# Patient Record
Sex: Female | Born: 1950 | Race: White | Hispanic: No | Marital: Married | Smoking: Never smoker
Health system: Southern US, Community
[De-identification: ages and names within clinical notes are randomized; demographics above are authoritative.]

## PROBLEM LIST (undated history)

## (undated) DIAGNOSIS — F32A Depression, unspecified: Secondary | ICD-10-CM

## (undated) DIAGNOSIS — T8859XA Other complications of anesthesia, initial encounter: Secondary | ICD-10-CM

## (undated) DIAGNOSIS — M1990 Unspecified osteoarthritis, unspecified site: Secondary | ICD-10-CM

## (undated) DIAGNOSIS — K219 Gastro-esophageal reflux disease without esophagitis: Secondary | ICD-10-CM

## (undated) DIAGNOSIS — G9389 Other specified disorders of brain: Secondary | ICD-10-CM

## (undated) DIAGNOSIS — J189 Pneumonia, unspecified organism: Secondary | ICD-10-CM

## (undated) DIAGNOSIS — E785 Hyperlipidemia, unspecified: Secondary | ICD-10-CM

## (undated) DIAGNOSIS — R51 Headache: Secondary | ICD-10-CM

## (undated) DIAGNOSIS — M549 Dorsalgia, unspecified: Secondary | ICD-10-CM

## (undated) DIAGNOSIS — M797 Fibromyalgia: Secondary | ICD-10-CM

## (undated) DIAGNOSIS — R519 Headache, unspecified: Secondary | ICD-10-CM

## (undated) DIAGNOSIS — I10 Essential (primary) hypertension: Secondary | ICD-10-CM

## (undated) DIAGNOSIS — K5190 Ulcerative colitis, unspecified, without complications: Secondary | ICD-10-CM

## (undated) DIAGNOSIS — F329 Major depressive disorder, single episode, unspecified: Secondary | ICD-10-CM

## (undated) DIAGNOSIS — T4145XA Adverse effect of unspecified anesthetic, initial encounter: Secondary | ICD-10-CM

## (undated) DIAGNOSIS — G8929 Other chronic pain: Secondary | ICD-10-CM

## (undated) DIAGNOSIS — E890 Postprocedural hypothyroidism: Secondary | ICD-10-CM

## (undated) HISTORY — PX: WISDOM TOOTH EXTRACTION: SHX21

## (undated) HISTORY — PX: REDUCTION MAMMAPLASTY: SUR839

## (undated) HISTORY — DX: Essential (primary) hypertension: I10

## (undated) HISTORY — DX: Ulcerative colitis, unspecified, without complications: K51.90

## (undated) HISTORY — PX: UTERINE SUSPENSION: SUR1430

## (undated) HISTORY — DX: Fibromyalgia: M79.7

## (undated) HISTORY — PX: APPENDECTOMY: SHX54

## (undated) HISTORY — DX: Postprocedural hypothyroidism: E89.0

## (undated) HISTORY — DX: Unspecified osteoarthritis, unspecified site: M19.90

## (undated) HISTORY — DX: Hyperlipidemia, unspecified: E78.5

## (undated) HISTORY — PX: COLONOSCOPY W/ BIOPSIES AND POLYPECTOMY: SHX1376

## (undated) HISTORY — DX: Gastro-esophageal reflux disease without esophagitis: K21.9

## (undated) HISTORY — DX: Other specified disorders of brain: G93.89

## (undated) HISTORY — PX: TONSILLECTOMY: SUR1361

## (undated) HISTORY — PX: BREAST REDUCTION SURGERY: SHX8

## (undated) HISTORY — PX: LUMBAR DISC SURGERY: SHX700

---

## 1974-06-22 DIAGNOSIS — K5190 Ulcerative colitis, unspecified, without complications: Secondary | ICD-10-CM

## 1974-06-22 HISTORY — DX: Ulcerative colitis, unspecified, without complications: K51.90

## 2005-06-22 HISTORY — PX: BREAST REDUCTION SURGERY: SHX8

## 2013-07-19 ENCOUNTER — Ambulatory Visit (INDEPENDENT_AMBULATORY_CARE_PROVIDER_SITE_OTHER): Payer: 59 | Admitting: Family Medicine

## 2013-07-19 ENCOUNTER — Encounter: Payer: Self-pay | Admitting: Family Medicine

## 2013-07-19 VITALS — BP 160/85 | HR 78 | Temp 99.1°F | Ht 60.0 in | Wt 145.0 lb

## 2013-07-19 DIAGNOSIS — R5383 Other fatigue: Secondary | ICD-10-CM

## 2013-07-19 DIAGNOSIS — K5190 Ulcerative colitis, unspecified, without complications: Secondary | ICD-10-CM

## 2013-07-19 DIAGNOSIS — F430 Acute stress reaction: Secondary | ICD-10-CM

## 2013-07-19 DIAGNOSIS — B070 Plantar wart: Secondary | ICD-10-CM

## 2013-07-19 DIAGNOSIS — E039 Hypothyroidism, unspecified: Secondary | ICD-10-CM

## 2013-07-19 DIAGNOSIS — R5381 Other malaise: Secondary | ICD-10-CM

## 2013-07-19 DIAGNOSIS — I10 Essential (primary) hypertension: Secondary | ICD-10-CM | POA: Insufficient documentation

## 2013-07-19 DIAGNOSIS — M797 Fibromyalgia: Secondary | ICD-10-CM

## 2013-07-19 DIAGNOSIS — IMO0001 Reserved for inherently not codable concepts without codable children: Secondary | ICD-10-CM

## 2013-07-19 DIAGNOSIS — E785 Hyperlipidemia, unspecified: Secondary | ICD-10-CM | POA: Insufficient documentation

## 2013-07-19 DIAGNOSIS — E890 Postprocedural hypothyroidism: Secondary | ICD-10-CM

## 2013-07-19 LAB — BASIC METABOLIC PANEL
BUN: 12 mg/dL (ref 6–23)
CO2: 33 mEq/L — ABNORMAL HIGH (ref 19–32)
Calcium: 9.7 mg/dL (ref 8.4–10.5)
Chloride: 99 mEq/L (ref 96–112)
Creat: 0.6 mg/dL (ref 0.50–1.10)
Glucose, Bld: 79 mg/dL (ref 70–99)
Potassium: 3.8 mEq/L (ref 3.5–5.3)
SODIUM: 138 meq/L (ref 135–145)

## 2013-07-19 NOTE — Assessment & Plan Note (Signed)
Obtaining TSH today.

## 2013-07-19 NOTE — Patient Instructions (Signed)
It was nice to see you today.  Please schedule an appt. Regarding your warts.  Please be sure to take Calcium (1200 mg) and Vitamin D (800 IU) daily.

## 2013-07-19 NOTE — Assessment & Plan Note (Signed)
PHQ-9 = 8. Recent stressor with child being ill and recent relocation. Not impacting function/relationships. Will continue to monitor closely.

## 2013-07-19 NOTE — Assessment & Plan Note (Signed)
No treatment currently. Obtaining fasting lipid panel.

## 2013-07-19 NOTE — Assessment & Plan Note (Signed)
Patient reports long history of UC. Now symptom free for approximately 1 year. Patient to see GI later this month.

## 2013-07-19 NOTE — Progress Notes (Signed)
   Subjective:    Patient ID: Erika Osborn, female    DOB: [birth year], 63 y.o.   MRN: [id number]  HPI Erika Osborn is a 63 year old female who presents to establish care.  Current complaints: Depression/feeling down, Plantar warts.  1) Feeling Down, ? Depression - Reports feeling "stressed" and down since relocating to the area.  - She reports fatigue/feeling tired, and difficulty sleeping.   - She reports that he daughter has also recently been sick, and this has been troubling her. - No interference with daily activities/relationships. - She states "I'm dealing with it"  2) Plantar warts - Patient reports she has 3-4 plantar warts which are now bothering her quite a bit. - She reports that she's had this in the past before; previously treated with "burning" and laser treatment. - She reports that they're now bothersome particularly with ambulation.  PMH, Surgical Hx, Social Hx, Family Hx, Meds/Allergies reviewed (see below). Past Medical History  Diagnosis Date  . Arthritis   . GERD (gastroesophageal reflux disease)   . HTN (hypertension)   . HLD (hyperlipidemia)   . Fibromyalgia   . Hypothyroidism following radioiodine therapy   . Ulcerative colitis 1976   History   Social History  . Marital Status: Married    Spouse Name: N/A    Number of Children: N/A  . Years of Education: N/A   Occupational History  . Unemployed    Social History Main Topics  . Smoking status: Never Smoker   . Smokeless tobacco: Not on file  . Alcohol Use: No  . Drug Use: No  . Sexual Activity: Yes    Partners: Male     Comment: Married.   Other Topics Concern  . Not on file   Social History Narrative  . No narrative on file   Family History  Problem Relation Age of Onset  . Cancer Father   . Heart failure Mother   . Hyperlipidemia Mother   . Hyperlipidemia Brother   . Hyperlipidemia Sister   . Osteoporosis Mother    Current outpatient prescriptions:cetirizine (ZYRTEC) 10 MG  tablet, Take 10 mg by mouth daily., Disp: , Rfl: ;  levothyroxine (SYNTHROID, LEVOTHROID) 100 MCG tablet, Take 100 mcg by mouth daily., Disp: , Rfl: ;  quinapril-hydrochlorothiazide (ACCURETIC) 20-12.5 MG per tablet, Take 1 tablet by mouth daily., Disp: , Rfl: ;  ranitidine (ZANTAC) 150 MG capsule, Take 150 mg by mouth daily., Disp: , Rfl:  traMADol (ULTRAM) 50 MG tablet, Take 50-100 mg by mouth every 6 (six) hours as needed., Disp: , Rfl:   Allergies  Allergen Reactions  . Penicillins   . Remicade [Infliximab]    Review of Systems Reports stress, muscle cramps/aches, joint pain, nausea    Objective:   Physical Exam Exam: General: well appearing female in NAD.  Cardiovascular: RRR. No murmurs, rubs, or gallops. Respiratory: CTAB. No rales, rhonchi, or wheeze. Abdomen: soft, nontender, nondistended. Extremities: No LE edema. Crepitus with knee extension. Skin: 3 plantar warts noted (2 on Left, 1 on right foot)    Assessment & Plan:  See Problem List

## 2013-07-19 NOTE — Assessment & Plan Note (Signed)
Patient to return for cryo treatment.

## 2013-07-19 NOTE — Assessment & Plan Note (Signed)
BP elevated today @ 160/85. Refilled home medication - Quinapril-HCTZ May need to increase or add on therapy if pressure remains elevated.

## 2013-07-20 ENCOUNTER — Encounter: Payer: Self-pay | Admitting: Family Medicine

## 2013-07-20 LAB — CBC
HCT: 38.2 % (ref 36.0–46.0)
Hemoglobin: 12.9 g/dL (ref 12.0–15.0)
MCH: 31.5 pg (ref 26.0–34.0)
MCHC: 33.8 g/dL (ref 30.0–36.0)
MCV: 93.2 fL (ref 78.0–100.0)
Platelets: 289 10*3/uL (ref 150–400)
RBC: 4.1 MIL/uL (ref 3.87–5.11)
RDW: 13.6 % (ref 11.5–15.5)
WBC: 5.1 10*3/uL (ref 4.0–10.5)

## 2013-07-20 LAB — TSH: TSH: 2.208 u[IU]/mL (ref 0.350–4.500)

## 2013-07-24 ENCOUNTER — Other Ambulatory Visit: Payer: 59

## 2013-07-24 DIAGNOSIS — E785 Hyperlipidemia, unspecified: Secondary | ICD-10-CM

## 2013-07-24 LAB — LIPID PANEL
Cholesterol: 284 mg/dL — ABNORMAL HIGH (ref 0–200)
HDL: 53 mg/dL (ref >39)
LDL CALC: 198 mg/dL — AB (ref 0–99)
Total CHOL/HDL Ratio: 5.4 Ratio
Triglycerides: 164 mg/dL — ABNORMAL HIGH (ref <150)
VLDL: 33 mg/dL (ref 0–40)

## 2013-07-24 NOTE — Progress Notes (Signed)
[place]

## 2013-07-27 ENCOUNTER — Encounter: Payer: Self-pay | Admitting: Family Medicine

## 2013-08-01 ENCOUNTER — Encounter: Payer: Self-pay | Admitting: Family Medicine

## 2013-08-01 ENCOUNTER — Ambulatory Visit (INDEPENDENT_AMBULATORY_CARE_PROVIDER_SITE_OTHER): Payer: 59 | Admitting: Family Medicine

## 2013-08-01 VITALS — BP 154/70 | HR 75 | Temp 98.7°F | Wt 146.0 lb

## 2013-08-01 DIAGNOSIS — B070 Plantar wart: Secondary | ICD-10-CM

## 2013-08-01 NOTE — Progress Notes (Addendum)
   Subjective:    Patient ID: Cloa Bushong, female    DOB: 02/16/1951, 63 y.o.   MRN: [id number]  HPI Mrs. Standlee presents today for plantar wart removal. - Warts have been present for months and are causing pain with ambulation. - She desires removal today.  Review of Systems Per HPI    Objective:   Physical Exam Filed Vitals:   08/01/13 1329  BP: 154/70  Pulse: 75  Temp: 98.7 F (37.1 C)   Skin/Extremities: 2 plantar warts with overlying callus noted on the left foot (ball of the foot).  1 plantar wart with overlying callus noted.    Assessment & Plan:  Patient counseled on the risks and benefits of the procedure. Consent formed signed. Area prepped with alcohol. Scalpel (#11 & #15) as well as a small curette was used to remove overlying callus and get down to underlying normal tissue.  This was done for all 3 plantar warts (2 on left foot and 1 on the right). Cryogun was then used to freeze the affected areas (done for 3 secs x 2 for each affected area). Patient tolerated the procedure well with minimal pain.

## 2013-08-01 NOTE — Addendum Note (Signed)
Addended by: Coral Spikes on: 08/01/2013 02:25 PM   Modules accepted: Level of Service

## 2013-09-10 ENCOUNTER — Other Ambulatory Visit: Payer: Self-pay | Admitting: Family Medicine

## 2013-10-13 ENCOUNTER — Telehealth: Payer: Self-pay | Admitting: Family Medicine

## 2013-10-13 MED ORDER — TRAMADOL HCL 50 MG PO TABS: 50.0000 mg | ORAL_TABLET | Freq: Four times a day (QID) | ORAL | Status: DC | PRN

## 2013-10-13 NOTE — Telephone Encounter (Signed)
Needs refill on tramadol phamary- [facility] [address]

## 2013-10-13 NOTE — Telephone Encounter (Signed)
Rx is up front ready for pick up.

## 2013-10-16 ENCOUNTER — Other Ambulatory Visit: Payer: Self-pay | Admitting: Family Medicine

## 2013-10-16 NOTE — Telephone Encounter (Signed)
Pt called and needs a refill on her Tramadol left up front for pickup. jw

## 2013-10-17 ENCOUNTER — Other Ambulatory Visit: Payer: Self-pay | Admitting: Family Medicine

## 2013-10-17 MED ORDER — TRAMADOL HCL 50 MG PO TABS: 50.0000 mg | ORAL_TABLET | Freq: Four times a day (QID) | ORAL | Status: DC | PRN

## 2013-10-18 ENCOUNTER — Other Ambulatory Visit: Payer: Self-pay | Admitting: Family Medicine

## 2013-10-18 MED ORDER — TRAMADOL HCL 50 MG PO TABS: 100.0000 mg | ORAL_TABLET | Freq: Four times a day (QID) | ORAL | Status: DC | PRN

## 2013-10-19 ENCOUNTER — Telehealth: Payer: Self-pay | Admitting: Family Medicine

## 2013-10-19 NOTE — Telephone Encounter (Signed)
Pt called because the pharmacy did not fill the prescription correctly. They said it is 1 tablet every 6 hours as needed and they would only fill it at 100 qty. The prescription should be 2 tablets every 6 hours or 4 times a day. Can we call and fix this or does she have to pick up a new prescription. jw

## 2013-10-22 NOTE — Telephone Encounter (Signed)
A recent Rx for the correct amount was placed up front. She should not need a new prescription because I provided a correct one.

## 2013-10-23 NOTE — Telephone Encounter (Signed)
Relayed message,patient states she already picked it up.[place]

## 2014-01-05 ENCOUNTER — Other Ambulatory Visit: Payer: Self-pay | Admitting: Family Medicine

## 2014-01-17 ENCOUNTER — Ambulatory Visit (INDEPENDENT_AMBULATORY_CARE_PROVIDER_SITE_OTHER): Payer: 59 | Admitting: Family Medicine

## 2014-01-17 VITALS — BP 143/74 | HR 83 | Temp 98.8°F | Ht 60.0 in | Wt 145.0 lb

## 2014-01-17 DIAGNOSIS — J309 Allergic rhinitis, unspecified: Secondary | ICD-10-CM

## 2014-01-17 DIAGNOSIS — E890 Postprocedural hypothyroidism: Secondary | ICD-10-CM

## 2014-01-17 DIAGNOSIS — M25512 Pain in left shoulder: Secondary | ICD-10-CM

## 2014-01-17 DIAGNOSIS — H1013 Acute atopic conjunctivitis, bilateral: Secondary | ICD-10-CM

## 2014-01-17 DIAGNOSIS — H1010 Acute atopic conjunctivitis, unspecified eye: Secondary | ICD-10-CM | POA: Insufficient documentation

## 2014-01-17 DIAGNOSIS — M25519 Pain in unspecified shoulder: Secondary | ICD-10-CM

## 2014-01-17 MED ORDER — FLUTICASONE PROPIONATE 50 MCG/ACT NA SUSP: 2.0000 | Freq: Every day | NASAL | Status: DC

## 2014-01-17 MED ORDER — METHYLPREDNISOLONE ACETATE 40 MG/ML IJ SUSP
40.0000 mg | Freq: Once | INTRAMUSCULAR | Status: AC
  Administered 2014-01-17: 40 mg via INTRA_ARTICULAR

## 2014-01-17 MED ORDER — OLOPATADINE HCL 0.2 % OP SOLN: 1.0000 [drp] | Freq: Every day | OPHTHALMIC | Status: DC

## 2014-01-17 NOTE — Assessment & Plan Note (Signed)
Symptoms and physical exam consistent with rotator cuff tendinopathy. Injection performed today. Patient to follow up closely w/ me.  If fails to have significant improvement will likely need MSK US/PT and potentially MRI.

## 2014-01-17 NOTE — Assessment & Plan Note (Signed)
Rechecking TSH today.

## 2014-01-17 NOTE — Progress Notes (Signed)
   Subjective:    Patient ID: Erika Osborn, female    DOB: 1950/07/01, 63 y.o.   MRN: [id number]  HPI 63 year old female with HTN, HLD, & Hypothyroidism post iodine ablation presents for follow up.  Concerns today:  1) Allergies - Patient reports that she has been bothered by allergies since Friday (~5 days). - Symptoms: itchy watery eyes, runny nose. - She takes daily Zyrtec but this has not resolved her symptoms. She has also used Benadryl with a mild improvement. - No recent fever, chills, cough, SOB.  2) Hypothyroidism - Patient noted increasing fatigue and trouble sleeping. - She states that she "doesn't feel quite right" - Desires TSH today.  Other symptoms: - Weight changes: patient reports weight gain (no real change in documented clinic weights). - Skin Changes: None  - Palpitations: None  - Heat/Cold intolerance: + Cold intoleranc  Medication - Currently on Synthroid 100 mcg Compliance:  Yes  Last TSH:   Lab Results  Component Value Date   TSH 2.208 07/19/2013   3) Shoulder Pain - Left - Patient reports a prior hx of shoulder pain. Underwent PT last time this occurred. - She states she has had pain for quite some time (weeks to months) - Pain is moderate to severe and located diffusely around the shoulder.  Worse at night and with overhead activity. - No recent fall/trauma/injury. - No relieving factors.  - No interventions tried.  Social Hx: History   Social History  . Marital Status: Married    Spouse Name: N/A    Number of Children: N/A  . Years of Education: N/A   Occupational History  . Unemployed    Social History Main Topics  . Smoking status: Never Smoker   . Smokeless tobacco: Not on file  . Alcohol Use: No  . Drug Use: No  . Sexual Activity: Yes    Partners: Male     Comment: Married.   Other Topics Concern  . Not on file   Social History Narrative  . No narrative on file   Review of Systems Per HPI    Objective:   Physical  Exam Filed Vitals:   01/17/14 1438  BP: 143/74  Pulse: 83  Temp: 98.8 F (37.1 C)   Exam: General: well appearing, NAD. Neck: No palpable thyromegaly. Cardiovascular: RRR. No murmurs, rubs, or gallops. Respiratory: CTAB. No rales, rhonchi, or wheeze. Extremities: No LE edema. Skin: Warm, dry, intact. No rash.  MSK Shoulder: Left Inspection reveals no abnormalities, atrophy or asymmetry. Palpation is normal with no tenderness over AC joint or bicipital groove. ROM - significant decreased in flexion (lacking ~ 20 degrees). Rotator cuff strength - 4/5 Supraspinatus. Rest = normal. Negative Empty can. + Hawkins. + Painful arc.  Consent obtained and verified. Time-out conducted. Noted no overlying erythema, induration, or other signs of local infection. Skin prepped in a sterile fashion. Topical analgesic spray: Ethyl chloride. Joint: L shoulder Needle: 21 g; 1 1/2 inch Completed without difficulty. Meds: 1 cc Depo medrol, 4 cc Lidocaine 1%      Assessment & Plan:  See Problem List

## 2014-01-17 NOTE — Patient Instructions (Signed)
Nice to see you today.  Below is what we discussed today.  1) Allergies - I have prescribed a eye drop as well as a nasal steroid for your symptoms.  2) Thyroid - TSH today.  3) Shoulder pain - Injection today. - Frequent stretching. - Follow up if worsens or does not improve.  Follow up in ~ 3-6 months or sooner if needed.

## 2014-01-17 NOTE — Assessment & Plan Note (Signed)
Will treat with Pataday and Flonase. Continue Zyrtec.

## 2014-01-18 ENCOUNTER — Encounter: Payer: Self-pay | Admitting: Family Medicine

## 2014-01-18 LAB — TSH: TSH: 1.048 u[IU]/mL (ref 0.350–4.500)

## 2014-03-17 ENCOUNTER — Other Ambulatory Visit: Payer: Self-pay | Admitting: Family Medicine

## 2014-03-21 NOTE — Telephone Encounter (Signed)
Pt called and needs her refill on Tramadol faxed over to her pharmacy today before 8 pm. She is going out of town and really needs this. jw

## 2014-04-03 ENCOUNTER — Ambulatory Visit (INDEPENDENT_AMBULATORY_CARE_PROVIDER_SITE_OTHER): Payer: 59 | Admitting: *Deleted

## 2014-04-03 ENCOUNTER — Other Ambulatory Visit: Payer: Self-pay | Admitting: Family Medicine

## 2014-04-03 DIAGNOSIS — Z23 Encounter for immunization: Secondary | ICD-10-CM

## 2014-04-03 MED ORDER — ZOSTER VACCINE LIVE 19400 UNT/0.65ML ~~LOC~~ SOLR: 0.6500 mL | Freq: Once | SUBCUTANEOUS | Status: DC

## 2014-04-25 ENCOUNTER — Other Ambulatory Visit: Payer: Self-pay | Admitting: Family Medicine

## 2014-04-25 NOTE — Telephone Encounter (Signed)
Need refill on tramadol med.  [facility]

## 2014-04-26 MED ORDER — TRAMADOL HCL 50 MG PO TABS: ORAL_TABLET | ORAL | Status: DC

## 2014-04-26 NOTE — Telephone Encounter (Signed)
Informed that rx is up front for pick up

## 2014-04-26 NOTE — Telephone Encounter (Signed)
Rx ready to be picked up.

## 2014-05-06 ENCOUNTER — Other Ambulatory Visit: Payer: Self-pay | Admitting: Family Medicine

## 2014-05-09 ENCOUNTER — Other Ambulatory Visit: Payer: Self-pay | Admitting: Family Medicine

## 2014-05-31 ENCOUNTER — Other Ambulatory Visit: Payer: Self-pay | Admitting: Family Medicine

## 2014-06-04 ENCOUNTER — Other Ambulatory Visit: Payer: Self-pay | Admitting: Family Medicine

## 2014-06-05 ENCOUNTER — Other Ambulatory Visit: Payer: Self-pay | Admitting: Family Medicine

## 2014-07-11 ENCOUNTER — Other Ambulatory Visit: Payer: Self-pay | Admitting: Family Medicine

## 2014-09-06 ENCOUNTER — Other Ambulatory Visit: Payer: Self-pay | Admitting: Family Medicine

## 2014-09-06 MED ORDER — LEVOTHYROXINE SODIUM 100 MCG PO TABS: 100.0000 ug | ORAL_TABLET | Freq: Every day | ORAL | Status: DC

## 2014-09-06 NOTE — Telephone Encounter (Signed)
Patient [facility]  Patient only have 2 tabs left.  Need to have refilled by tomorrow if possible.

## 2015-01-08 ENCOUNTER — Ambulatory Visit (INDEPENDENT_AMBULATORY_CARE_PROVIDER_SITE_OTHER): Payer: 59 | Admitting: Family Medicine

## 2015-01-08 VITALS — BP 132/80 | HR 80 | Temp 98.1°F | Ht 60.0 in | Wt 145.1 lb

## 2015-01-08 DIAGNOSIS — H6120 Impacted cerumen, unspecified ear: Secondary | ICD-10-CM | POA: Insufficient documentation

## 2015-01-08 DIAGNOSIS — R0981 Nasal congestion: Secondary | ICD-10-CM | POA: Diagnosis not present

## 2015-01-08 DIAGNOSIS — H6123 Impacted cerumen, bilateral: Secondary | ICD-10-CM

## 2015-01-08 MED ORDER — FLUTICASONE PROPIONATE 50 MCG/ACT NA SUSP: 2.0000 | Freq: Every day | NASAL | Status: DC

## 2015-01-08 MED ORDER — CARBAMIDE PEROXIDE 6.5 % OT SOLN: 5.0000 [drp] | Freq: Two times a day (BID) | OTIC | Status: DC

## 2015-01-08 NOTE — Assessment & Plan Note (Signed)
Unable to visualize TM b/l even after washing out. Advised against Q tips. She places ear plugs in her ears at night since her husband snores.  - written for debrox  - f/u if hearing worsens.

## 2015-01-08 NOTE — Progress Notes (Signed)
   Subjective:    Patient ID: Erika Osborn, female    DOB: April 20, 1951, 64 y.o.   MRN: [id number]  Seen for Same day visit for   CC: sinus congestion   She is having gradual hearing loss on right, headaches, congestion and rhinorrhea.  Has had symptom for 3 weeks. . Seasonal symptoms: yes  Nasal discharge: no Medications tried: zyrtec   Symptoms Sneezing: some  Scratchy throat: no Fever: no Headache or face pain: yes  Tooth pain: yes  Sore throat or swollen glands: some  Severe fatigue: mild fatigue  Stiff neck: no Shortness of breath: no Rash: no  Smoking Status noted  Review of Systems   See HPI for ROS. Objective:  BP 132/80 mmHg  Pulse 80  Temp(Src) 98.1 F (36.7 C) (Oral)  Ht 5' (1.524 m)  Wt 145 lb 1.6 oz (65.817 kg)  BMI 28.34 kg/m2  General: NAD HEENT: no tonsillar exudates, clear conjunctiva, TM unable to be visualized even after flushing, no LAD, uvula midline, Cardiac: RRR, normal heart sounds, no murmurs.  Respiratory: CTAB, normal effort     Assessment & Plan:  See Problem List Documentation

## 2015-01-08 NOTE — Patient Instructions (Signed)
Thank you for coming in,   Try the debrox for both ears. Don't use a Q-tip.   Continue to use flonase and zyrtec. If you symptoms don't resolve in two weeks then please return.   Capsaicin cream can be used over your joints. This can be bought at the pharmacy.   Please bring all of your medications with you to each visit.    Please feel free to call with any questions or concerns at any time, at [phone]. --Dr. Raeford Razor

## 2015-01-08 NOTE — Assessment & Plan Note (Signed)
Possible related to allergic rhinitis. Intermittent use of zyrtec and flonase.  - recommended daily flonase and zyrtec  - if develop fevers or worsens then f/u and provider if ABX are needed.

## 2015-01-11 ENCOUNTER — Other Ambulatory Visit: Payer: Self-pay | Admitting: Family Medicine

## 2015-01-16 ENCOUNTER — Other Ambulatory Visit: Payer: Self-pay | Admitting: Family Medicine

## 2015-01-17 NOTE — Telephone Encounter (Signed)
Patient needs to be seen for follow-up of pain issue for tramadol refill.  She has not been seen (except for unrelated same day appt) in 1 year.  Please call and schedule patient for an appt with me or another provider.  Virginia Crews, MD, MPH PGY-2,  [facility] 01/17/2015 9:38 AM

## 2015-01-18 ENCOUNTER — Encounter: Payer: Self-pay | Admitting: Family Medicine

## 2015-01-18 ENCOUNTER — Ambulatory Visit (INDEPENDENT_AMBULATORY_CARE_PROVIDER_SITE_OTHER): Payer: 59 | Admitting: Family Medicine

## 2015-01-18 VITALS — BP 145/77 | HR 89 | Temp 98.6°F | Wt 145.0 lb

## 2015-01-18 DIAGNOSIS — M797 Fibromyalgia: Secondary | ICD-10-CM

## 2015-01-18 MED ORDER — TRAMADOL HCL 50 MG PO TABS: 100.0000 mg | ORAL_TABLET | Freq: Four times a day (QID) | ORAL | Status: DC | PRN

## 2015-01-18 NOTE — Assessment & Plan Note (Signed)
Stable Refilled tramadol 50 mg #240 with 5 refills Follow-up in 6 months

## 2015-01-18 NOTE — Patient Instructions (Signed)
Nice to meet you today.  See you soon for Hypothyroid and cholesterol follow-up.  Take care, Dr. Jacinto Reap

## 2015-01-18 NOTE — Progress Notes (Signed)
   Subjective:   Erika Osborn is a 64 y.o. female with a history of fibromyalgia here for f/u and medication refill  Fibromyalgia, OA: - taking Tramadol 117m q6h prn - taking ~TID - legs hurt badly and doesn't function well without Tramadol - has been taking for ~9 yrs - usually gets quantity of 240 pills per month - reports pain is well controlled with medication  Review of Systems:  Per HPI. All other systems reviewed and are negative.   PMH, PSH, Medications, Allergies, and FmHx reviewed and updated in EMR.  Social History: non smoker  Objective:  BP 145/77 mmHg  Pulse 89  Temp(Src) 98.6 F (37 C) (Oral)  Wt 145 lb (65.772 kg)  Gen:  64y.o. female in NAD HEENT: NCAT, MMM, EOMI CV: RRR, no MRG Resp: Non-labored, CTAB, no wheezes noted Ext: WWP, no edema MSK: Limited extension of b/l knees, muscular tenderness diffusely Neuro: Alert and oriented, speech normal    Assessment:     Erika Feeleyis a 64y.o. female here for fibromyalgia.    Plan:     See problem list for problem-specific plans.   Erika Crews MD PGY-2,  [facility] 01/18/2015  1:36 PM

## 2015-01-30 ENCOUNTER — Ambulatory Visit: Payer: 59 | Admitting: Family Medicine

## 2015-02-13 ENCOUNTER — Ambulatory Visit (INDEPENDENT_AMBULATORY_CARE_PROVIDER_SITE_OTHER): Payer: 59 | Admitting: Family Medicine

## 2015-02-13 ENCOUNTER — Encounter: Payer: Self-pay | Admitting: Family Medicine

## 2015-02-13 VITALS — BP 141/81 | HR 86 | Temp 98.0°F | Ht 60.0 in | Wt 145.2 lb

## 2015-02-13 DIAGNOSIS — M7502 Adhesive capsulitis of left shoulder: Secondary | ICD-10-CM | POA: Diagnosis not present

## 2015-02-13 DIAGNOSIS — M7500 Adhesive capsulitis of unspecified shoulder: Secondary | ICD-10-CM | POA: Insufficient documentation

## 2015-02-13 DIAGNOSIS — J309 Allergic rhinitis, unspecified: Secondary | ICD-10-CM

## 2015-02-13 DIAGNOSIS — E890 Postprocedural hypothyroidism: Secondary | ICD-10-CM

## 2015-02-13 DIAGNOSIS — H6123 Impacted cerumen, bilateral: Secondary | ICD-10-CM

## 2015-02-13 DIAGNOSIS — M797 Fibromyalgia: Secondary | ICD-10-CM | POA: Diagnosis not present

## 2015-02-13 DIAGNOSIS — H1013 Acute atopic conjunctivitis, bilateral: Secondary | ICD-10-CM

## 2015-02-13 LAB — TSH: TSH: 0.702 u[IU]/mL (ref 0.350–4.500)

## 2015-02-13 MED ORDER — GABAPENTIN 100 MG PO CAPS: 100.0000 mg | ORAL_CAPSULE | Freq: Three times a day (TID) | ORAL | Status: DC

## 2015-02-13 MED ORDER — METHYLPREDNISOLONE ACETATE 40 MG/ML IJ SUSP
40.0000 mg | Freq: Once | INTRAMUSCULAR | Status: AC
  Administered 2015-02-13: 40 mg via INTRA_ARTICULAR

## 2015-02-13 NOTE — Progress Notes (Signed)
   Subjective:   Erika Osborn is a 64 y.o. female with a history of HTN, ulcerative colitis, hypothyroidism, fibromyalgia, allergic rhinitis here for hypothyroidism, cold like symptoms, pain all over.  Hypothyroidism s/p radiation therapy for hyperparathyroidism - reports compliance with Synthroid 165mg daily - Feeling "run down" and tired - Weight changes: patient reports weight gain (no real change in documented clinic weights). - Skin Changes: notices more bumps that come and go - Palpitations: None  - Heat/Cold intolerance: + Cold intolerance - would like to see a dietician to help with weight gain  Allergies vs cold like symptoms - started 2-3 days ago - itchy throat, R ear pain, stuffy nose - Takes Zyrtec daily - daughter and husband had similar symptoms  Pain - has fibromyalgia and OA - taking Tramadol and tylenol arthritis for pain control - not really helping - reports pain from top of head to tip of toes - constant ache - would like referral to back to Rheumatology possibly - reports that she will not take gabapentin or Lyrica because she has seen them on TV - but thinks she may try if it is not like Humira or other biologics  Review of Systems:  Per HPI. All other systems reviewed and are negative.   PMH, PSH, Medications, Allergies, and FmHx reviewed and updated in EMR.  Social History: non smoker  Objective:  BP 141/81 mmHg  Pulse 86  Temp(Src) 98 F (36.7 C) (Oral)  Ht 5' (1.524 m)  Wt 145 lb 3.2 oz (65.862 kg)  BMI 28.36 kg/m2  Gen:  64y.o. female in NAD HEENT: NCAT, MMM, EOMI, PERRL, anicteric sclerae, OP clear, TMs unable to be visualized secondary to cerumen impaction CV: RRR, no MRG, intact distal pulses Resp: Non-labored, CTAB, no wheezes noted Abd: Soft, NTND, BS present, no guarding or organomegaly Ext: WWP, no edema MSK: Full ROM, strength intact, crepitus noted in bilateral knees, TTP over large muscle groups and diffusely over joints.  Left shoulder with limited abduction and flexion and pain with external rotation Neuro: Alert and oriented, speech normal     Assessment:     Erika Osborn a 64y.o. female here for hypothyroidism, cold-like symptoms, pain.    Plan:     See problem list for problem-specific plans.   [name] MD PGY-2,  [facility] 02/13/2015  2:21 PM

## 2015-02-13 NOTE — Patient Instructions (Signed)
Nice to see you again today.  I will refer you to physical therapy and the dietitian. Someone will call you about these appointments when they're available. We'll check her TSH today and someone will call you or send you a letter with this result on this available. These are taking gabapentin 100 mg 3 times a day for fibromyalgia. I would like to see you back in about a month and we can titrate this dose up as tolerated.  Take care, Dr. Jacinto Reap

## 2015-02-14 ENCOUNTER — Encounter: Payer: Self-pay | Admitting: Family Medicine

## 2015-02-14 NOTE — Assessment & Plan Note (Signed)
Pain consistent with fibromyalgia as it is diffuse and not localized to any one area Start gabapentin 100 mg 3 times a day Follow-up in one month with plan to titrate up on gabapentin Consider rheumatology referral if not improving on gabapentin

## 2015-02-14 NOTE — Assessment & Plan Note (Signed)
Subacromial steroidal injection performed in left shoulder in clinic today Referral to physical therapy

## 2015-02-14 NOTE — Assessment & Plan Note (Signed)
Symptoms currently consistent with viral URI or allergic rhinitis Continue Flonase and Zyrtec Return precautions given

## 2015-02-14 NOTE — Assessment & Plan Note (Signed)
Recheck TSH today Continue Synthroid 100 mcg daily pending TSH Follow-up in 3 months

## 2015-02-14 NOTE — Assessment & Plan Note (Signed)
Patient does not want ears clean on clinic Advised her to try OTC drops

## 2015-03-08 ENCOUNTER — Encounter: Payer: Self-pay | Admitting: Family Medicine

## 2015-03-08 ENCOUNTER — Ambulatory Visit (INDEPENDENT_AMBULATORY_CARE_PROVIDER_SITE_OTHER): Payer: 59 | Admitting: Family Medicine

## 2015-03-08 VITALS — BP 148/89 | HR 87 | Temp 98.1°F | Ht 60.0 in | Wt 147.4 lb

## 2015-03-08 DIAGNOSIS — Z0000 Encounter for general adult medical examination without abnormal findings: Secondary | ICD-10-CM | POA: Diagnosis not present

## 2015-03-08 DIAGNOSIS — M7502 Adhesive capsulitis of left shoulder: Secondary | ICD-10-CM | POA: Diagnosis not present

## 2015-03-08 DIAGNOSIS — M797 Fibromyalgia: Secondary | ICD-10-CM

## 2015-03-08 NOTE — Assessment & Plan Note (Signed)
Pain improved greatly after starting gabapentin Continue gabapentin 100 mg 3 times a day Follow-up as needed Consider increasing bedtime dose of gabapentin to 300 mg if needed

## 2015-03-08 NOTE — Assessment & Plan Note (Signed)
Patient planning to get flu shot after vacation in 2 weeks We'll request records from previous PCP in [place] Follow-up in about 2 months for CPE

## 2015-03-08 NOTE — Progress Notes (Signed)
   Subjective:   Chiniqua Kilcrease is a 64 y.o. female with a history of HTN, hypothyroidism, fibromyalgia here for fibromyalgia and frozen shoulder f/u.  Fibromyalgia - started gabapentin 170m TID at last visit 02/13/15 - patient reports she is doing a lot better - aching is much less severe than before - reports sleepiness occasionally but usually doesn't affect function  L frozen Shoulder - great improvement day after steroid injection at last visit - a little sore with movement, but very much improved - thinks ROM improved - still hurts somewhat to sleep on L side - never called about PT  Review of Systems:  Per HPI. All other systems reviewed and are negative.   PMH, PSH, Medications, Allergies, and FmHx reviewed and updated in EMR.  Social History: non smoker  Objective:  BP 148/89 mmHg  Pulse 87  Temp(Src) 98.1 F (36.7 C) (Oral)  Ht 5' (1.524 m)  Wt 147 lb 6.4 oz (66.86 kg)  BMI 28.79 kg/m2  Gen:  64y.o. female in NAD HEENT: NCAT, MMM CV: RRR, no MRG Resp: Non-labored, CTAB, no wheezes noted Ext: WWP, no edema MSK: L shoulder decreased passive and active ROM of abduction and flexion to 130 degrees, no TTP Neuro: Alert and oriented, speech normal    Assessment & Plan:     JDorissa Stinnetteis a 64y.o. female here for fibromyalgia and frozen shoulder  Frozen shoulder Pain improved with steroid-dependent injection at last visit Range of motion somewhat improved We'll follow up on physical therapy referral  Fibromyalgia Pain improved greatly after starting gabapentin Continue gabapentin 100 mg 3 times a day Follow-up as needed Consider increasing bedtime dose of gabapentin to 300 mg if needed  Healthcare maintenance Patient planning to get flu shot after vacation in 2 weeks [place]       [name] MD MPH PGY-2,  [facility] 03/08/2015  9:31 AM

## 2015-03-08 NOTE — Assessment & Plan Note (Signed)
Pain improved with steroid-dependent injection at last visit Range of motion somewhat improved We'll follow up on physical therapy referral

## 2015-03-08 NOTE — Patient Instructions (Signed)
Nice to see you again today. Continue take your gabapentin for fibromyalgia. I will check on your physical therapy referral and make sure someone calls you early next week about it. Please call Dr. Jenne Campus the nutritionist to make an appointment. We will request records and you will get a mammogram and then I'll see you back for a physical before the end of the year.  Take care, Dr. Jacinto Reap

## 2015-04-18 ENCOUNTER — Ambulatory Visit (INDEPENDENT_AMBULATORY_CARE_PROVIDER_SITE_OTHER): Payer: 59 | Admitting: *Deleted

## 2015-04-18 DIAGNOSIS — Z23 Encounter for immunization: Secondary | ICD-10-CM | POA: Diagnosis not present

## 2015-05-24 ENCOUNTER — Other Ambulatory Visit: Payer: Self-pay

## 2015-05-24 DIAGNOSIS — Z1231 Encounter for screening mammogram for malignant neoplasm of breast: Secondary | ICD-10-CM

## 2015-05-30 ENCOUNTER — Other Ambulatory Visit: Payer: Self-pay | Admitting: *Deleted

## 2015-05-30 MED ORDER — QUINAPRIL-HYDROCHLOROTHIAZIDE 20-12.5 MG PO TABS: 1.0000 | ORAL_TABLET | Freq: Every day | ORAL | Status: DC

## 2015-06-10 ENCOUNTER — Other Ambulatory Visit: Payer: Self-pay | Admitting: Family Medicine

## 2015-06-10 ENCOUNTER — Ambulatory Visit: Payer: 59 | Admitting: Family Medicine

## 2015-06-14 ENCOUNTER — Ambulatory Visit
Admission: RE | Admit: 2015-06-14 | Discharge: 2015-06-14 | Disposition: A | Discharge status: Home / Self Care | Payer: 59 | Source: Ambulatory Visit

## 2015-06-14 DIAGNOSIS — Z1231 Encounter for screening mammogram for malignant neoplasm of breast: Secondary | ICD-10-CM

## 2015-06-25 ENCOUNTER — Other Ambulatory Visit (HOSPITAL_COMMUNITY)
Admission: RE | Admit: 2015-06-25 | Discharge: 2015-06-25 | Disposition: A | Discharge status: Home / Self Care | Payer: 59 | Source: Ambulatory Visit | Attending: Family Medicine | Admitting: Family Medicine

## 2015-06-25 ENCOUNTER — Ambulatory Visit (INDEPENDENT_AMBULATORY_CARE_PROVIDER_SITE_OTHER): Payer: 59 | Admitting: Family Medicine

## 2015-06-25 ENCOUNTER — Encounter: Payer: Self-pay | Admitting: Family Medicine

## 2015-06-25 VITALS — BP 140/66 | HR 78 | Temp 98.3°F | Wt 147.0 lb

## 2015-06-25 DIAGNOSIS — Z1151 Encounter for screening for human papillomavirus (HPV): Secondary | ICD-10-CM | POA: Insufficient documentation

## 2015-06-25 DIAGNOSIS — Z0000 Encounter for general adult medical examination without abnormal findings: Secondary | ICD-10-CM | POA: Diagnosis not present

## 2015-06-25 DIAGNOSIS — Z20828 Contact with and (suspected) exposure to other viral communicable diseases: Secondary | ICD-10-CM

## 2015-06-25 DIAGNOSIS — Z202 Contact with and (suspected) exposure to infections with a predominantly sexual mode of transmission: Secondary | ICD-10-CM | POA: Diagnosis not present

## 2015-06-25 DIAGNOSIS — H1013 Acute atopic conjunctivitis, bilateral: Secondary | ICD-10-CM

## 2015-06-25 DIAGNOSIS — Z124 Encounter for screening for malignant neoplasm of cervix: Secondary | ICD-10-CM

## 2015-06-25 DIAGNOSIS — M7502 Adhesive capsulitis of left shoulder: Secondary | ICD-10-CM | POA: Diagnosis not present

## 2015-06-25 DIAGNOSIS — Z113 Encounter for screening for infections with a predominantly sexual mode of transmission: Secondary | ICD-10-CM | POA: Diagnosis present

## 2015-06-25 DIAGNOSIS — J309 Allergic rhinitis, unspecified: Secondary | ICD-10-CM

## 2015-06-25 DIAGNOSIS — R0981 Nasal congestion: Secondary | ICD-10-CM

## 2015-06-25 DIAGNOSIS — Z01419 Encounter for gynecological examination (general) (routine) without abnormal findings: Secondary | ICD-10-CM | POA: Insufficient documentation

## 2015-06-25 DIAGNOSIS — E663 Overweight: Secondary | ICD-10-CM | POA: Insufficient documentation

## 2015-06-25 LAB — LIPID PANEL
Cholesterol: 260 mg/dL — ABNORMAL HIGH (ref 125–200)
HDL: 42 mg/dL — AB (ref >=46)
LDL Cholesterol: 160 mg/dL — ABNORMAL HIGH (ref <130)
Total CHOL/HDL Ratio: 6.2 Ratio — ABNORMAL HIGH (ref <=5.0)
Triglycerides: 288 mg/dL — ABNORMAL HIGH (ref <150)
VLDL: 58 mg/dL — ABNORMAL HIGH (ref <30)

## 2015-06-25 LAB — POCT GLYCOSYLATED HEMOGLOBIN (HGB A1C): HEMOGLOBIN A1C: 5.5

## 2015-06-25 LAB — POCT WET PREP (WET MOUNT): CLUE CELLS WET PREP WHIFF POC: NEGATIVE

## 2015-06-25 MED ORDER — FLUTICASONE PROPIONATE 50 MCG/ACT NA SUSP: 2.0000 | Freq: Every day | NASAL | Status: DC

## 2015-06-25 NOTE — Progress Notes (Signed)
65 y.o. year old female presents for well woman/preventative visit and annual GYN examination.  Acute Concerns:  L shoulder pain - continues to have pain - now R shoulder hurting - makes it difficult to sleep - never got a call about physical therapy  Diet: avoids red meat, corn, greens due to UC, no fried or spicy foods, no nuts  Exercise: none  Sexual/Birth History: G2P1102, sexually active with husband  Birth Control: none  POA/Living Will: yes, husband  Social:  Social History   Social History  . Marital Status: Married    Spouse Name: N/A  . Number of Children: N/A  . Years of Education: N/A   Occupational History  . Unemployed    Social History Main Topics  . Smoking status: Never Smoker   . Smokeless tobacco: None  . Alcohol Use: No  . Drug Use: No  . Sexual Activity:    Partners: Male     Comment: Married.   Other Topics Concern  . None   Social History Narrative    Immunization: Immunization History  Administered Date(s) Administered  . Influenza,inj,Quad PF,36+ Mos 04/03/2014, 04/18/2015  . Zoster 04/13/2014   Unsure of pneumovax and TDAP - will await records  Cancer Screening:  Pap Smear: been at least 3 years, no known history of abnormal pap smear  Mammogram: 06/14/2015 - normal  Colonoscopy: 05/2015 - reportedly no polyps and mild active colitis  Dexa: Reportedly had DEXA about 8 years ago - will get records    Physical Exam: VITALS:  Filed Vitals:   06/25/15 0935  BP: 140/66  Pulse: 78  Temp: 98.3 F (36.8 C)    GEN: Pleasant female, NAD HEENT: Normocephalic, PERRL, EOMI, no scleral icterus, bilateral TM pearly grey, nasal septum midline, MMM, uvula midline, no anterior or posterior lymphadenopathy, no thyromegaly CARDIAC:RRR, S1 and S2 present, no murmur, no heaves/thrills RESP: CTAB, normal effort ABD: soft, no tenderness, normal bowel sounds GU/GYN:Exam performed in the presence of a chaperone. Normal external genitalia.  Cervix unremarkable. Bimanual exam identified no masses MSK: L and R shoulder with limited abduction and forward flexion EXT: No edema, 2+ radial and DP pulses SKIN: no rash  ASSESSMENT & PLAN: 65 y.o. female presents for annual well woman/preventative exam and GYN exam. Please see problem specific assessment and plan.   Healthcare maintenance Pap collected today Also screening for STDs with HIV, RPR, wet prep, GC chlamydia Screening A1c, lipid panel, CMP collected today Next wellness visit in one year  Overweight (BMI 25.0-29.9) Encourage diet and exercise Screening A1c, lipid panel, CMP today  Allergic conjunctivitis and rhinitis Advised resuming Flonase and Zyrtec  Frozen shoulder New referral placed for physical therapy    Virginia Crews, MD, MPH PGY-2,  [facility] 06/25/2015 3:34 PM

## 2015-06-25 NOTE — Assessment & Plan Note (Signed)
New referral placed for physical therapy

## 2015-06-25 NOTE — Assessment & Plan Note (Signed)
Advised resuming Flonase and Zyrtec

## 2015-06-25 NOTE — Assessment & Plan Note (Signed)
Pap collected today Also screening for STDs with HIV, RPR, wet prep, GC chlamydia Screening A1c, lipid panel, CMP collected today Next wellness visit in one year

## 2015-06-25 NOTE — Assessment & Plan Note (Signed)
Encourage diet and exercise Screening A1c, lipid panel, CMP today

## 2015-06-25 NOTE — Patient Instructions (Signed)
Things to do to keep yourself healthy  - Exercise at least 30-45 minutes a day, 3-4 days a week.  - Eat a low-fat diet with lots of fruits and vegetables, up to 7-9 servings per day.  - Seatbelts can save your life. Wear them always.  - Smoke detectors on every level of your home, check batteries every year.  - Eye Doctor - have an eye exam every 1-2 years  - Safe sex - if you may be exposed to STDs, use a condom.  - Alcohol -  If you drink, do it moderately, less than 2 drinks per day.  - [place]. Choose someone to speak for you if you are not able.  - Depression is common in our stressful world.If you're feeling down or losing interest in things you normally enjoy, please come in for a visit.  - Violence - If anyone is threatening or hurting you, please call immediately.    Take care, Dr. Jacinto Reap

## 2015-06-26 LAB — HIV ANTIBODY (ROUTINE TESTING W REFLEX): HIV 1&2 Ab, 4th Generation: NONREACTIVE

## 2015-06-27 ENCOUNTER — Telehealth: Payer: Self-pay | Admitting: Family Medicine

## 2015-06-27 LAB — RPR

## 2015-06-27 LAB — CYTOLOGY - PAP

## 2015-06-27 NOTE — Telephone Encounter (Signed)
Called patient to discuss laboratory results.  No answer and no voicemail.  Would like to discuss possibility of starting a statin for high cholesteroil.  ASCVD 10 yr risk of 10.1%, so patient would benefit from high intensity statin.  Other lab results pending.  Will await other results and then attempt to call again or send letter.  Virginia Crews, MD, MPH PGY-2,  [facility] 06/27/2015 2:12 PM

## 2015-07-01 ENCOUNTER — Ambulatory Visit: Payer: 59 | Admitting: Family Medicine

## 2015-07-03 ENCOUNTER — Telehealth: Payer: Self-pay | Admitting: Family Medicine

## 2015-07-03 ENCOUNTER — Encounter: Payer: Self-pay | Admitting: Family Medicine

## 2015-07-03 ENCOUNTER — Ambulatory Visit (INDEPENDENT_AMBULATORY_CARE_PROVIDER_SITE_OTHER): Payer: 59 | Admitting: Family Medicine

## 2015-07-03 VITALS — Ht 60.0 in | Wt 147.1 lb

## 2015-07-03 DIAGNOSIS — E663 Overweight: Secondary | ICD-10-CM | POA: Diagnosis not present

## 2015-07-03 DIAGNOSIS — K5180 Other ulcerative colitis without complications: Secondary | ICD-10-CM

## 2015-07-03 NOTE — Patient Instructions (Signed)
Foods you have been avoiding b/c of UC: Popcorn, peels on many fruits, nuts, seeds, corn, broccoli, red meat, spicy foods, fried foods.  - Make a list of foods you would be interested in preparing; foods you like and can eat.  Email your list to [email or web address] well before your appt.   (If you don't hear back from me within a day, call!) - What helps in appetite control:  - Eating with regularity is crucial.   - Meals are more satisfying when they contain at least 3 separate components (vs. one-dish meals).    - Protein helps stabilize blood sugars, and control appetite.    - We need to choose foods that give [place] pleasure; however, we also need to recognize that TASTE PREFERENCES ARE LEARNED.  This means that it will get easier to choose foods you know are good for you if you are exposed to them enough.    Goals: 1. Eat at least REAL 3 meals and 1-2 snacks per day at least 5 days a week.  Aim for no more than 5 hours between eating.  Eat breakfast within one hour of getting up.   - A REAL meal includes a protein source, starch, veg's and/or fruit.    - OR:  Would you serve this to a guest in your home, and call it a meal.  (YOU DESERVE NO LESS!)  - Breakfast protein options: Eggs, yogurt, milk, choc milk (consider choc pro powder in milk), smoothie, peanut butter or cheese toast. 2. Incorporate one veg and one fruit or two veg servings on at least 5 days a week.  (Back off if not tolerated well.)   3. Physical activity:  Walk at least 20 min X 3 wk.   4. Set aside some planning time for meals once a week (for shopping as well as food prep).    Bring your goals sheet to follow-up.

## 2015-07-03 NOTE — Telephone Encounter (Signed)
HIV, RPR, GC/CT, and pap smear all negative.  Please attempt to call patient and inform of these labs and other note above.  Was unable to reach patient myself.  Virginia Crews, MD, MPH PGY-2,  [facility] 07/03/2015 9:40 AM

## 2015-07-03 NOTE — Progress Notes (Signed)
Medical Nutrition Therapy:  Appt start time: 3220 end time:  1630. Gastroenterologist: Dr. [facility]  Assessment:  Primary concerns today: diet for ulcerative colitis.   Erika Osborn has had ulcerative colitis since age 65, which has been well controlled for the past couple of months.  She sometimes feels her hunger is excessive ("nothing fills me up.")  Erika Osborn estimates she gets veg's about 5 X wk.  She is wary of branching out with her diet, since she does not want to prompt a flare of her UC.  She knows her current food choices do not support good weight management, however, and she feels part of her depression is related to her weight.  Erika Osborn does not especially like to cook, partly b/c she spent a lot of yrs cooking separate meals for different family member needs.  She lives with her husband and 71-YO daughter who has cerebral palsy, and is wheelchair-bound (and a younger daughter, who will be moving out this spring when she marries).    Learning Readiness:  Ready  Usual eating pattern includes 2-3 meals and 4-6 snacks per day. Frequent foods and beverages include blk coffee, herb tea with 1 t sugar, water, hot choc; [employer] and/or Cheerios for bkfst; fruit; Cheerios or tomato or potato soup with oyster crkrs for lunch; meat, starch, veg's for dinner; baked potatoes with butter and/or sour cream; usual snacks include Grk yog w/ fruit, crackers, cheese, sweets, bread, Nutella or peanut butter.  Avoided foods include salty foods (b/c of BP); foods she avoids b/c of colitis: popcorn, peels on many fruits, corn, broccoli, red meat, spicy foods, fried foods.  Salads are tolerated if small quantity ~once a week.    Usual physical activity includes none currently.  She plans to start exercising, as recommended by Dr. Brita Romp, which she knows will be helpful to her fibromyalgia.  UC has prevented exercise at times when it has been flaring.    24-hr recall: (Up at 8:30 AM) B (9 AM)-    2 c blk coffee, 1 almond biscuit Snk ( AM)-   --- L (11:30 PM)-  3 biscotti, 1 c herb tea, 1 t sugar Snk (2:30)-  1 1/2 c Cheerios, 1/2 c 2% milk D (6:30 PM)-  1 1/2 c Cheerios, 1/2 c 2% milk, water Snk ( PM)-  1/2 c vanilla Grk yog Typical day? Yes.    Progress Towards Goal(s):  In progress.   Nutritional Diagnosis:  Hand-2.1 Inpaired nutrition utilization As related to ulcerative colitis.  As evidenced by long-standing history of UC. [place]-3.3 Overweight/obesity As related to energy imbalance.  As evidenced by BMI >25.    Intervention:  Nutrition education  Handouts given during visit include:   AVS  Goals Sheet  Demonstrated degree of understanding via:  Teach Back  Barriers to learning/adherence to lifestyle change: Feeling depressed, partly b/c of her weight, partly not having things to do that are satisfying.    Monitoring/Evaluation:  Dietary intake, exercise, and body weight in 4 week(s).

## 2015-07-03 NOTE — Telephone Encounter (Signed)
Patient asks PCP for therapy referral (phone number was recently changed). Please, follow up with Patient.

## 2015-07-04 NOTE — Telephone Encounter (Signed)
Spoke with patient, gave her the number to contact PT at [phone]

## 2015-07-04 NOTE — Telephone Encounter (Signed)
Patient informed, expressed understanding. 

## 2015-07-09 ENCOUNTER — Ambulatory Visit: Payer: 59 | Attending: Family Medicine | Admitting: Physical Therapy

## 2015-07-09 DIAGNOSIS — M25612 Stiffness of left shoulder, not elsewhere classified: Secondary | ICD-10-CM

## 2015-07-09 DIAGNOSIS — R6889 Other general symptoms and signs: Secondary | ICD-10-CM | POA: Diagnosis present

## 2015-07-09 DIAGNOSIS — M25512 Pain in left shoulder: Secondary | ICD-10-CM | POA: Insufficient documentation

## 2015-07-09 DIAGNOSIS — R293 Abnormal posture: Secondary | ICD-10-CM | POA: Insufficient documentation

## 2015-07-09 DIAGNOSIS — M25611 Stiffness of right shoulder, not elsewhere classified: Secondary | ICD-10-CM

## 2015-07-09 DIAGNOSIS — M25511 Pain in right shoulder: Secondary | ICD-10-CM

## 2015-07-09 DIAGNOSIS — M7582 Other shoulder lesions, left shoulder: Secondary | ICD-10-CM | POA: Diagnosis present

## 2015-07-09 NOTE — Patient Instructions (Signed)
5 x a day  Hands together thumbs crossed isometric with arm flex 10 x  As shown in clinic  SHOULDER: External Rotation - Supine (Cane)   Hold cane with both hands. Rotate arm away from body. Keep elbow on floor and next to body. _10__ reps per set, _1-2__ sets per day, 7___ days per week Add towel to keep elbow at side.  Copyright  VHI. All rights reserved.  Cane Horizontal - Supine   With straight arms holding cane above shoulders, bring cane out to right, center, out to left, and back to above head. Repeat _10__ times. Do _2__ times per day.  Copyright  VHI. All rights reserved.  Cane Exercise: Flexion   Lie on back, holding cane above chest. Keeping arms as straight as possible, lower cane toward floor beyond head. Hold __3-5__ seconds. Repeat _10___ times. Do _1-2___ sessions per day.  http://[email or web address].exer.us/91   Copyright  VHI. All rights reserved.  Voncille Lo, PT 07/09/2015 10:00 AM Phone: [phone] Fax: [phone]

## 2015-07-09 NOTE — Therapy (Signed)
[place], [place], [address] Phone: [phone]   Fax:  [phone]  Physical Therapy Evaluation  Patient Details  Name: Erika Osborn MRN: [id number] Date of Birth: November 28, 1950 Referring Provider: Virginia Crews  MD  Encounter Date: 07/09/2015      PT End of Session - 07/09/15 0936    Visit Number 1   Number of Visits 16   Date for PT Re-Evaluation 09/03/15   Authorization Type UHC   PT Start Time 0933   Activity Tolerance Patient tolerated treatment well   Behavior During Therapy [facility] for tasks assessed/performed      Past Medical History  Diagnosis Date  . Arthritis   . GERD (gastroesophageal reflux disease)   . HTN (hypertension)   . HLD (hyperlipidemia)   . Fibromyalgia   . Hypothyroidism following radioiodine therapy   . Ulcerative colitis ([place]) 1976    Past Surgical History  Procedure Laterality Date  . Lumbar disc surgery    . Breast reduction surgery      There were no vitals filed for this visit.  Visit Diagnosis:  Abnormal posture  Decreased ROM of left shoulder  Decreased ROM of right shoulder  Pain in right shoulder  Pain in left shoulder  Activity intolerance      Subjective Assessment - 07/09/15 0941    Subjective Left shoulder began with pain 9 years ago.  Recently getting worse  and recently get cortisone in left since October 2016,  Right also hurts and began about 6 months and I can no longer sleep on any side   Pertinent History fibromyalgia, ulcerative colitis. back surgery 15 years ago  fusion? lived in [place]   Limitations Lifting;House hold activities   How long can you sit comfortably? unlimited   How long can you stand comfortably? unlimited   How long can you walk comfortably? unlimited   Diagnostic tests none   Patient Stated Goals be relievd of pain. care for my daughter without pain,  dress without pain and lift above my head and get  things out of cabinet. and showering back   Currently in Pain? Yes   Pain Score 8    Pain Location Shoulder   Pain Orientation Left   Pain Descriptors / Indicators Aching   Pain Type Chronic pain   Pain Onset More than a month ago   Pain Frequency Constant   Aggravating Factors  sleeping , reaching for objects above head  , has 66 yo handicapped daughter and needs to be able to roll her, husband transfers to chair   Pain Relieving Factors medication   Multiple Pain Sites Yes   Pain Score 5   Pain Location Shoulder   Pain Orientation Right   Pain Descriptors / Indicators Aching   Pain Type Chronic pain   Pain Onset More than a month ago   Pain Frequency Constant            [facility] PT Assessment - 07/09/15 0947    Assessment   Medical Diagnosis bilateral shoulder    Referring Provider Virginia Crews  MD   Onset Date/Surgical Date 04/08/15  9 years for left shoulder, 6 months for right   Hand Dominance Right   Prior Therapy 9 years ago   Precautions   Precautions None   Restrictions   Weight Bearing Restrictions No   Balance Screen   Has the patient fallen in the past 6 months No   Has  the patient had a decrease in activity level because of a fear of falling?  No   Is the patient reluctant to leave their home because of a fear of falling?  No   Home Ecologist residence   Home Access Stairs to enter   Entrance Stairs-Number of Steps 1   [place] One level   Cognition   Overall Cognitive Status Within Functional Limits for tasks assessed   Observation/Other Assessments   Focus on Therapeutic Outcomes (FOTO)  intake 41%, limitation 59%, predicted 40%   AROM   Right Shoulder Extension 40 Degrees   Right Shoulder Flexion 126 Degrees   Right Shoulder ABduction 124 Degrees   Right Shoulder Internal Rotation 60 Degrees   Right Shoulder External Rotation 49 Degrees   Left Shoulder Extension 35 Degrees   Left Shoulder Flexion 95  Degrees   Left Shoulder ABduction 86 Degrees   Left Shoulder Internal Rotation 20 Degrees   Left Shoulder External Rotation 10 Degrees   Strength   Overall Strength Deficits   Right Shoulder Flexion 3-/5  limited by pain   Right Shoulder ABduction 3-/5   Left Shoulder Flexion 3-/5   Left Shoulder ABduction 3-/5   Palpation   Palpation comment Pt with tenderness over bilaterall infraspinatus, supraspinatues teres minor ( left marked pain and marked pain over subscapualris bilaterally  .          Therapeutic Exercise    Supine cane exericises flex, abd and ER x 10 with VC TC  With hands together thumb crosssed isometric for deltoed engagement to be done 5 x a day 10 x                  PT Education - 07/09/15 1015    Education provided Yes   Education Details Pt given education on posture/sitting and standing and supine cane exericises   Person(s) Educated Patient   Methods Explanation;Demonstration;Tactile cues;Verbal cues;Handout   Comprehension Verbalized understanding;Returned demonstration          PT Short Term Goals - 07/09/15 1015    PT SHORT TERM GOAL #1   Title "Independent with initial HEP 08-09-15   Time 4   Period Weeks   Status New   PT SHORT TERM GOAL #2   Title "Report pain decrease from   8 /10 to   4 /10.   Time 4   PT SHORT TERM GOAL #3   Title "Demonstrate understanding of proper sitting posture, body mechanics, work ergonomics, and be more conscious of position and posture throughout the day.    Time 4   Period Weeks   Status New           PT Long Term Goals - 07/09/15 1446    PT LONG TERM GOAL #1   Title "Demonstrate and verbalize techniques to reduce the risk of re-injury including: lifting, posture, body mechanics.    Time 8   Period Weeks   Status New   PT LONG TERM GOAL #2   Title "Pt will be independent with advanced HEP   Time 8   Period Weeks   Status New   PT LONG TERM GOAL #3   Title "Pain will decrease to 1/10  with all functional activities   Time 8   Period Weeks   Status New   PT LONG TERM GOAL #4   Title " bilateral  shoulder AROM scaption will improve to 0-160 degrees for improved overhead reaching.  Time 8   Period Weeks   Status New   PT LONG TERM GOAL #5   Title  bilateral shoulder IR and ER will return to [facility] to return to pain-free ADLs such as dressing and grooming   Time 8   Period Weeks   Status New   Additional Long Term Goals   Additional Long Term Goals Yes   PT LONG TERM GOAL #6   Title "FOTO will improve from  59%  to   40% limtation  indicating improved functional mobility    Time 8   Period Weeks   Status New               Plan - 07/09/15 1014    Clinical Impression Statement 65 yo with fibromyalgia and ulcerative colitis presents with bil  shoulder pain with Left greater than right with hx of shoulder problems for past 9 years but recently exacerbating in pain to 8/10 or higher in last 6 months.  Pt has recently recieved a cortisone injection in left shoudler with minimal improvement,  Pt with decreased AROM in lef tthan right although both are limted as per assessment. 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 Pt presents with impairments including pain, limited ROM, weakness, abnormal posture  and as a result, pt is limited with ADLs, lifting activities and functional activities. Pt would benefit from skilled outpatient PT services for 2 times a week for 8 weeks to progress toward reduced pain with funtional activieites   Pt will benefit from skilled therapeutic intervention in order to improve on the following deficits Pain;Postural dysfunction;Improper body mechanics;Impaired UE functional use;Increased fascial restricitons;Increased muscle spasms;Decreased strength;Decreased activity tolerance   PT Frequency 2x / week   PT Duration 8 weeks   PT Treatment/Interventions ADLs/Self Care Home  Management;Cryotherapy;Electrical Stimulation;Iontophoresis 62m/ml Dexamethasone;Moist Heat;Therapeutic exercise;Functional mobility training;Ultrasound;Patient/family education;Taping;Dry needling;Passive range of motion;Manual techniques   PT Next Visit Plan modalites for pain, mobs for shoulders and progress exericses as able   PT Home Exercise Plan supine cane exericses   Consulted and Agree with Plan of Care Patient         Problem List Patient Active Problem List   Diagnosis Date Noted  . Overweight (BMI 25.0-29.9) 06/25/2015  . Healthcare maintenance 03/08/2015  . 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 Allergic conjunctivitis and rhinitis 01/17/2014  . 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 Fibromyalgia 07/19/2013  . HTN (hypertension) 07/19/2013  . HLD (hyperlipidemia) 07/19/2013  . Ulcerative colitis ([place] 07/19/2013   LVoncille Lo PT 07/09/2015 3:06 PM Phone: [phone]: 3[facility] [address] [address]Phone: [phone]  Fax:  [phone] Name: Erika HeindlMRN: [id number] of Birth: [birth year]

## 2015-07-16 ENCOUNTER — Ambulatory Visit: Payer: 59 | Admitting: Physical Therapy

## 2015-07-16 DIAGNOSIS — R293 Abnormal posture: Secondary | ICD-10-CM

## 2015-07-16 DIAGNOSIS — M25512 Pain in left shoulder: Secondary | ICD-10-CM

## 2015-07-16 DIAGNOSIS — M25611 Stiffness of right shoulder, not elsewhere classified: Secondary | ICD-10-CM

## 2015-07-16 DIAGNOSIS — M25511 Pain in right shoulder: Secondary | ICD-10-CM

## 2015-07-16 DIAGNOSIS — R6889 Other general symptoms and signs: Secondary | ICD-10-CM

## 2015-07-16 DIAGNOSIS — M25612 Stiffness of left shoulder, not elsewhere classified: Secondary | ICD-10-CM

## 2015-07-16 NOTE — Patient Instructions (Signed)
Flexion (Eccentric) - Active-Assist (Cane)   Use unaffected arm to push affected arm forward. Avoid hiking shoulder. Keep palm relaxed. Slowly lower affected arm for 3-5 seconds, increasing use of affected arm. _10__ reps per set, _2-3__ sets per day, _7__ days per week.   Copyright  VHI. All rights reserved  Cane Exercise: Abduction   Hold cane with right hand over end, palm-up, with other hand palm-down. Move arm out from side and up by pushing with other arm. Hold ___5_ seconds. Repeat ___10_ times. Do ___2-3_ sessions per day.  http://[email or web address].exer.us/81   Copyright  VHI. All rights reserved.      Cane Exercise: Extension / Internal Rotation   Stand holding cane behind back with both hands palm-up. Slide cane up spine toward head. Hold _5___ seconds. Repeat __10__ times. Do __2-3__ sessions per day.  http://[email or web address].exer.[email or web address]   Copyright  VHI. All rights reserved.  Kasandra Knudsen Exercise: Extension   Stand holding cane behind back with both hands palm-up. Lift the cane away from body. Hold __5__ seconds. Repeat __10__ times. Do _2-3___ sessions per day.  http://[email or web address].exer.us/83   Copyright  VHI. All rights reserved.

## 2015-07-16 NOTE — Therapy (Signed)
[place], [place], [address] Phone: [phone]   Fax:  [phone]  Physical Therapy Treatment  Patient Details  Name: Erika Osborn MRN: [id number] Date of Birth: Aug 22, 1950 Referring Provider: Virginia Crews  MD  Encounter Date: 07/16/2015      PT End of Session - 07/16/15 1023    Visit Number 2   Number of Visits 16   Date for PT Re-Evaluation 09/03/15   Authorization Type UHC   PT Start Time 0930   PT Stop Time 1015   PT Time Calculation (min) 45 min      Past Medical History  Diagnosis Date  . Arthritis   . GERD (gastroesophageal reflux disease)   . HTN (hypertension)   . HLD (hyperlipidemia)   . Fibromyalgia   . Hypothyroidism following radioiodine therapy   . Ulcerative colitis ([place]) 1976    Past Surgical History  Procedure Laterality Date  . Lumbar disc surgery    . Breast reduction surgery      There were no vitals filed for this visit.  Visit Diagnosis:  Abnormal posture  Decreased ROM of left shoulder  Decreased ROM of right shoulder  Pain in right shoulder  Pain in left shoulder  Activity intolerance      Subjective Assessment - 07/16/15 0935    Subjective Both shoulders are hurting today.    Currently in Pain? Yes   Pain Score 6    Pain Location Shoulder   Pain Orientation Right;Left   Pain Descriptors / Indicators Aching;Sharp   Aggravating Factors  sleeping, reaching    Pain Relieving Factors medication            [facility] PT Assessment - 07/16/15 0001    AROM   Right Shoulder Flexion 135 Degrees   Right Shoulder ABduction 130 Degrees   Left Shoulder Flexion 120 Degrees   Left Shoulder ABduction 95 Degrees   Left Shoulder Internal Rotation --  reach t 12   Left Shoulder External Rotation --  reach t 2                      [facility] Adult PT Treatment/Exercise - 07/16/15 0001    Shoulder Exercises: Supine   Other Supine Exercises  Supine cane exercises 10x 2 each -cues required to complete correctly.   Shoulder Exercises: ROM/Strengthening   Other ROM/Strengthening Exercises Standing cane exercises x 10 each left only today for flexion and abduction due to time- issued for HEP   Manual Therapy   Manual Therapy Joint mobilization;Passive ROM   Joint Mobilization grade 2,3 A/P mobs GH joints bilateral as well as inferior glides followed by PROM Flex, abdct, IR /ER with contract relax for ER on left- monitoring pain throughout                PT Education - 07/16/15 1014    Education provided Yes   Education Details standing cane   Person(s) Educated Patient   Methods Explanation;Handout   Comprehension Verbalized understanding          PT Short Term Goals - 07/16/15 1039    PT SHORT TERM GOAL #1   Title "Independent with initial HEP 08-09-15   Time 4   Period Weeks   Status On-going   PT SHORT TERM GOAL #2   Title "Report pain decrease from   8 /10 to   4 /10.   Time 4   Period Weeks  Status On-going   PT SHORT TERM GOAL #3   Title "Demonstrate understanding of proper sitting posture, body mechanics, work ergonomics, and be more conscious of position and posture throughout the day.    Time 4   Period Weeks   Status On-going           PT Long Term Goals - 07/09/15 1446    PT LONG TERM GOAL #1   Title "Demonstrate and verbalize techniques to reduce the risk of re-injury including: lifting, posture, body mechanics.    Time 8   Period Weeks   Status New   PT LONG TERM GOAL #2   Title "Pt will be independent with advanced HEP   Time 8   Period Weeks   Status New   PT LONG TERM GOAL #3   Title "Pain will decrease to 1/10 with all functional activities   Time 8   Period Weeks   Status New   PT LONG TERM GOAL #4   Title " bilateral  shoulder AROM scaption will improve to 0-160 degrees for improved overhead reaching.    Time 8   Period Weeks   Status New   PT LONG TERM GOAL #5    Title  bilateral shoulder IR and ER will return to [facility] to return to pain-free ADLs such as dressing and grooming   Time 8   Period Weeks   Status New   Additional Long Term Goals   Additional Long Term Goals Yes   PT LONG TERM GOAL #6   Title "FOTO will improve from  59%  to   40% limtation  indicating improved functional mobility    Time 8   Period Weeks   Status New               Plan - 07/16/15 1014    Clinical Impression Statement mobs and PROM bilateral shoulders. Review of current HEP. Instructed pt in standing cane for HEP progression. AROM improved since eval- see objective measures. Pt reports less pain after treatment.    PT Next Visit Plan modalities, mobs, review staning cane, progress to [place] for right shoulder and isometrics or [place] for left        Problem List Patient Active Problem List   Diagnosis Date Noted  . Overweight (BMI 25.0-29.9) 06/25/2015  . Healthcare maintenance 03/08/2015  . 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 Allergic conjunctivitis and rhinitis 01/17/2014  . 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 Fibromyalgia 07/19/2013  . HTN (hypertension) 07/19/2013  . HLD (hyperlipidemia) 07/19/2013  . Ulcerative colitis ([place]) 07/19/2013    Erika Osborn, PTA 07/16/2015, 10:50 AM  [place], [place], [address] Phone: [phone]   Fax:  [phone]  Name: Erika Osborn MRN: [id number] Date of Birth: 01/05/1951

## 2015-07-18 ENCOUNTER — Ambulatory Visit: Payer: 59 | Admitting: Physical Therapy

## 2015-07-18 DIAGNOSIS — R293 Abnormal posture: Secondary | ICD-10-CM | POA: Diagnosis not present

## 2015-07-18 DIAGNOSIS — M25612 Stiffness of left shoulder, not elsewhere classified: Secondary | ICD-10-CM

## 2015-07-18 DIAGNOSIS — M25511 Pain in right shoulder: Secondary | ICD-10-CM

## 2015-07-18 DIAGNOSIS — R6889 Other general symptoms and signs: Secondary | ICD-10-CM

## 2015-07-18 DIAGNOSIS — M25611 Stiffness of right shoulder, not elsewhere classified: Secondary | ICD-10-CM

## 2015-07-18 DIAGNOSIS — M25512 Pain in left shoulder: Secondary | ICD-10-CM

## 2015-07-18 NOTE — Patient Instructions (Signed)
EXTENSION: Standing - Resistance Band: Stable (Active)   Stand, right arm at side. Against yellow resistance band, draw arm backward, as far as possible, keeping elbow straight. Complete __3_ sets of _10__ repetitions. Perform _1-2_ sessions per day. Use yellow theraband  Copyright  VHI. All rights reserved.  Row: Mid-Range - Standing   Copyright  VHI. All rights reserved.  Resistive Band Rowing   With resistive band anchored in door, grasp both ends. Keeping elbows bent, pull back, squeezing shoulder blades together. Hold _3-5___ seconds. Repeat _10 x 3___ times. Do __1-2__ sessions per day.  http://[email or web address].exer.us/97    Please do the External rotation with yellow theraband as shown in clinic  Take yellow theraband and place on hand with thumbs up. Bring elbows to side and remain there during exericise.  Bring arms out to 45 degrees as shown in clinic.  Do 3 x 10 with yellow theraband.  Copyright  VHI. All rights reserved.  Voncille Lo, PT 07/18/2015 9:47 AM Phone: [phone] Fax: [phone]

## 2015-07-18 NOTE — Therapy (Signed)
[place], [place], [address] Phone: [phone]   Fax:  [phone]  Physical Therapy Treatment  Patient Details  Name: Erika Osborn MRN: [id number] Date of Birth: 1950/10/22 Referring Provider: Virginia Crews  MD  Encounter Date: 07/18/2015      PT End of Session - 07/18/15 0952    Visit Number 3   Number of Visits 16   Date for PT Re-Evaluation 09/03/15   Authorization Type UHC   PT Start Time 0930   PT Stop Time 1015   PT Time Calculation (min) 45 min   Activity Tolerance Patient tolerated treatment well   Behavior During Therapy [facility] for tasks assessed/performed      Past Medical History  Diagnosis Date  . Arthritis   . GERD (gastroesophageal reflux disease)   . HTN (hypertension)   . HLD (hyperlipidemia)   . Fibromyalgia   . Hypothyroidism following radioiodine therapy   . Ulcerative colitis ([place]) 1976    Past Surgical History  Procedure Laterality Date  . Lumbar disc surgery    . Breast reduction surgery      There were no vitals filed for this visit.  Visit Diagnosis:  Abnormal posture  Decreased ROM of left shoulder  Decreased ROM of right shoulder  Pain in right shoulder  Pain in left shoulder  Activity intolerance      Subjective Assessment - 07/18/15 0932    Subjective both shoulders hurt, Left 4/10, Right 2/10   Pertinent History fibromyalgia, ulcerative colitis. back surgery 15 years ago  fusion? lived in [employer]   Limitations Lifting;House hold activities   Patient Stated Goals be relievd of pain. care for my daughter without pain,  dress without pain and lift above my head and get things out of cabinet. and showering back   Currently in Pain? Yes   Pain Score 2    Pain Location Shoulder   Pain Orientation Left   Pain Descriptors / Indicators Aching;Sharp;Dull  left dull   Pain Type Chronic pain   Pain Onset More than a month ago   Pain Frequency  Constant   Pain Score 4   Pain Location Shoulder   Pain Orientation Right   Pain Descriptors / Indicators Aching   Pain Type Chronic pain   Pain Onset More than a month ago   Pain Frequency Constant            [facility] PT Assessment - 07/18/15 1003    AROM   Right Shoulder Flexion 143 Degrees  before mobs   Right Shoulder ABduction 134 Degrees  before mobs   Left Shoulder Flexion 132 Degrees  after mobs   Left Shoulder ABduction 102 Degrees  after mobs   Left Shoulder Internal Rotation --  reach t 12   Left Shoulder External Rotation --  reach t 4                     [facility] Adult PT Treatment/Exercise - 07/18/15 0942    Shoulder Exercises: Standing   External Rotation Strengthening;Both;10 reps  x2 VC for proper execution   Theraband Level (Shoulder External Rotation) Level 1 (Yellow)   Extension Strengthening;Both;10 reps  x 2   Theraband Level (Shoulder Extension) Level 1 (Yellow)   Row Strengthening;Theraband;10 reps  x2   Theraband Level (Shoulder Row) Level 1 (Yellow)   Shoulder Exercises: ROM/Strengthening   Other ROM/Strengthening Exercises Standing cane exercises x 10 each abd, flex and ER  with cane.    Manual Therapy   Manual Therapy Joint mobilization;Passive ROM   Joint Mobilization grade 2,3 A/P mobs GH joints bilateral as well as inferior glides , also with abd 45 and distraction IR/ER grade 2/3.                 PT Education - 07/18/15 0950    Education provided Yes   Education Details Reviewed HEP for standing cane and added bilateral rows, extension and ER with yellow t band   Person(s) Educated Patient   Methods Explanation;Demonstration   Comprehension Verbalized understanding;Returned demonstration          PT Short Term Goals - 07/18/15 1015    PT SHORT TERM GOAL #1   Title "Independent with initial HEP 08-09-15   Time 4   Period Weeks   Status On-going   PT SHORT TERM GOAL #2   Title "Report pain decrease from   8  /10 to   4 /10.   Baseline Pt pain in right 2/10 and left 4/10 today   Time 4   Period Weeks   Status Achieved   PT SHORT TERM GOAL #3   Title "Demonstrate understanding of proper sitting posture, body mechanics, work ergonomics, and be more conscious of position and posture throughout the day.    Time 4   Period Weeks   Status On-going           PT Long Term Goals - 07/09/15 1446    PT LONG TERM GOAL #1   Title "Demonstrate and verbalize techniques to reduce the risk of re-injury including: lifting, posture, body mechanics.    Time 8   Period Weeks   Status New   PT LONG TERM GOAL #2   Title "Pt will be independent with advanced HEP   Time 8   Period Weeks   Status New   PT LONG TERM GOAL #3   Title "Pain will decrease to 1/10 with all functional activities   Time 8   Period Weeks   Status New   PT LONG TERM GOAL #4   Title " bilateral  shoulder AROM scaption will improve to 0-160 degrees for improved overhead reaching.    Time 8   Period Weeks   Status New   PT LONG TERM GOAL #5   Title  bilateral shoulder IR and ER will return to [facility] to return to pain-free ADLs such as dressing and grooming   Time 8   Period Weeks   Status New   Additional Long Term Goals   Additional Long Term Goals Yes   PT LONG TERM GOAL #6   Title "FOTO will improve from  59%  to   40% limtation  indicating improved functional mobility    Time 8   Period Weeks   Status New               Plan - 07/18/15 0953    Clinical Impression Statement Pt returns to [place] with 2/10 pain in Right shoulder and left is 4/10,  pt is trying to walk and tried to walk for 10 minutes.   Pt with increased AROM of bil shoulders( see assessment)  and making progress toward goals. Pt achieved STG # 2 for pain goal today.   PT Next Visit Plan modalities, mobs, review bil ext/rows and er with yellow t band  progress to [place] for right shoulder and isometrics or [place] for left as needed, soft tissue on  infraspinatus, teres minor and sub scapularis   PT Home Exercise Plan bil scapular strength.   Consulted and Agree with Plan of Care Patient        Problem List Patient Active Problem List   Diagnosis Date Noted  . Overweight (BMI 25.0-29.9) 06/25/2015  . Healthcare maintenance 03/08/2015  . 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 Allergic conjunctivitis and rhinitis 01/17/2014  . 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 Fibromyalgia 07/19/2013  . HTN (hypertension) 07/19/2013  . HLD (hyperlipidemia) 07/19/2013  . Ulcerative colitis ([place]) 07/19/2013   Voncille Lo, PT 07/18/2015 2:39 PM Phone: [phone] Fax: [place] [facility] [address] [place], [place], [address] Phone: [phone]   Fax:  [phone]  Name: Erika Osborn MRN: [id number] Date of Birth: 1950-10-30

## 2015-07-23 ENCOUNTER — Ambulatory Visit: Payer: 59 | Admitting: Physical Therapy

## 2015-07-23 DIAGNOSIS — M25511 Pain in right shoulder: Secondary | ICD-10-CM

## 2015-07-23 DIAGNOSIS — M25611 Stiffness of right shoulder, not elsewhere classified: Secondary | ICD-10-CM

## 2015-07-23 DIAGNOSIS — M25612 Stiffness of left shoulder, not elsewhere classified: Secondary | ICD-10-CM

## 2015-07-23 DIAGNOSIS — R293 Abnormal posture: Secondary | ICD-10-CM | POA: Diagnosis not present

## 2015-07-23 DIAGNOSIS — R6889 Other general symptoms and signs: Secondary | ICD-10-CM

## 2015-07-23 DIAGNOSIS — M25512 Pain in left shoulder: Secondary | ICD-10-CM

## 2015-07-23 NOTE — Therapy (Signed)
[place], [place], [address] Phone: [phone]   Fax:  [phone]  Physical Therapy Treatment  Patient Details  Name: Erika Osborn MRN: [id number] Date of Birth: August 08, 1950 Referring Provider: Virginia Crews  MD  Encounter Date: 07/23/2015      PT End of Session - 07/23/15 1240    Visit Number 4   Number of Visits 16   Date for PT Re-Evaluation 09/03/15   Authorization Type UHC   PT Start Time 0930   PT Stop Time 1013   PT Time Calculation (min) 43 min   Activity Tolerance Patient tolerated treatment well   Behavior During Therapy [facility] for tasks assessed/performed      Past Medical History  Diagnosis Date  . Arthritis   . GERD (gastroesophageal reflux disease)   . HTN (hypertension)   . HLD (hyperlipidemia)   . Fibromyalgia   . Hypothyroidism following radioiodine therapy   . Ulcerative colitis ([place]) 1976    Past Surgical History  Procedure Laterality Date  . Lumbar disc surgery    . Breast reduction surgery      There were no vitals filed for this visit.  Visit Diagnosis:  Abnormal posture  Decreased ROM of left shoulder  Decreased ROM of right shoulder  Pain in right shoulder  Pain in left shoulder  Activity intolerance      Subjective Assessment - 07/23/15 0933    Subjective I was not able to do exericises and I left my new exercisis here last time.  Today I have a headache   Pertinent History fibromyalgia, ulcerative colitis. back surgery 15 years ago  fusion? lived in [place]   Limitations Lifting;House hold activities   Patient Stated Goals be relievd of pain. care for my daughter without pain,  dress without pain and lift above my head and get things out of cabinet. and showering back   Currently in Pain? Yes   Pain Score 5    Pain Location Shoulder   Pain Orientation Left   Pain Descriptors / Indicators Aching;Shooting;Sharp   Pain Type Chronic pain   Pain Onset More than a month ago   Pain Frequency Constant   Aggravating Factors  sleeping, reaching   Pain Score 2   Pain Location Shoulder   Pain Orientation Right   Pain Descriptors / Indicators Aching   Pain Type Chronic pain   Pain Onset More than a month ago   Pain Frequency Constant            [facility] PT Assessment - 07/23/15 1014    AROM   Right Shoulder Flexion 149 Degrees  after  mobs and adhesion "pop"   Right Shoulder ABduction 138 Degrees   Left Shoulder Flexion 138 Degrees  sitting and then supine  146   Left Shoulder ABduction 118 Degrees  sitting                     [facility] Adult PT Treatment/Exercise - 07/23/15 0940    Shoulder Exercises: Sidelying   External Rotation Left;10 reps;Weights  x 2   External Rotation Weight (lbs) 2 lb    Shoulder Exercises: Standing   External Rotation Strengthening;Both;10 reps  x2 VC for proper execution   Theraband Level (Shoulder External Rotation) Level 1 (Yellow)   Extension Strengthening;Both;10 reps  x 2   Theraband Level (Shoulder Extension) Level 1 (Yellow)   Row Strengthening;Theraband;10 reps  x2   Theraband Level (Shoulder Row)  Level 1 (Yellow)   Other Standing Exercises review standing cane exercise flex, abd and ER bil  10 each   Shoulder Exercises: ROM/Strengthening   Other ROM/Strengthening Exercises --   Manual Therapy   Manual Therapy Joint mobilization;Passive ROM   Joint Mobilization grade 2,3 A/P mobs GH joints bilateral as well as inferior glides , also with abd 45 and distraction IR/ER grade 2/3.    Soft tissue mobilization teres minor, infraspinatus and supraspnatus.and lats bil with IASTYM tool in sidelying    Scapular Mobilization All planes bilaerally wiht adhesion " pop" and increased ROM   Passive ROM Myofascial stretch of above muscles with PT PROM and holding onto specific muscles for added stretch.                  PT Education - 07/23/15 1239    Education provided  Yes   Education Details Reviewed HEP for standing cane and reviewed added bil rows, ext and ER with yellow T band.  Pt needs added time to complete    Person(s) Educated Patient   Methods Explanation;Demonstration;Handout  reprinted handout   Comprehension Verbalized understanding;Returned demonstration          PT Short Term Goals - 07/18/15 1015    PT SHORT TERM GOAL #1   Title "Independent with initial HEP 08-09-15   Time 4   Period Weeks   Status On-going   PT SHORT TERM GOAL #2   Title "Report pain decrease from   8 /10 to   4 /10.   Baseline Pt pain in right 2/10 and left 4/10 today   Time 4   Period Weeks   Status Achieved   PT SHORT TERM GOAL #3   Title "Demonstrate understanding of proper sitting posture, body mechanics, work ergonomics, and be more conscious of position and posture throughout the day.    Time 4   Period Weeks   Status On-going           PT Long Term Goals - 07/09/15 1446    PT LONG TERM GOAL #1   Title "Demonstrate and verbalize techniques to reduce the risk of re-injury including: lifting, posture, body mechanics.    Time 8   Period Weeks   Status New   PT LONG TERM GOAL #2   Title "Pt will be independent with advanced HEP   Time 8   Period Weeks   Status New   PT LONG TERM GOAL #3   Title "Pain will decrease to 1/10 with all functional activities   Time 8   Period Weeks   Status New   PT LONG TERM GOAL #4   Title " bilateral  shoulder AROM scaption will improve to 0-160 degrees for improved overhead reaching.    Time 8   Period Weeks   Status New   PT LONG TERM GOAL #5   Title  bilateral shoulder IR and ER will return to [facility] to return to pain-free ADLs such as dressing and grooming   Time 8   Period Weeks   Status New   Additional Long Term Goals   Additional Long Term Goals Yes   PT LONG TERM GOAL #6   Title "FOTO will improve from  59%  to   40% limtation  indicating improved functional mobility    Time 8   Period Weeks    Status New               Plan - 07/23/15 1241  Clinical Impression Statement Pt returns to clinic with right shoulder 5/10 and left 2/10 .  Pt admits not being able to be consistent with exercise. Pt did recieve review of exericise and manual mobs and soft tiissue with I ASTYM tool. Pt had adhesion " pop on  LEft arm and had increased AROM.  See assessment..  Pt is making slow but steady gains in ROM and will need to increase strenghtening.. continue HEP    Pt will benefit from skilled therapeutic intervention in order to improve on the following deficits Pain;Postural dysfunction;Improper body mechanics;Impaired UE functional use;Increased fascial restricitons;Increased muscle spasms;Decreased strength;Decreased activity tolerance   Rehab Potential Good   PT Duration 8 weeks   PT Treatment/Interventions ADLs/Self Care Home Management;Cryotherapy;Electrical Stimulation;Iontophoresis 58m/ml Dexamethasone;Moist Heat;Therapeutic exercise;Functional mobility training;Ultrasound;Patient/family education;Taping;Dry needling;Passive range of motion;Manual techniques   PT Next Visit Plan  progress to [place] for right shoulder and isometrics or [place] for left as needed, soft tissue on infraspinatus, teres minor and sub scapularis, mobs to bil shoulder  wall clock   PT Home Exercise Plan bil scapular strength.   Consulted and Agree with Plan of Care Patient        Problem List Patient Active Problem List   Diagnosis Date Noted  . Overweight (BMI 25.0-29.9) 06/25/2015  . Healthcare maintenance 03/08/2015  . 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 Allergic conjunctivitis and rhinitis 01/17/2014  . 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 Fibromyalgia 07/19/2013  . HTN (hypertension) 07/19/2013  . HLD (hyperlipidemia) 07/19/2013  . [place]cerative colitis ([place] 07/19/2013    LVoncille Lo PT 07/23/2015 12:45 PM Phone: [phone]: [facility] [address]Phone: [phone]  Fax:  [phone] Name: JKeyauna GraefeMRN: [id number] of Birth: 627-May-1952

## 2015-07-25 ENCOUNTER — Encounter: Payer: 59 | Admitting: Physical Therapy

## 2015-07-29 ENCOUNTER — Ambulatory Visit: Payer: 59 | Attending: Family Medicine | Admitting: Physical Therapy

## 2015-07-29 DIAGNOSIS — R293 Abnormal posture: Secondary | ICD-10-CM | POA: Insufficient documentation

## 2015-07-29 DIAGNOSIS — R6889 Other general symptoms and signs: Secondary | ICD-10-CM | POA: Insufficient documentation

## 2015-07-29 DIAGNOSIS — M7582 Other shoulder lesions, left shoulder: Secondary | ICD-10-CM | POA: Insufficient documentation

## 2015-07-29 DIAGNOSIS — M25512 Pain in left shoulder: Secondary | ICD-10-CM | POA: Insufficient documentation

## 2015-07-29 DIAGNOSIS — M25511 Pain in right shoulder: Secondary | ICD-10-CM | POA: Insufficient documentation

## 2015-07-30 ENCOUNTER — Ambulatory Visit: Payer: 59 | Admitting: Family Medicine

## 2015-08-01 ENCOUNTER — Ambulatory Visit: Payer: 59 | Admitting: Physical Therapy

## 2015-08-01 DIAGNOSIS — R293 Abnormal posture: Secondary | ICD-10-CM | POA: Diagnosis not present

## 2015-08-01 DIAGNOSIS — R6889 Other general symptoms and signs: Secondary | ICD-10-CM | POA: Diagnosis present

## 2015-08-01 DIAGNOSIS — M25612 Stiffness of left shoulder, not elsewhere classified: Secondary | ICD-10-CM

## 2015-08-01 DIAGNOSIS — M7582 Other shoulder lesions, left shoulder: Secondary | ICD-10-CM | POA: Diagnosis present

## 2015-08-01 DIAGNOSIS — M25611 Stiffness of right shoulder, not elsewhere classified: Secondary | ICD-10-CM

## 2015-08-01 DIAGNOSIS — M25511 Pain in right shoulder: Secondary | ICD-10-CM

## 2015-08-01 DIAGNOSIS — M25512 Pain in left shoulder: Secondary | ICD-10-CM | POA: Diagnosis present

## 2015-08-01 NOTE — Patient Instructions (Addendum)
Trigger Point Dry Needling  . What is Trigger Point Dry Needling (DN)? o DN is a physical therapy technique used to treat muscle pain and dysfunction. Specifically, DN helps deactivate muscle trigger points (muscle knots).  o A thin filiform needle is used to penetrate the skin and stimulate the underlying trigger point. The goal is for a local twitch response (LTR) to occur and for the trigger point to relax. No medication of any kind is injected during the procedure.   . What Does Trigger Point Dry Needling Feel Like?  o The procedure feels different for each individual patient. Some patients report that they do not actually feel the needle enter the skin and overall the process is not painful. Very mild bleeding may occur. However, many patients feel a deep cramping in the muscle in which the needle was inserted. This is the local twitch response.   Marland Kitchen How Will I feel after the treatment? o Soreness is normal, and the onset of soreness may not occur for a few hours. Typically this soreness does not last longer than two days.  o Bruising is uncommon, however; ice can be used to decrease any possible bruising.  o In rare cases feeling tired or nauseous after the treatment is normal. In addition, your symptoms may get worse before they get better, this period will typically not last longer than 24 hours.   . What Can I do After My Treatment? o Increase your hydration by drinking more water for the next 24 hours. o You may place ice or heat on the areas treated that have become sore, however, do not use heat on inflamed or bruised areas. Heat often brings more relief post needling. o You can continue your regular activities, but vigorous activity is not recommended initially after the treatment for 24 hours. o DN is best combined with other physical therapy such as strengthening, stretching, and other therapies.  Voncille Lo, PT 08/01/2015 10:12 AM Phone: [phone] Fax: [phone]

## 2015-08-01 NOTE — Therapy (Signed)
Erika Osborn, [place], [address] Phone: [phone]   Fax:  [phone]  Physical Therapy Treatment  Patient Details  Name: Erika Osborn MRN: [id number] Date of Birth: [birth year] Referring Provider: Virginia Crews  MD  Encounter Date: 08/01/2015      PT End of Session - 08/01/15 1240    Visit Number 5   Number of Visits 16   Date for PT Re-Evaluation 09/03/15   Authorization Type U[employer]   PT Start Time 0932   PT Stop Time 1030   PT Time Calculation (min) 58 min   Activity Tolerance Patient limited by pain   Behavior During Therapy [facility] for tasks assessed/performed      Past Medical History  Diagnosis Date  . Arthritis   . GERD (gastroesophageal reflux disease)   . HTN (hypertension)   . HLD (hyperlipidemia)   . Fibromyalgia   . Hypothyroidism following radioiodine therapy   . Ulcerative colitis ([place]) 1976    Past Surgical History  Procedure Laterality Date  . Lumbar disc surgery    . Breast reduction surgery      There were no vitals filed for this visit.  Visit Diagnosis:  Abnormal posture  Decreased ROM of left shoulder  Decreased ROM of right shoulder  Pain in right shoulder  Pain in left shoulder  Activity intolerance      Subjective Assessment - 08/01/15 0937    Subjective Last night I was on the floor doing my exericises on the floor with the cane and I had shooting pain in my left arm and I couldnt move it for a bit   Pertinent History fibromyalgia, ulcerative colitis. back surgery 15 years ago  fusion? lived in [place]   Limitations Lifting;House hold activities   How long can you sit comfortably? unlimited   How long can you stand comfortably? unlimited   How long can you walk comfortably? unlimited   Diagnostic tests none   Patient Stated Goals be relievd of pain. care for my daughter without pain,  dress without pain and lift above my head and get things out of  cabinet. and showering back   Currently in Pain? Yes   Pain Score 7    Pain Location Shoulder   Pain Orientation Left   Pain Descriptors / Indicators Aching   Pain Type Chronic pain   Pain Onset More than a month ago   Pain Frequency Constant   Aggravating Factors  sleeping , reaching.    Pain Score 0   Pain Location Shoulder   Pain Orientation Right   Pain Descriptors / Indicators --            [facility] PT Assessment - 08/01/15 0940    AROM   Right Shoulder Flexion 150 Degrees   Right Shoulder ABduction 140 Degrees   Left Shoulder Flexion 128 Degrees  post RX sitting  PROM 142   Left Shoulder ABduction 110 Degrees  post RX  121   Strength   Right Shoulder Flexion 4-/5   Right Shoulder ABduction 4-/5   Left Shoulder Flexion 3-/5   Left Shoulder ABduction 3-/5   Palpation   Palpation comment Pt with tenderness over bilaterall infraspinatus, supraspinatues teres minor ( left marked pain and marked pain over subscapualris bilaterally  .                      [facility] Adult PT Treatment/Exercise - 08/01/15 1010  Manual Therapy   Manual Therapy Joint mobilization;Passive ROM;Soft tissue mobilization;Myofascial release   Joint Mobilization grade 2,3 A/P mobs GH joints bilateral as well as inferior glides , also with abd 45 and distraction IR/ER grade 2/3.    Soft tissue mobilization teres minor, infraspinatus and supraspnatus.subscapularis and lats bil with IASTYM tool in sidelying    Passive ROM Myofascial stretch of above muscles with PT PROM and holding onto specific muscles for added stretch.            Trigger Point Dry Needling - 08/01/15 2831    Consent Given? Yes   Education Handout Provided Yes   Muscles Treated Upper Body Infraspinatus;Supraspinatus;Subscapularis;Levator scapulae;Upper trapezius  Teres minor muscle  TDN only on left   Upper Trapezius Response Twitch reponse elicited;Palpable increased muscle length   Levator Scapulae Response Twitch  response elicited;Palpable increased muscle length   Supraspinatus Response Twitch response elicited;Palpable increased muscle length   Infraspinatus Response Twitch response elicited;Palpable increased muscle length   Subscapularis Response Twitch response elicited;Palpable increased muscle length              PT Education - 08/01/15 1012    Education provided Yes   Education Details Trigger Point dry needling precautian and aftercare  instructed to do supine cane and theraband to protect joing   Person(s) Educated Patient   Methods Explanation   Comprehension Verbalized understanding          PT Short Term Goals - 08/01/15 1304    PT SHORT TERM GOAL #1   Title "Independent with initial HEP 08-09-15   Time 4   Period Weeks   Status Achieved   PT SHORT TERM GOAL #2   Title "Report pain decrease from   8 /10 to   4 /10.   Baseline increased today 7/10 in left shoulder 0/10 in right   Time 4   Period Weeks   Status Partially Met   PT SHORT TERM GOAL #3   Title "Demonstrate understanding of proper sitting posture, body mechanics, work ergonomics, and be more conscious of position and posture throughout the day.    Time 4   Period Weeks   Status Achieved           PT Long Term Goals - 08/01/15 1305    PT LONG TERM GOAL #1   Title "Demonstrate and verbalize techniques to reduce the risk of re-injury including: lifting, posture, body mechanics.    Time 8   Period Weeks   Status On-going   PT LONG TERM GOAL #2   Title "Pt will be independent with advanced HEP   Time 8   Period Weeks   Status On-going   PT LONG TERM GOAL #3   Title "Pain will decrease to 1/10 with all functional activities   Time 8   Period Weeks   Status On-going   PT LONG TERM GOAL #4   Title " bilateral  shoulder AROM scaption will improve to 0-160 degrees for improved overhead reaching.    Time 8   Period Weeks   Status On-going   PT LONG TERM GOAL #5   Title  bilateral shoulder IR and  ER will return to [facility] to return to pain-free ADLs such as dressing and grooming   Time 8   Period Weeks   Status On-going   PT LONG TERM GOAL #6   Title "FOTO will improve from  59%  to   40% limtation  indicating improved functional mobility  Time 8   Period Weeks   Status On-going               Plan - 08/01/15 1243    Clinical Impression Statement Pt worked with standing bil rows and extension but Ms. Hitz experienced increased pain last night and decreased ROM in clinic today.  Pt was instructed to return to supine ROM and strengtheing to mainitain good alignment.  Pt  had reduced AROM initially than last visit.  Pt with marked tenderness over left subscapularis, infraspinatus and supraspinatus and deltoid.  Pt consented to trigger point dry needling  for trigger point pain periscapular muscles.  Pt had immediate release of intense pain and increased AROM but not to level of last treatment.  will keep assessing next visit and work toward HEP that pt will be able to do without pain levle.  left AROM flex 128 AAROM lef 142,  left shld abd 110, AAROM 121  Will continue  all STG met or partially met   Pt will benefit from skilled therapeutic intervention in order to improve on the following deficits Pain;Postural dysfunction;Improper body mechanics;Impaired UE functional use;Increased fascial restricitons;Increased muscle spasms;Decreased strength;Decreased activity tolerance   Rehab Potential Good   PT Frequency 2x / week   PT Duration 8 weeks   PT Treatment/Interventions ADLs/Self Care Home Management;Cryotherapy;Electrical Stimulation;Iontophoresis 34m/ml Dexamethasone;Moist Heat;Therapeutic exercise;Functional mobility training;Ultrasound;Patient/family education;Taping;Dry needling;Passive range of motion;Manual techniques   PT Next Visit Plan  progress to [place] for right shoulder and isometrics or [place] for left as needed, soft tissue on infraspinatus, teres minor and sub  scapularis,  Assess TDN of left shoulder   PT Home Exercise Plan bil scapular strength as able , returning to supine cane and theraband until pain 4/10 or less   Consulted and Agree with Plan of Care Patient        Problem List Patient Active Problem List   Diagnosis Date Noted  . Overweight (BMI 25.0-29.9) 06/25/2015  . Healthcare maintenance 03/08/2015  . 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 Allergic conjunctivitis and rhinitis 01/17/2014  . 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 Fibromyalgia 07/19/2013  . HTN (hypertension) 07/19/2013  . HLD (hyperlipidemia) 07/19/2013  . Ulcerative colitis ([place] 07/19/2013    LVoncille Lo PT 08/01/2015 1:36 PM Phone: [phone]: 3[facility] [place] [address]Phone: [phone]  Fax:  [phone] Name: JMeria CrillyMRN: [id number] of Birth: 6May 10, 1952

## 2015-08-06 ENCOUNTER — Ambulatory Visit: Payer: 59 | Admitting: Physical Therapy

## 2015-08-06 DIAGNOSIS — R293 Abnormal posture: Secondary | ICD-10-CM

## 2015-08-06 DIAGNOSIS — R6889 Other general symptoms and signs: Secondary | ICD-10-CM

## 2015-08-06 DIAGNOSIS — M25511 Pain in right shoulder: Secondary | ICD-10-CM

## 2015-08-06 DIAGNOSIS — M25512 Pain in left shoulder: Secondary | ICD-10-CM

## 2015-08-06 DIAGNOSIS — M25612 Stiffness of left shoulder, not elsewhere classified: Secondary | ICD-10-CM

## 2015-08-06 DIAGNOSIS — M25611 Stiffness of right shoulder, not elsewhere classified: Secondary | ICD-10-CM

## 2015-08-06 NOTE — Therapy (Signed)
[place], [place], [address] Phone: [phone]   Fax:  [phone]  Physical Therapy Treatment  Patient Details  Name: Erika Osborn MRN: [id number] Date of Birth: [birth year] Referring Provider: Virginia Crews  MD  Encounter Date: 08/06/2015      PT End of Session - 08/06/15 0927    Visit Number 6   Number of Visits 16   Date for PT Re-Evaluation 09/03/15   Authorization Type UHC   PT Start Time 0930   PT Stop Time 1049   PT Time Calculation (min) 79 min   Activity Tolerance Patient tolerated treatment well   Behavior During Therapy [facility] for tasks assessed/performed      Past Medical History  Diagnosis Date  . Arthritis   . GERD (gastroesophageal reflux disease)   . HTN (hypertension)   . HLD (hyperlipidemia)   . Fibromyalgia   . Hypothyroidism following radioiodine therapy   . Ulcerative colitis ([place]) 1976    Past Surgical History  Procedure Laterality Date  . Lumbar disc surgery    . Breast reduction surgery      There were no vitals filed for this visit.  Visit Diagnosis:  Abnormal posture  Decreased ROM of left shoulder  Decreased ROM of right shoulder  Pain in right shoulder  Pain in left shoulder  Activity intolerance      Subjective Assessment - 08/06/15 0927    Subjective i cant believe how well I can reach up with my left arm today.     Pertinent History fibromyalgia, ulcerative colitis. back surgery 15 years ago  fusion? lived in [place]   Patient Stated Goals be relievd of pain. care for my daughter without pain,  dress without pain and lift above my head and get things out of cabinet. and showering back   Currently in Pain? Yes   Pain Score 2    Pain Location Shoulder   Pain Orientation Left   Pain Descriptors / Indicators Aching   Pain Type Chronic pain   Pain Onset More than a month ago   Pain Frequency Intermittent   Aggravating Factors  sleeping    Pain Score 3   Pain Location Shoulder   Pain Orientation Right   Pain Descriptors / Indicators Aching   Pain Type Chronic pain   Pain Onset More than a month ago   Pain Frequency Intermittent   Aggravating Factors  sleeping            [facility] PT Assessment - 08/06/15 0938    AROM   Right Shoulder Flexion 154 Degrees   Right Shoulder ABduction 146 Degrees   Left Shoulder Flexion 146 Degrees   Left Shoulder ABduction 112 Degrees  joint stiffness.                     [facility] Adult PT Treatment/Exercise - 08/06/15 0944    Posture/Postural Control   Posture/Postural Control Postural limitations   Postural Limitations Rounded Shoulders;Forward head   Posture Comments Pt was able to verbalize 3 strategies for posture   Self-Care   Self-Care Posture   Posture Pt able to verbalize 3 posture strategies   Shoulder Exercises: Sidelying   External Rotation Strengthening;Left   External Rotation Weight (lbs) 2   Other Sidelying Exercises Sleeper stretch on left sidelying for 3 minutes with VC and TC   Shoulder Exercises: Standing   External Rotation Both;10 reps;Theraband  x3   Theraband Level (Shoulder  External Rotation) Level 2 (Red)   Internal Rotation Right;Left;10 reps;Theraband  x2   Theraband Level (Shoulder Internal Rotation) Level 2 (Red)   Extension Both;10 reps;Theraband  x2   Theraband Level (Shoulder Extension) Level 2 (Red)   Row 10 reps;Both;Theraband   Theraband Level (Shoulder Row) Level 2 (Red)   Shoulder Exercises: Stretch   Corner Stretch 3 reps;30 seconds   Corner Stretch Limitations Doorway pectoral stetch bilaterally   Modalities   Modalities Moist Heat;Iontophoresis   Moist Heat Therapy   Number Minutes Moist Heat 15 Minutes   Moist Heat Location Cervical;Shoulder  bil   Iontophoresis   Type of Iontophoresis Dexamethasone   Location left anterior shoulder   Dose 1cc   Time patch to be removed  after 4-6 hours   Manual Therapy    Manual Therapy Joint mobilization;Passive ROM;Soft tissue mobilization;Myofascial release   Joint Mobilization grade 2,3 A/P mobs GH joints bilateral as well as inferior glides , also with abd 45 and distraction IR/ER grade 2/3.   left abduction with limitation due to pain   Soft tissue mobilization teres minor, infraspinatus and supraspnatus.subscapularis and lats bil with IASTYM tool in sidelying  left pectoral and left ant/middle deltoid    Scapular Mobilization All planes of motion bilaterally pt in prone.          Trigger Point Dry Needling - 08/06/15 1004    Consent Given? Yes   Education Handout Provided No  previously given   Muscles Treated Upper Body Upper trapezius;Rhomboids;Pectoralis major  deltoids  and pec mx L  , all others bil TDN for all muscles   Upper Trapezius Response Twitch reponse elicited;Palpable increased muscle length   Pectoralis Major Response Twitch response elicited;Palpable increased muscle length  left only   Rhomboids Response Twitch response elicited  left only   Supraspinatus Response Twitch response elicited;Palpable increased muscle length   Infraspinatus Response Palpable increased muscle length   Subscapularis Response Twitch response elicited;Palpable increased muscle length              PT Education - 08/06/15 1004    Education provided Yes   Education Details revised HEP with red t band and ionto instructions, posture education   Person(s) Educated Patient   Methods Explanation;Demonstration;Handout;Tactile cues;Verbal cues   Comprehension Verbalized understanding;Returned demonstration          PT Short Term Goals - 08/06/15 0933    PT SHORT TERM GOAL #1   Title "Independent with initial HEP 08-09-15   Time 4   Period Weeks   Status Achieved   PT SHORT TERM GOAL #2   Title "Report pain decrease from   8 /10 to   4 /10.   Baseline Right shoulder 3/10 and left 2/10    Time 4   Period Weeks   Status Achieved   PT SHORT  TERM GOAL #3   Title "Demonstrate understanding of proper sitting posture, body mechanics, work ergonomics, and be more conscious of position and posture throughout the day.    Time 4   Period Weeks   Status Achieved           PT Long Term Goals - 08/06/15 0934    PT LONG TERM GOAL #1   Title "Demonstrate and verbalize techniques to reduce the risk of re-injury including: lifting, posture, body mechanics.    Time 8   Period Weeks   Status Achieved   PT LONG TERM GOAL #2   Title "Pt will be independent  with advanced HEP   Time 8   Period Weeks   Status On-going   PT LONG TERM GOAL #3   Title "Pain will decrease to 1/10 with all functional activities   Time 8   Period Weeks   Status On-going   PT LONG TERM GOAL #4   Title " bilateral  shoulder AROM scaption will improve to 0-160 degrees for improved overhead reaching.    Baseline Right should flex 154, abd 146,  left shoulder flex 146, and abd 112   Time 8   Period Weeks   Status On-going   PT LONG TERM GOAL #5   Title  bilateral shoulder IR and ER will return to [facility] to return to pain-free ADLs such as dressing and grooming   Baseline better but stilll painful   Time 8   Period Weeks   PT LONG TERM GOAL #6   Title "FOTO will improve from  59%  to   40% limtation  indicating improved functional mobility    Time 8   Period Weeks   Status On-going               Plan - 08/06/15 1042    Clinical Impression Statement Pt enters clinic with much improved left shoulder flexion and "high fives" PT upon entry.  Pt recieved benefti from TDN but is now left with anterior left and right shoulder pain. and decreased pain in teres minor and posterior mx of shoudler  AROM shoudler flex Right 154 left 146, shoulder abd Right 146 and left 112.  Pt  is make progress after sporadic progress limited by her fibromyalgia pain.   Will contiue to strengthen and gain rangel functionally in shoulder.  all STG achieved and  and LTG #1  achieved.   Pt will benefit from skilled therapeutic intervention in order to improve on the following deficits Pain;Postural dysfunction;Improper body mechanics;Impaired UE functional use;Increased fascial restricitons;Increased muscle spasms;Decreased strength;Decreased activity tolerance   Rehab Potential Good   PT Frequency 2x / week   PT Duration 8 weeks   PT Treatment/Interventions ADLs/Self Care Home Management;Cryotherapy;Electrical Stimulation;Iontophoresis 33m/ml Dexamethasone;Moist Heat;Therapeutic exercise;Functional mobility training;Ultrasound;Patient/family education;Taping;Dry needling;Passive range of motion;Manual techniques   PT Next Visit Plan Progress closed chain shoulder exericises and strengthe. Assess ionto and TDN   PT Home Exercise Plan Contineu with standing scapular stabillizers with red t band.   Consulted and Agree with Plan of Care Patient        Problem List Patient Active Problem List   Diagnosis Date Noted  . Overweight (BMI 25.0-29.9) 06/25/2015  . Healthcare maintenance 03/08/2015  . 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 Allergic conjunctivitis and rhinitis 01/17/2014  . 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 Fibromyalgia 07/19/2013  . HTN (hypertension) 07/19/2013  . HLD (hyperlipidemia) 07/19/2013  . [place]cerative [place] ([place] 07/19/2013   LVoncille Lo PT 08/06/2015 10:55 AM Phone: [phone]: 3[facility] [address]Phone: [phone]  Fax:  [phone] Name: Erika DifattaMRN: [id number] of Birth: 6Jul 05, 1952

## 2015-08-06 NOTE — Patient Instructions (Addendum)
Sleeper stretch  Lie on left side and bring left shoulder close to 80 degrees.  Place Right hand on left wrist and bring down into internal rotation as shown in clinic.  Hold for 3 minutes and use a moist heat pack for comfort.  Pectoral Stretch    With arms behind doorjamb, gently lean forward. Stretch is felt across chest. Hold _30___ seconds. Repeat __2-3__ times. Do __2__ sessions per day.  http://gt2.exer.us/33   Copyright  VHI. All rights reserved.   IONTOPHORESIS PATIENT PRECAUTIONS & CONTRAINDICATIONS:  . Redness under one or both electrodes can occur.  This characterized by a uniform redness that usually disappears within 12 hours of treatment. . Small pinhead size blisters may result in response to the drug.  Contact your physician if the problem persists more than 24 hours. . On rare occasions, iontophoresis therapy can result in temporary skin reactions such as rash, inflammation, irritation or burns.  The skin reactions may be the result of individual sensitivity to the ionic solution used, the condition of the skin at the start of treatment, reaction to the materials in the electrodes, allergies or sensitivity to dexamethasone, or a poor connection between the patch and your skin.  Discontinue using iontophoresis if you have any of these reactions and report to your therapist. . Remove the Patch or electrodes if you have any undue sensation of pain or burning during the treatment and report discomfort to your therapist. . Tell your Therapist if you have had known adverse reactions to the application of electrical current. . If using the Patch, the LED light will turn off when treatment is complete and the patch can be removed.  Approximate treatment time is 1-3 hours.  Remove the patch when light goes off or after 6 hours. . The Patch can be worn during normal activity, however excessive motion where the electrodes have been placed can cause poor contact between the skin and  the electrode or uneven electrical current resulting in greater risk of skin irritation. Marland Kitchen Keep out of the reach of children.   . DO NOT use if you have a cardiac pacemaker or any other electrically sensitive implanted device. . DO NOT use if you have a known sensitivity to dexamethasone. . DO NOT use during Magnetic Resonance Imaging (MRI). . DO NOT use over broken or compromised skin (e.g. sunburn, cuts, or acne) due to the increased risk of skin reaction. . DO NOT SHAVE over the area to be treated:  To establish good contact between the Patch and the skin, excessive hair may be clipped. . DO NOT place the Patch or electrodes on or over your eyes, directly over your heart, or brain. . DO NOT reuse the Patch or electrodes as this may cause burns to occur.  Do stretch as shown in clinic. Voncille Lo, PT 08/06/2015 10:02 AM Phone: [phone] Fax: [phone]

## 2015-08-08 ENCOUNTER — Ambulatory Visit: Payer: 59 | Admitting: Physical Therapy

## 2015-08-08 DIAGNOSIS — M25511 Pain in right shoulder: Secondary | ICD-10-CM

## 2015-08-08 DIAGNOSIS — M25611 Stiffness of right shoulder, not elsewhere classified: Secondary | ICD-10-CM

## 2015-08-08 DIAGNOSIS — R293 Abnormal posture: Secondary | ICD-10-CM | POA: Diagnosis not present

## 2015-08-08 DIAGNOSIS — R6889 Other general symptoms and signs: Secondary | ICD-10-CM

## 2015-08-08 DIAGNOSIS — M25512 Pain in left shoulder: Secondary | ICD-10-CM

## 2015-08-08 DIAGNOSIS — M25612 Stiffness of left shoulder, not elsewhere classified: Secondary | ICD-10-CM

## 2015-08-08 NOTE — Therapy (Signed)
[place], [place], [address] Phone: [phone]   Fax:  [phone]  Physical Therapy Treatment  Patient Details  Name: Erika Osborn MRN: [id number] Date of Birth: [birth year] Referring Provider: Virginia Crews  MD  Encounter Date: 08/08/2015      PT End of Session - 08/08/15 0932    Visit Number 7   Number of Visits 16   Date for PT Re-Evaluation 09/03/15   Authorization Type UHC   PT Start Time 0932   PT Stop Time 1015   PT Time Calculation (min) 43 min      Past Medical History  Diagnosis Date  . Arthritis   . GERD (gastroesophageal reflux disease)   . HTN (hypertension)   . HLD (hyperlipidemia)   . Fibromyalgia   . Hypothyroidism following radioiodine therapy   . Ulcerative colitis ([place]) 1976    Past Surgical History  Procedure Laterality Date  . Lumbar disc surgery    . Breast reduction surgery      There were no vitals filed for this visit.  Visit Diagnosis:  Abnormal posture  Decreased ROM of left shoulder  Decreased ROM of right shoulder  Pain in right shoulder  Pain in left shoulder  Activity intolerance      Subjective Assessment - 08/08/15 0932    Subjective I still have problems with sleeping, "pain city"   Currently in Pain? Yes   Pain Score 1    Pain Location Shoulder   Pain Orientation Right;Left                         [facility] Adult PT Treatment/Exercise - 08/08/15 0001    Shoulder Exercises: Supine   Other Supine Exercises supine scap stab series including horizontal abduction, pullovers, ER, diagonals x 10 each-fatigue on left, mild increase in pain  cues to control motion    Shoulder Exercises: Standing   External Rotation Right;Left;20 reps;Theraband   Theraband Level (Shoulder External Rotation) Level 2 (Red)   Internal Rotation Right;Left;Theraband;20 reps   Theraband Level (Shoulder Internal Rotation) Level 2 (Red)   Internal  Rotation Limitations cues to control motion   Flexion Right;Left;10 reps;Theraband   Theraband Level (Shoulder Flexion) Level 2 (Red)   Extension 20 reps;Theraband   Theraband Level (Shoulder Extension) Level 2 (Red)   Row 20 reps   Theraband Level (Shoulder Row) Level 2 (Red)   Other Standing Exercises standing counter push ups x 10 no increased pain   Shoulder Exercises: ROM/Strengthening   UBE (Upper Arm Bike) Level 1 2 minutes forward, trial of backward caused increased left shoulder pain and pt c/o hand pain due to arthritis.    Modalities   Modalities Ultrasound   Ultrasound   Ultrasound Location Left anteriolateral shoulder   Ultrasound Parameters 20% .8 w/cm2 1 mhz x 25mn   Ultrasound Goals Pain   Iontophoresis   Type of Iontophoresis Dexamethasone   Location left anterior shoulder   Dose 1cc   Time patch to be removed  after 4-6 hours                  PT Short Term Goals - 08/06/15 0933    PT SHORT TERM GOAL #1   Title "Independent with initial HEP 08-09-15   Time 4   Period Weeks   Status Achieved   PT SHORT TERM GOAL #2   Title "Report pain decrease from   8 /10  to   4 /10.   Baseline Right shoulder 3/10 and left 2/10    Time 4   Period Weeks   Status Achieved   PT SHORT TERM GOAL #3   Title "Demonstrate understanding of proper sitting posture, body mechanics, work ergonomics, and be more conscious of position and posture throughout the day.    Time 4   Period Weeks   Status Achieved           PT Long Term Goals - 08/06/15 0934    PT LONG TERM GOAL #1   Title "Demonstrate and verbalize techniques to reduce the risk of re-injury including: lifting, posture, body mechanics.    Time 8   Period Weeks   Status Achieved   PT LONG TERM GOAL #2   Title "Pt will be independent with advanced HEP   Time 8   Period Weeks   Status On-going   PT LONG TERM GOAL #3   Title "Pain will decrease to 1/10 with all functional activities   Time 8   Period  Weeks   Status On-going   PT LONG TERM GOAL #4   Title " bilateral  shoulder AROM scaption will improve to 0-160 degrees for improved overhead reaching.    Baseline Right should flex 154, abd 146,  left shoulder flex 146, and abd 112   Time 8   Period Weeks   Status On-going   PT LONG TERM GOAL #5   Title  bilateral shoulder IR and ER will return to [facility] to return to pain-free ADLs such as dressing and grooming   Baseline better but stilll painful   Time 8   Period Weeks   PT LONG TERM GOAL #6   Title "FOTO will improve from  59%  to   40% limtation  indicating improved functional mobility    Time 8   Period Weeks   Status On-going               Plan - 08/08/15 1020    Clinical Impression Statement Trial of UBE with good tolerance to forward and increased left shoulder pain/hand pain with reverse. Countinued standing bilateral shoulder strengthening with red band and pt requiring cues throughpout for correct technique and for eccentric control. Trial of standing counter push ups with no increased pain. Supine yellow band strengthening with mild increased pain with over head motions on left shoulder. Trial of ultrasound to decrease pain and inflammation left shoulder as well as iontophoresis patch to left shoulder. Also advised pt to try ice before sleep and upon waking with bilateral shoulder pain.    PT Next Visit Plan Progress closed chain shoulder exericises and strength- update HEP . Assess ionto and ultrasound. Did she try ice at bedtime?        Problem List Patient Active Problem List   Diagnosis Date Noted  . Overweight (BMI 25.0-29.9) 06/25/2015  . Healthcare maintenance 03/08/2015  . 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 Allergic conjunctivitis and rhinitis 01/17/2014  . 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 Fibromyalgia 07/19/2013  . HTN (hypertension) 07/19/2013  . HLD (hyperlipidemia) 07/19/2013  . Ulcerative colitis ([place]) 07/19/2013    Dorene Ar, PTA 08/08/2015, 10:25 AM  [facility] [address] [place], [place], [address] Phone: [phone]   Fax:  [phone]  Name: Erika Osborn MRN: [id number] Date of Birth: 1950-09-02

## 2015-08-12 ENCOUNTER — Ambulatory Visit: Payer: 59 | Admitting: Physical Therapy

## 2015-08-12 DIAGNOSIS — M25512 Pain in left shoulder: Secondary | ICD-10-CM

## 2015-08-12 DIAGNOSIS — R293 Abnormal posture: Secondary | ICD-10-CM

## 2015-08-12 DIAGNOSIS — M25611 Stiffness of right shoulder, not elsewhere classified: Secondary | ICD-10-CM

## 2015-08-12 DIAGNOSIS — M25511 Pain in right shoulder: Secondary | ICD-10-CM

## 2015-08-12 DIAGNOSIS — R6889 Other general symptoms and signs: Secondary | ICD-10-CM

## 2015-08-12 DIAGNOSIS — M25612 Stiffness of left shoulder, not elsewhere classified: Secondary | ICD-10-CM

## 2015-08-12 NOTE — Therapy (Signed)
[place], [place], [address] Phone: [phone]   Fax:  [phone]  Physical Therapy Treatment  Patient Details  Name: Erika Osborn MRN: [id number] Date of Birth: 08-06-1950 Referring Provider: Virginia Crews  MD  Encounter Date: 08/12/2015      PT End of Session - 08/12/15 1332    Visit Number 8   Number of Visits 16   Date for PT Re-Evaluation 09/03/15   Authorization Type UHC   PT Start Time 0130   PT Stop Time 0215   PT Time Calculation (min) 45 min      Past Medical History  Diagnosis Date  . Arthritis   . GERD (gastroesophageal reflux disease)   . HTN (hypertension)   . HLD (hyperlipidemia)   . Fibromyalgia   . Hypothyroidism following radioiodine therapy   . Ulcerative colitis ([place]) 1976    Past Surgical History  Procedure Laterality Date  . Lumbar disc surgery    . Breast reduction surgery      There were no vitals filed for this visit.  Visit Diagnosis:  Abnormal posture  Decreased ROM of left shoulder  Decreased ROM of right shoulder  Pain in right shoulder  Pain in left shoulder  Activity intolerance      Subjective Assessment - 08/12/15 1425    Subjective Pain only with reaching up.             [facility] PT Assessment - 08/12/15 0001    AROM   Left Shoulder Flexion 132 Degrees   Left Shoulder ABduction 134 Degrees                     [facility] Adult PT Treatment/Exercise - 08/12/15 0001    Shoulder Exercises: Standing   External Rotation Right;Left;20 reps;Theraband   Theraband Level (Shoulder External Rotation) Level 2 (Red)   Internal Rotation Right;Left;Theraband;20 reps   Theraband Level (Shoulder Internal Rotation) Level 2 (Red)   Flexion Right;Left;10 reps;Theraband   Theraband Level (Shoulder Flexion) Level 2 (Red)   Extension 20 reps;Theraband   Theraband Level (Shoulder Extension) Level 2 (Red)   Row 20 reps   Theraband Level  (Shoulder Row) Level 2 (Red)   Other Standing Exercises standing red band scaption facing and sliding on wall, 5 reps x 2 each, then back against wall horizontal abduction and diagonals with yellow band x 10 each, no increased pain   Other Standing Exercises standing counter push ups x 10 no increased pain, then forearms on wall  protraction/retraction with max cues to keep cervical neutral   Shoulder Exercises: ROM/Strengthening   UBE (Upper Arm Bike) Level 1 4 minutes forward,  2 minutes backwwards with no pain increase today   Other ROM/Strengthening Exercises wall flexion stretch 10 sec x3 left   Ultrasound   Ultrasound Parameters 20% 1 mhz .8w.cm2   Ultrasound Goals Pain   Iontophoresis   Type of Iontophoresis Dexamethasone   Location left anterior shoulder   Dose 1cc   Time patch to be removed  after 4-6 hours                PT Education - 08/12/15 1428    Education provided Yes   Person(s) Educated Patient   Methods Explanation   Comprehension Verbalized understanding;Returned demonstration          PT Short Term Goals - 08/06/15 0933    PT SHORT TERM GOAL #1   Title "Independent with initial  HEP 08-09-15   Time 4   Period Weeks   Status Achieved   PT SHORT TERM GOAL #2   Title "Report pain decrease from   8 /10 to   4 /10.   Baseline Right shoulder 3/10 and left 2/10    Time 4   Period Weeks   Status Achieved   PT SHORT TERM GOAL #3   Title "Demonstrate understanding of proper sitting posture, body mechanics, work ergonomics, and be more conscious of position and posture throughout the day.    Time 4   Period Weeks   Status Achieved           PT Long Term Goals - 08/06/15 0934    PT LONG TERM GOAL #1   Title "Demonstrate and verbalize techniques to reduce the risk of re-injury including: lifting, posture, body mechanics.    Time 8   Period Weeks   Status Achieved   PT LONG TERM GOAL #2   Title "Pt will be independent with advanced HEP   Time 8    Period Weeks   Status On-going   PT LONG TERM GOAL #3   Title "Pain will decrease to 1/10 with all functional activities   Time 8   Period Weeks   Status On-going   PT LONG TERM GOAL #4   Title " bilateral  shoulder AROM scaption will improve to 0-160 degrees for improved overhead reaching.    Baseline Right should flex 154, abd 146,  left shoulder flex 146, and abd 112   Time 8   Period Weeks   Status On-going   PT LONG TERM GOAL #5   Title  bilateral shoulder IR and ER will return to [facility] to return to pain-free ADLs such as dressing and grooming   Baseline better but stilll painful   Time 8   Period Weeks   PT LONG TERM GOAL #6   Title "FOTO will improve from  59%  to   40% limtation  indicating improved functional mobility    Time 8   Period Weeks   Status On-going               Plan - 08/12/15 1332    Clinical Impression Statement 90% improvement per patient. Up to 3-4/10 pain with functional activities like reaching in cabinets and microwave. She tried ice one time. Repports pain at night not as bad last night.  Abduction AROM improved. No increased pain with UBE today. Pt feels ultrasound and patch were helpful last visit. Repeated this visit. Instructed pt in standing scap stab exercises with no increased shoulder pain. Added to HEP.    PT Next Visit Plan Check shoulder strength, check IR/ER ROM, add closed chain to HEP, FOTO        Problem List Patient Active Problem List   Diagnosis Date Noted  . Overweight (BMI 25.0-29.9) 06/25/2015  . Healthcare maintenance 03/08/2015  . 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 Allergic conjunctivitis and rhinitis 01/17/2014  . 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 Fibromyalgia 07/19/2013  . HTN (hypertension) 07/19/2013  . HLD (hyperlipidemia) 07/19/2013  . Ulcerative colitis ([place]) 07/19/2013    Dorene Ar, PTA 08/12/2015, 2:30 PM  [facility] [address] [place], [place], [address] Phone: [phone]   Fax:  [phone]  Name: Kesley Mullens MRN: [id number] Date of Birth: May 19, 1951

## 2015-08-12 NOTE — Patient Instructions (Signed)
Perform standing horizontal abduction per handout with yellow band 2 x per day, 10 reps, 1 set Perform standing diagonal/flexion per handout with yellow band 2 x per day, 10 reps, 1 set  Perform wall slide and hold stretch for flexion 30 seconds x 3 on left 2 x per day.

## 2015-08-13 ENCOUNTER — Ambulatory Visit: Payer: 59 | Admitting: Physical Therapy

## 2015-08-13 DIAGNOSIS — R6889 Other general symptoms and signs: Secondary | ICD-10-CM

## 2015-08-13 DIAGNOSIS — M25512 Pain in left shoulder: Secondary | ICD-10-CM

## 2015-08-13 DIAGNOSIS — M25511 Pain in right shoulder: Secondary | ICD-10-CM

## 2015-08-13 DIAGNOSIS — M25611 Stiffness of right shoulder, not elsewhere classified: Secondary | ICD-10-CM

## 2015-08-13 DIAGNOSIS — M25612 Stiffness of left shoulder, not elsewhere classified: Secondary | ICD-10-CM

## 2015-08-13 DIAGNOSIS — R293 Abnormal posture: Secondary | ICD-10-CM

## 2015-08-14 NOTE — Therapy (Signed)
[place], [place], [address] Phone: [phone]   Fax:  [phone]  Physical Therapy Treatment  Patient Details  Name: Erika Osborn MRN: [id number] Date of Birth: 22-Jan-1951 Referring Provider: Virginia Crews  MD  Encounter Date: 08/13/2015      PT End of Session - 08/13/15 1023    Visit Number 9   Number of Visits 16   Date for PT Re-Evaluation 09/03/15   PT Start Time 3474   PT Stop Time 1100   PT Time Calculation (min) 45 min      Past Medical History  Diagnosis Date  . Arthritis   . GERD (gastroesophageal reflux disease)   . HTN (hypertension)   . HLD (hyperlipidemia)   . Fibromyalgia   . Hypothyroidism following radioiodine therapy   . Ulcerative colitis ([place]) 1976    Past Surgical History  Procedure Laterality Date  . Lumbar disc surgery    . Breast reduction surgery      There were no vitals filed for this visit.  Visit Diagnosis:  Abnormal posture  Decreased ROM of left shoulder  Decreased ROM of right shoulder  Pain in right shoulder  Pain in left shoulder  Activity intolerance      Subjective Assessment - 08/13/15 1021    Subjective I did fine with the exercise yesterday. A little pain on the left shoulder. My right shoulder is fine. I woke up on my left side and it was a 2/10.   Currently in Pain? Yes   Pain Score 1    Pain Location Shoulder   Pain Orientation Left   Aggravating Factors  sleepin gon left side   Pain Relieving Factors not sleeping on side                         [facility] Adult PT Treatment/Exercise - 08/13/15 1045    Shoulder Exercises: Standing   External Rotation Right;Left;20 reps;Theraband   Theraband Level (Shoulder External Rotation) Level 2 (Red)   Internal Rotation Right;Left;Theraband;20 reps   Theraband Level (Shoulder Internal Rotation) Level 2 (Red)   Flexion Right;Left;10 reps;Theraband   Theraband Level  (Shoulder Flexion) Level 2 (Red)   Extension 20 reps;Theraband   Theraband Level (Shoulder Extension) Level 2 (Red)   Row 20 reps   Theraband Level (Shoulder Row) Level 2 (Red)   Other Standing Exercises standing yellow band scaption facing and sliding on wall, 5 reps x 2 each, then back against wall horizontal abduction and diagonals with yellow band x 10 each, no increased pain   Other Standing Exercises standing counter push ups x 10 no increased pain, then forearms on wall  protraction/retraction with max cues to keep cervical neutral   Shoulder Exercises: ROM/Strengthening   UBE (Upper Arm Bike) Level 1.5 4 min forward, 3 min backward   Other ROM/Strengthening Exercises wall flexion stretch 10 sec x3 left   Ultrasound   Ultrasound Location --   Ultrasound Parameters --   Ultrasound Goals --   Iontophoresis   Type of Iontophoresis Dexamethasone   Location left anterior shoulder   Dose 1cc   Time patch to be removed  after 4-6 hours                  PT Short Term Goals - 08/06/15 0933    PT SHORT TERM GOAL #1   Title "Independent with initial HEP 08-09-15   Time 4  Period Weeks   Status Achieved   PT SHORT TERM GOAL #2   Title "Report pain decrease from   8 /10 to   4 /10.   Baseline Right shoulder 3/10 and left 2/10    Time 4   Period Weeks   Status Achieved   PT SHORT TERM GOAL #3   Title "Demonstrate understanding of proper sitting posture, body mechanics, work ergonomics, and be more conscious of position and posture throughout the day.    Time 4   Period Weeks   Status Achieved           PT Long Term Goals - 08/06/15 0934    PT LONG TERM GOAL #1   Title "Demonstrate and verbalize techniques to reduce the risk of re-injury including: lifting, posture, body mechanics.    Time 8   Period Weeks   Status Achieved   PT LONG TERM GOAL #2   Title "Pt will be independent with advanced HEP   Time 8   Period Weeks   Status On-going   PT LONG TERM GOAL #3    Title "Pain will decrease to 1/10 with all functional activities   Time 8   Period Weeks   Status On-going   PT LONG TERM GOAL #4   Title " bilateral  shoulder AROM scaption will improve to 0-160 degrees for improved overhead reaching.    Baseline Right should flex 154, abd 146,  left shoulder flex 146, and abd 112   Time 8   Period Weeks   Status On-going   PT LONG TERM GOAL #5   Title  bilateral shoulder IR and ER will return to [facility] to return to pain-free ADLs such as dressing and grooming   Baseline better but stilll painful   Time 8   Period Weeks   PT LONG TERM GOAL #6   Title "FOTO will improve from  59%  to   40% limtation  indicating improved functional mobility    Time 8   Period Weeks   Status On-going               Plan - 08/13/15 0934    Clinical Impression Statement Review of HEP to date. Added closed chain without increased pain. Pt requires cues to remain neutral with closed chain exercises.    PT Next Visit Plan FOTO, review closed chain        Problem List Patient Active Problem List   Diagnosis Date Noted  . Overweight (BMI 25.0-29.9) 06/25/2015  . Healthcare maintenance 03/08/2015  . 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 Allergic conjunctivitis and rhinitis 01/17/2014  . 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 Fibromyalgia 07/19/2013  . HTN (hypertension) 07/19/2013  . HLD (hyperlipidemia) 07/19/2013  . Ulcerative colitis ([place]) 07/19/2013    Dorene Ar, PTA 08/14/2015, 9:37 AM  [place], [place], [address] Phone: [phone]   Fax:  [phone]  Name: Gurtha Picker MRN: [id number] Date of Birth: 07-24-1950

## 2015-08-20 ENCOUNTER — Ambulatory Visit: Payer: 59 | Admitting: Physical Therapy

## 2015-08-20 DIAGNOSIS — M25612 Stiffness of left shoulder, not elsewhere classified: Secondary | ICD-10-CM

## 2015-08-20 DIAGNOSIS — R6889 Other general symptoms and signs: Secondary | ICD-10-CM

## 2015-08-20 DIAGNOSIS — R293 Abnormal posture: Secondary | ICD-10-CM

## 2015-08-20 DIAGNOSIS — M25512 Pain in left shoulder: Secondary | ICD-10-CM

## 2015-08-20 DIAGNOSIS — M25611 Stiffness of right shoulder, not elsewhere classified: Secondary | ICD-10-CM

## 2015-08-20 DIAGNOSIS — M25511 Pain in right shoulder: Secondary | ICD-10-CM

## 2015-08-20 NOTE — Therapy (Signed)
[place], [place], [address] Phone: [phone]   Fax:  [phone]  Physical Therapy Treatment/Discharge Note  Patient Details  Name: Erika Osborn MRN: [id number] Date of Birth: [birth year] Referring Provider: Virginia Crews  MD  Encounter Date: 08/20/2015      PT End of Session - 08/20/15 1027    Visit Number 10   Number of Visits 16   Date for PT Re-Evaluation 09/03/15   Authorization Type [employer]   PT Start Time 2947   PT Stop Time 1059   PT Time Calculation (min) 44 min   Activity Tolerance Patient tolerated treatment well   Behavior During Therapy [facility] for tasks assessed/performed      Past Medical History  Diagnosis Date  . Arthritis   . GERD (gastroesophageal reflux disease)   . HTN (hypertension)   . HLD (hyperlipidemia)   . Fibromyalgia   . Hypothyroidism following radioiodine therapy   . Ulcerative colitis ([place]) 1976    Past Surgical History  Procedure Laterality Date  . Lumbar disc surgery    . Breast reduction surgery      There were no vitals filed for this visit.  Visit Diagnosis:  Abnormal posture  Decreased ROM of left shoulder  Decreased ROM of right shoulder  Pain in right shoulder  Pain in left shoulder  Activity intolerance      Subjective Assessment - 08/20/15 1018    Subjective The most my pain is now is a 1/10.  I am only having trouble sleeping on my left arms, sometimes my right   Pertinent History fibromyalgia, ulcerative colitis. back surgery 15 years ago  fusion? lived in [place]   How long can you sit comfortably? unlimited   How long can you stand comfortably? unlimited   How long can you walk comfortably? unlimited   Diagnostic tests none   Patient Stated Goals be relievd of pain. care for my daughter without pain,  dress without pain and lift above my head and get things out of cabinet. and showering back   Currently in Pain? Yes   Pain  Score 1    Pain Location Shoulder   Pain Orientation Left   Pain Descriptors / Indicators Aching   Pain Type Chronic pain   Pain Onset More than a month ago   Pain Frequency Intermittent   Pain Score 1   Pain Location Shoulder   Pain Orientation Right   Pain Descriptors / Indicators Aching   Pain Type Chronic pain   Pain Onset More than a month ago   Pain Frequency Intermittent            [facility] PT Assessment - 08/20/15 1038    Observation/Other Assessments   Focus on Therapeutic Outcomes (FOTO)  intake 67% limtation 33% predicted 40%   AROM   Right Shoulder Flexion 157 Degrees   Right Shoulder ABduction 150 Degrees   Right Shoulder Internal Rotation 65 Degrees   Right Shoulder External Rotation 78 Degrees   Left Shoulder Flexion 142 Degrees   Left Shoulder ABduction 136 Degrees   Left Shoulder Internal Rotation 70 Degrees   Left Shoulder External Rotation 50 Degrees   Strength   Right Shoulder Flexion 4+/5   Right Shoulder ABduction 4+/5   Right Shoulder Internal Rotation 4+/5   Right Shoulder External Rotation 4+/5   Left Shoulder Flexion 4/5   Left Shoulder ABduction 4/5   Left Shoulder Internal Rotation 4/5   Left Shoulder  External Rotation 4-/5   Palpation   Palpation comment minimal tendernsss over bil subscapularis                     [facility] Adult PT Treatment/Exercise - 08/20/15 1209    Self-Care   Self-Care Other Self-Care Comments   Other Self-Care Comments  use of tennis ball for self myofascial release of sub scapularis lateral with use of arm flexion as well as washcloths under arms for sleep and night   Shoulder Exercises: Standing   External Rotation Both;Theraband   Theraband Level (Shoulder External Rotation) Level 2 (Red)   Extension 20 reps;Theraband   Theraband Level (Shoulder Extension) Level 2 (Red)   Row 20 reps   Theraband Level (Shoulder Row) Level 2 (Red)   Other Standing Exercises standing yellow band scaption facing and  sliding on wall, 5 reps x 2 each, then back against wall horizontal abduction and diagonals with yellow band x 10 each, no increased pain   Shoulder Exercises: ROM/Strengthening   Other ROM/Strengthening Exercises wall flexion stretch 10 sec x3 left                PT Education - 08/20/15 1212    Education provided Yes   Education Details Pt reviewed and streamlined HEP for home use ot 5-6 exericises only and given written handout detailing last HEP given, use of tennis ball for self myofascilal release , community wellness with aquatics for fibromyalgia   Person(s) Educated Patient   Methods Explanation;Demonstration;Verbal cues;Handout  written directions detailing former exericises   Comprehension Verbalized understanding;Returned demonstration          PT Short Term Goals - 08/06/15 0933    PT SHORT TERM GOAL #1   Title "Independent with initial HEP 08-09-15   Time 4   Period Weeks   Status Achieved   PT SHORT TERM GOAL #2   Title "Report pain decrease from   8 /10 to   4 /10.   Baseline Right shoulder 3/10 and left 2/10    Time 4   Period Weeks   Status Achieved   PT SHORT TERM GOAL #3   Title "Demonstrate understanding of proper sitting posture, body mechanics, work ergonomics, and be more conscious of position and posture throughout the day.    Time 4   Period Weeks   Status Achieved           PT Long Term Goals - 08/20/15 1036    PT LONG TERM GOAL #1   Title "Demonstrate and verbalize techniques to reduce the risk of re-injury including: lifting, posture, body mechanics.    Time 8   Period Weeks   Status Achieved   PT LONG TERM GOAL #2   Title "Pt will be independent with advanced HEP   Time 8   Period Weeks   Status On-going   PT LONG TERM GOAL #3   Title "Pain will decrease to 1/10 with all functional activities   Time 8   Period Weeks   Status On-going   PT LONG TERM GOAL #4   Title " bilateral  shoulder AROM scaption will improve to 0-160  degrees for improved overhead reaching.    Baseline Right shoudler flex 157 , abd 150, left shoulder flex 142, and abd  136   Status Partially Met   PT LONG TERM GOAL #5   Title  bilateral shoulder IR and ER will return to [facility] to return to pain-free ADLs such as  dressing and grooming   Baseline able to attach bra and perform ADL's with greater ease   Time 8   Period Weeks   Status Achieved   PT LONG TERM GOAL #6   Title "FOTO will improve from  59%  to   40% limtation  indicating improved functional mobility    Baseline FOTO 08-20-15  33% limitation   Time 8   Period Weeks   Status Achieved               Plan - 08/20/15 1051    Clinical Impression Statement Pt is pleased with current level of progress and has achieved all goals in PT.  Pt is independent with HEP and is able to raise bil arms overhead with ease.  Still has 1/10 pain when lying on shoulders but is going to use pain management strategy given today.  Pt with increased AROM in bil arms. all UE at least 4 to 4+/5 except for left External rotation 4-/5  See Assessment.  All Goals achieved and pt is ready for DC Pt  was given information for aquatic program for fibromyalgia and was encourgaged to join to continue exeericise for over all health.      PT Next Visit Plan DC   PT Home Exercise Plan continue HEP        Problem List Patient Active Problem List   Diagnosis Date Noted  . Overweight (BMI 25.0-29.9) 06/25/2015  . Healthcare maintenance 03/08/2015  . 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 Allergic conjunctivitis and rhinitis 01/17/2014  . 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 Fibromyalgia 07/19/2013  . HTN (hypertension) 07/19/2013  . HLD (hyperlipidemia) 07/19/2013  . Ulcerative colitis ([place]) 07/19/2013   Voncille Lo, PT 08/20/2015 12:23 PM Phone: [phone] Fax: [facility] [place], [place],  [address] Phone: [phone]   Fax:  [phone]  Name: Erika Osborn MRN: [id number] Date of Birth: [birth year]   PHYSICAL THERAPY DISCHARGE SUMMARY  Visits from Start of Care: 10  Current functional level related to goals / functional outcomes:  See above , 1/10 pain in bil shoulders   Remaining deficits: Pain with end range flexionion but improved. Pt able to raise arms overhead with ease.  33% limitation but goal was 40 % on FOTO  Left Exteranal rotation 4-/5  All else is 4 to 4+/5   Education / Equipment: HEP, community wellness information for [employer] fibromyalgia program Plan: Patient agrees to discharge.  Patient goals were met. Patient is being discharged due to meeting the stated rehab goals.  ????? and being pleased with current functional progress        Voncille Lo, PT 08/20/2015 12:23 PM Phone: [phone] Fax: [phone]

## 2015-08-22 ENCOUNTER — Encounter: Payer: 59 | Admitting: Physical Therapy

## 2015-08-26 ENCOUNTER — Other Ambulatory Visit: Payer: Self-pay | Admitting: Family Medicine

## 2015-08-27 ENCOUNTER — Encounter: Payer: 59 | Admitting: Physical Therapy

## 2015-08-28 MED ORDER — LEVOTHYROXINE SODIUM 100 MCG PO TABS: 100.0000 ug | ORAL_TABLET | Freq: Every day | ORAL | Status: DC

## 2015-08-28 NOTE — Telephone Encounter (Signed)
Please let patient know that tramadol Rx available to pick up at front desk. Thanks!  Virginia Crews, MD, MPH PGY-2,  [facility] 08/28/2015 9:06 AM

## 2015-08-28 NOTE — Telephone Encounter (Signed)
Patient informed, will be by tomorrow to pick up rx.

## 2015-08-29 ENCOUNTER — Encounter: Payer: 59 | Admitting: Physical Therapy

## 2015-11-22 ENCOUNTER — Ambulatory Visit (INDEPENDENT_AMBULATORY_CARE_PROVIDER_SITE_OTHER): Payer: 59 | Admitting: Family Medicine

## 2015-11-22 ENCOUNTER — Encounter: Payer: Self-pay | Admitting: Family Medicine

## 2015-11-22 VITALS — BP 148/82 | HR 77 | Temp 98.1°F | Ht 60.0 in | Wt 148.0 lb

## 2015-11-22 DIAGNOSIS — M797 Fibromyalgia: Secondary | ICD-10-CM | POA: Diagnosis not present

## 2015-11-22 DIAGNOSIS — B351 Tinea unguium: Secondary | ICD-10-CM

## 2015-11-22 DIAGNOSIS — E890 Postprocedural hypothyroidism: Secondary | ICD-10-CM

## 2015-11-22 DIAGNOSIS — B070 Plantar wart: Secondary | ICD-10-CM | POA: Diagnosis not present

## 2015-11-22 MED ORDER — TRAMADOL HCL 50 MG PO TABS: 100.0000 mg | ORAL_TABLET | Freq: Four times a day (QID) | ORAL | Status: DC | PRN

## 2015-11-22 MED ORDER — TERBINAFINE HCL 250 MG PO TABS: 250.0000 mg | ORAL_TABLET | Freq: Every day | ORAL | Status: DC

## 2015-11-22 NOTE — Patient Instructions (Signed)

## 2015-11-22 NOTE — Assessment & Plan Note (Signed)
Advised patient to schedule procedure visit for removal

## 2015-11-22 NOTE — Progress Notes (Signed)
Subjective:   Erika Osborn is a 65 y.o. female with a history of HTN, hypothyroidism, fibromyalgia, HLD here for fibromyalgia follow-up   Fibromyalgia - having more pain in b/l hands and in back - has some stiffness associated - s/p back surgery in 2004 and known scoliosis - tramadol seems to relieve legs aching from fibromyalgia - taking 125m TID - tylenol helps with back pain - hand pain started in last year - worse in last month - hand pain worse with bending hands back or gripping things - thinks its arthritis - stopped gabapentin - too drowsy - worse in rain and cold  Hypothyroidism - taking synthroid 108m daily - no problems with hair or skin changes, diarrhea, constipation - no change in dose in many years  Nail fungus - Patient reports yellowing and darkening of some nails on bilateral hands for many years - Reports that it looks much worse now and is concerning to her - She is tried multiple over-the-counter treatments in the past and they have not worked  Plantar warts - Patient had plantar warts frozen and scraped about one year ago and they have improved but they have worsened and become painful again within last few months  Review of Systems:  Per HPI.   Social History: Never smoker  Objective:  BP 148/82 mmHg  Pulse 77  Temp(Src) 98.1 F (36.7 C) (Oral)  Ht 5' (1.524 m)  Wt 148 lb (67.132 kg)  BMI 28.90 kg/m2  Gen:  [age].o. female in NAD HEENT: NCAT, MMM, EOMI, PERRL, anicteric sclerae Neck: Supple, no LAD, no thyromegaly CV: RRR, no MRG Resp: Non-labored, CTAB, no wheezes noted Ext: WWP, no edema MSK: Yellow and green discoloration of bilateral thumbnails, some thickening of nails as well. Left foot with 4-5 plantar warts that are tender to palpation, right foot with one possible plantar wart. Back with noted scoliosis. Hands with no effusions or redness of joints, no tenderness to palpation, good grip strength Neuro: Alert and oriented, speech  normal      Chemistry      Component Value Date/Time   NA 138 07/19/2013 1427   K 3.8 07/19/2013 1427   CL 99 07/19/2013 1427   CO2 33* 07/19/2013 1427   BUN 12 07/19/2013 1427   CREATININE 0.60 07/19/2013 1427      Component Value Date/Time   CALCIUM 9.7 07/19/2013 1427      Lab Results  Component Value Date   WBC 5.1 07/19/2013   HGB 12.9 07/19/2013   HCT 38.2 07/19/2013   MCV 93.2 07/19/2013   PLT 289 07/19/2013   Lab Results  Component Value Date   TSH 0.702 02/13/2015   Lab Results  Component Value Date   HGBA1C 5.5 06/25/2015   Assessment & Plan:     JoFerol Laiches a [age].o. female here for   Hypothyroidism, postradioiodine therapy Previously well-controlled Recheck TSH today Continue Synthroid 100 g daily  Fibromyalgia Patient no longer able to tolerate gabapentin and self discontinued She has been resistant to trying other medications for controlling Continue tramadol and Tylenol as needed for pain control Suspect patient also has osteoarthritic changes in her hands and back contributing to pain Advised regular exercise  Onychomycosis Exam consistent with the onychomycosis of bilateral thumbnails Lamisil orally for 6 weeks for treatment  Plantar wart of left foot Advised patient to schedule procedure visit for removal    AnVirginia CrewsMD MPH PGY-2,  [facility] 11/22/2015  11:24 AM

## 2015-11-22 NOTE — Assessment & Plan Note (Signed)
Patient no longer able to tolerate gabapentin and self discontinued She has been resistant to trying other medications for controlling Continue tramadol and Tylenol as needed for pain control Suspect patient also has osteoarthritic changes in her hands and back contributing to pain Advised regular exercise

## 2015-11-22 NOTE — Assessment & Plan Note (Signed)
Exam consistent with the onychomycosis of bilateral thumbnails Lamisil orally for 6 weeks for treatment

## 2015-11-22 NOTE — Assessment & Plan Note (Signed)
Previously well-controlled Recheck TSH today Continue Synthroid 100 g daily

## 2015-11-23 LAB — TSH: TSH: 1.47 m[IU]/L

## 2015-11-25 ENCOUNTER — Telehealth: Payer: Self-pay | Admitting: *Deleted

## 2015-11-25 NOTE — Telephone Encounter (Signed)
Patient would like to speak with PCP regarding the side effects she had from the terbinafine. States she only took one dose but had dark urine, problems with her vision, and still does not feel right. Patient declines appointment at this time, would rather just discuss with MD.

## 2015-11-26 ENCOUNTER — Encounter: Payer: Self-pay | Admitting: Family Medicine

## 2015-11-26 MED ORDER — EFINACONAZOLE 10 % EX SOLN: CUTANEOUS | Status: DC

## 2015-11-26 NOTE — Telephone Encounter (Signed)
Called back and spoke to Erika Osborn about terbinafine.  Reports took one dose of terbinafine Saturday evening and then had darker urine x1, blurry vision, fatigue, insomnia, urinary frequency.  Reports she is now feeling better 70% with normal vision, but does not want to take lamisil. Recommended topical treatment instead with efinaconazole x48wks.  Patient agreeable.  Virginia Crews, MD, MPH PGY-2,  [facility] 11/26/2015 2:48 PM

## 2015-12-13 ENCOUNTER — Encounter: Payer: Self-pay | Admitting: Family Medicine

## 2015-12-13 ENCOUNTER — Ambulatory Visit (INDEPENDENT_AMBULATORY_CARE_PROVIDER_SITE_OTHER): Payer: 59 | Admitting: Family Medicine

## 2015-12-13 VITALS — BP 152/88 | HR 79 | Temp 98.1°F | Ht 60.0 in | Wt 146.4 lb

## 2015-12-13 DIAGNOSIS — B070 Plantar wart: Secondary | ICD-10-CM | POA: Diagnosis not present

## 2015-12-13 NOTE — Patient Instructions (Signed)
Plantar Warts Warts are small growths on the skin. They can occur on various areas of the body. When they occur on the underside (sole) of the foot, they are called plantar warts. Plantar warts often occur in groups, with several small warts around a larger growth. They tend to develop over areas of pressure, such as the heel or the ball of the foot. Most warts are not painful, and they usually do not cause problems. However, plantar warts may cause pain when you walk because pressure is applied to them. Warts often go away on their own in time. Various treatments may be done if needed. Sometimes, warts go away and then they come back again. CAUSES Plantar warts are caused by a type of virus that is called human papillomavirus (HPV). HPV attacks a break in the skin of the foot. Walking barefoot can lead to exposure to the virus. These warts may spread to other areas of the sole. They spread to other areas of the body only through direct contact. RISK FACTORS Plantar warts are more likely to develop in:  People who are 75-76 years of age.  People who use public showers or locker rooms.  People who have a weakened body defense system (immune system). SYMPTOMS Plantar warts may be flat or slightly raised. They may grow into the deeper layers of skin or rise above the surface of the skin. Most plantar warts have a rough surface. They may cause pain when you use your foot to support your body weight. DIAGNOSIS A plantar wart can usually be diagnosed from its appearance. In some cases, a tissue sample may be removed (biopsy) to be looked at under a microscope. TREATMENT In many cases, warts do not need treatment. Without treatment, they often go away over a period of many months to a couple years. If treatment is needed, options may include:  Applying medicated solutions, creams, or patches to the wart. These may be over-the-counter or prescription medicines that make the skin soft so that layers will  gradually shed away. In many cases, the medicine is applied one or two times per day and covered with a bandage.  Putting duct tape over the top of the wart (occlusion). You will leave the tape in place for as long as told by your health care provider, then you will replace it with a new strip of tape. This is done until the wart goes away.  Freezing the wart with liquid nitrogen (cryotherapy).  Burning the wart with:  Laser treatment.  An electrified probe (electrocautery).  Injection of a medicine (Candida antigen) into the wart to help the body's immune system to fight off the wart.  Surgery to remove the wart. HOME CARE INSTRUCTIONS  Apply medicated creams or solutions only as told by your health care provider. This may involve:  Soaking the affected area in warm water.  Removing the top layer of softened skin before you apply the medicine. A pumice stone works well for removing the tissue.  Applying a bandage over the affected area after you apply the medicine.  Repeating the process daily or as told by your health care provider.  Do not scratch or pick at a wart.  Wash your hands after you touch a wart.  If a wart is painful, try applying a bandage with a hole in the middle over the wart. The helps to take pressure off the wart.  Keep all follow-up visits as told by your health care provider. This is important. PREVENTION  Take these actions to help prevent warts:  Wear shoes and socks. Change your socks daily.  Keep your feet clean and dry.  Check your feet regularly.  Avoid direct contact with warts on other people. SEEK MEDICAL CARE IF:  Your warts do not improve after treatment.  You have redness, swelling, or pain at the site of a wart.  You have bleeding from a wart that does not stop with light pressure.  You have diabetes and you develop a wart.   This information is not intended to replace advice given to you by your health care provider. Make sure  you discuss any questions you have with your health care provider.   Document Released: 08/29/2003 Document Revised: 02/27/2015 Document Reviewed: 09/03/2014 Elsevier Interactive Patient Education [employer].

## 2015-12-13 NOTE — Progress Notes (Signed)
Subjective:    Erika Osborn is a 65 y.o. female who complains of warts. The warts are located on plantar surface of L foot. They have been present for 1 year. The patient denies pain or cellulitic infection symptoms.  The following portions of the patient's history were reviewed and updated as appropriate: allergies, current medications, past family history, past medical history, past social history, past surgical history and problem list.  Review of Systems Pertinent items are noted in HPI.    Objective:    Skin: 4 warts noted on plantar surface of L foot. Size range is 0.5-1.5 cm.    Assessment:    Plantar Warts     Plan:    1. The viral etiology and natural history has been discussed.  2. Various treatment methods, side effects and failure rates have been discussed.   Consent obtained 3. A choice of liquid nitrogen was made, and the expected blistering or scabbing reaction explained. 4. Liquid nitrogen was applied to 4 warts for 5-6 second freeze/thaw/refreeze cycles. Larger 2 warts scraped with 15 blade. 5. The patient will return at 2 month intervals for retreatment's as needed.    Virginia Crews, MD, MPH PGY-2,  [facility] 12/13/2015 10:02 AM

## 2016-01-24 ENCOUNTER — Encounter: Payer: Self-pay | Admitting: Internal Medicine

## 2016-01-24 ENCOUNTER — Ambulatory Visit (INDEPENDENT_AMBULATORY_CARE_PROVIDER_SITE_OTHER): Payer: 59 | Admitting: Internal Medicine

## 2016-01-24 DIAGNOSIS — L989 Disorder of the skin and subcutaneous tissue, unspecified: Secondary | ICD-10-CM | POA: Diagnosis not present

## 2016-01-24 DIAGNOSIS — B070 Plantar wart: Secondary | ICD-10-CM

## 2016-01-24 NOTE — Patient Instructions (Addendum)
It was nice meeting you today Erika Osborn!  We will schedule an appointment for you to be seen at our dermatology clinic. There they can remove the spot on your leg so it won't bother you anymore.   Please return at your earliest convenience to have your blood sugar drawn for your insurance form. Make sure you do not eat before coming in, and bring the form with you so they can fill it out. It will be helpful to call in advance to let them know you will be coming.   We will see you back in January for your next annual check up, or sooner if needed.   If you have any questions or concerns, please feel free to call the clinic.   Be well,  Dr. Avon Gully

## 2016-01-24 NOTE — Progress Notes (Signed)
   Subjective:    Patient ID: Erika Osborn, female    DOB: 09/10/1950, 65 y.o.   MRN: [id number]  HPI  Patient presents for f/u after wart treatment and for lesion on L thigh.   S/p plantar wart treatment Patient reports at last appointment two months ago, she was instructed to begin using some sort of acid on plantar warts of her L foot. She was seen by Dr. Brita Osborn but does not remember the name of the acid. She used it as prescribed every night for one month, but then self-discontinued due to burning, redness, and peeling at the application sites. The warts have since resolved, and the patient no longer experiences the pain she felt when the warts were present. She is very pleased with the results. She does report dry skin on her L foot, but is using lotion for this.   Lesion on L thigh Patient reports presence of a lesion on her L thigh for about a year. Describes the lesion as hard and rough. It catches on her clothes and is painful. Patient has accidentally shaved the top of the lesion off when shaving her legs, but it has quickly grown back. Not increasing in size. No drainage or bleeding.   Review of Systems See HPI.     Objective:   Physical Exam  Constitutional: She is oriented to person, place, and time. She appears well-developed and well-nourished. No distress.  HENT:  Head: Normocephalic and atraumatic.  Pulmonary/Chest: Effort normal.  Neurological: She is alert and oriented to person, place, and time.  Skin: She is not diaphoretic.  Small white/flesh-colored lesion on L lateral thigh. Raised and crusted in appearance. Non-tender to palpation. No surrounding erythema or induration. Dry, cracking skin of plantar surface of both feet. No warts or other lesions present. No TTP of plantar surface of L foot.   Psychiatric: She has a normal mood and affect. Her behavior is normal.     Assessment & Plan:  Plantar wart of left foot S/p at home treatment. Now resolved.   Skin  lesion of left leg Appears most like seborrheic keratosis given raised, scaly appearance. As it is painful and irritating to patient, removal is indicated.  - Patient to schedule appt at derm clinic for removal  Erika Hector, MD, MPH PGY-2 [facility] Pager [phone]

## 2016-01-24 NOTE — Assessment & Plan Note (Signed)
S/p at home treatment. Now resolved.

## 2016-01-24 NOTE — Assessment & Plan Note (Signed)
Appears most like seborrheic keratosis given raised, scaly appearance. As it is painful and irritating to patient, removal is indicated.  - Patient to schedule appt at derm clinic for removal

## 2016-02-07 ENCOUNTER — Other Ambulatory Visit (INDEPENDENT_AMBULATORY_CARE_PROVIDER_SITE_OTHER): Payer: 59

## 2016-02-07 LAB — GLUCOSE, CAPILLARY: Glucose-Capillary: 98 mg/dL (ref 65–99)

## 2016-02-19 ENCOUNTER — Ambulatory Visit: Payer: 59

## 2016-03-06 ENCOUNTER — Other Ambulatory Visit: Payer: Self-pay | Admitting: Family Medicine

## 2016-03-06 NOTE — Telephone Encounter (Signed)
Needs refill on tramadol. Please notify when ready to pickup

## 2016-03-11 MED ORDER — TRAMADOL HCL 50 MG PO TABS: 100.0000 mg | ORAL_TABLET | Freq: Four times a day (QID) | ORAL | 2 refills | Status: DC | PRN

## 2016-03-11 NOTE — Telephone Encounter (Signed)
Please let patient know that Rx is available to pick up at front desk today.  Thanks!!  Virginia Crews, MD, MPH PGY-3,  [facility] 03/11/2016 9:13 AM

## 2016-03-11 NOTE — Telephone Encounter (Signed)
Pt informed. Lavette Yankovich, CMA  

## 2016-03-18 ENCOUNTER — Other Ambulatory Visit (HOSPITAL_COMMUNITY)
Admission: RE | Admit: 2016-03-18 | Discharge: 2016-03-18 | Disposition: A | Discharge status: Home / Self Care | Payer: 59 | Source: Ambulatory Visit | Attending: Family Medicine | Admitting: Family Medicine

## 2016-03-18 ENCOUNTER — Ambulatory Visit (INDEPENDENT_AMBULATORY_CARE_PROVIDER_SITE_OTHER): Payer: 59 | Admitting: Family Medicine

## 2016-03-18 DIAGNOSIS — D489 Neoplasm of uncertain behavior, unspecified: Secondary | ICD-10-CM | POA: Diagnosis not present

## 2016-03-18 DIAGNOSIS — L570 Actinic keratosis: Secondary | ICD-10-CM | POA: Diagnosis not present

## 2016-03-18 DIAGNOSIS — L989 Disorder of the skin and subcutaneous tissue, unspecified: Secondary | ICD-10-CM | POA: Diagnosis present

## 2016-03-18 NOTE — Progress Notes (Signed)
    Subjective:  Erika Osborn is a 65 y.o. female who presents to the [facility] today for evaluation for a skin lesion on her left thigh.   HPI: Patient presents for evaluation of a skin lesion on her left anterior thigh.  It has been there for about one year at this point and she accidentally shaved it off one month ago.  She states that it is painful and it rubs against her clothing and when she is sitting under a desk.  Prior to shaving it off, it was longer and had a horned appearance.  It has not changed in size in diameter since it's appearance.  It is not itchy, warm to the touch and she denies any weeping from the lesion.    PMH: ulcerative colitis, HTN ROS: see HPI  Objective:  Physical Exam: BP (!) 138/52   Pulse 83   Temp 98.3 F (36.8 C) (Oral)   Wt 143 lb 12.8 oz (65.2 kg)   BMI 28.08 kg/m   Gen: Lagi.Manning F with NAD, resting comfortably CV: RRR with no murmurs appreciated Pulm: NWOB, CTAB with no crackles, wheezes, or rhonchi GI: Normal bowel sounds present. Soft, Nontender, Nondistended. MSK: no edema, cyanosis, or clubbing noted Skin: 57m crusty macule with 317msurrounding erythema without fluctuance. Tender to palpation.   Neuro: grossly normal, moves all extremities Psych: Normal affect and thought content  No results found for this or any previous visit (from the past 72 hour(s)).   Shave biopsy: Patient was advised of risks and benefits and consented to the procedure.  Consent form has been placed in patient chart.  Site was cleaned with alcohol and anesthetized with 0.5cc of 1% lidocaine with epinephrine.  Site was marked and sterilized with betadine.  Shave was completed without complications and minimal bleeding.  Bandage applied.       Assessment/Plan:  Neoplasm of uncertain behavior Patient had lesion on anterior right thigh removed with shave biopsy and was sent to pathology for evaluation.  She consented for the procedure and it was well tolerated and performed  without complications.   - follow up pathology results with patient

## 2016-03-18 NOTE — Assessment & Plan Note (Signed)
Patient had lesion on anterior right thigh removed with shave biopsy and was sent to pathology for evaluation.  She consented for the procedure and it was well tolerated and performed without complications.   - follow up pathology results with patient

## 2016-03-18 NOTE — Progress Notes (Signed)
lesion

## 2016-03-18 NOTE — Patient Instructions (Signed)
Erika Osborn, you were seen today for an evaluation of a small skin lesion on your left thigh that has been bothering you.    Today we did a shave biopsy to remove this for you.  We have low suspicion that this is a cancerous lesion, however we will send it out to the pathology lab.    Great to see you today.  Take care, [name] L. Rosalyn Gess, [place] [facility] Resident PGY-1 03/18/2016 2:13 PM

## 2016-03-20 ENCOUNTER — Telehealth: Payer: Self-pay | Admitting: Family Medicine

## 2016-03-20 NOTE — Telephone Encounter (Signed)
Spoke with Erika Osborn this morning and discussed pathology report with her.  She has no questions.

## 2016-04-16 ENCOUNTER — Ambulatory Visit (INDEPENDENT_AMBULATORY_CARE_PROVIDER_SITE_OTHER): Payer: 59 | Admitting: *Deleted

## 2016-04-16 DIAGNOSIS — Z23 Encounter for immunization: Secondary | ICD-10-CM

## 2016-05-13 ENCOUNTER — Other Ambulatory Visit: Payer: Self-pay | Admitting: Family Medicine

## 2016-05-13 DIAGNOSIS — Z1231 Encounter for screening mammogram for malignant neoplasm of breast: Secondary | ICD-10-CM

## 2016-05-22 ENCOUNTER — Other Ambulatory Visit: Payer: Self-pay | Admitting: Family Medicine

## 2016-05-25 ENCOUNTER — Ambulatory Visit (INDEPENDENT_AMBULATORY_CARE_PROVIDER_SITE_OTHER): Payer: 59 | Admitting: Internal Medicine

## 2016-05-25 VITALS — BP 111/65 | HR 84 | Temp 98.3°F | Ht 60.0 in | Wt 145.2 lb

## 2016-05-25 DIAGNOSIS — J069 Acute upper respiratory infection, unspecified: Secondary | ICD-10-CM | POA: Diagnosis not present

## 2016-05-25 DIAGNOSIS — B9789 Other viral agents as the cause of diseases classified elsewhere: Secondary | ICD-10-CM | POA: Diagnosis not present

## 2016-05-25 DIAGNOSIS — R0981 Nasal congestion: Secondary | ICD-10-CM | POA: Diagnosis not present

## 2016-05-25 MED ORDER — FLUTICASONE PROPIONATE 50 MCG/ACT NA SUSP: 2.0000 | Freq: Every day | NASAL | 6 refills | Status: DC

## 2016-05-25 NOTE — Patient Instructions (Signed)
Restart Flonase and try tea with honey for your cough. This is likely a viral URI which will resolve in the next few days. Your cough may linger for 1-2 weeks. Please return to clinic if you have difficulty breathing or high fevers.    Upper Respiratory Infection, Adult Most upper respiratory infections (URIs) are a viral infection of the air passages leading to the lungs. A URI affects the nose, throat, and upper air passages. The most common type of URI is nasopharyngitis and is typically referred to as "the common cold." URIs run their course and usually go away on their own. Most of the time, a URI does not require medical attention, but sometimes a bacterial infection in the upper airways can follow a viral infection. This is called a secondary infection. Sinus and middle ear infections are common types of secondary upper respiratory infections. Bacterial pneumonia can also complicate a URI. A URI can worsen asthma and chronic obstructive pulmonary disease (COPD). Sometimes, these complications can require emergency medical care and may be life threatening. What are the causes? Almost all URIs are caused by viruses. A virus is a type of germ and can spread from one person to another. What increases the risk? You may be at risk for a URI if:  You smoke.  You have chronic heart or lung disease.  You have a weakened defense (immune) system.  You are very young or very old.  You have nasal allergies or asthma.  You work in crowded or poorly ventilated areas.  You work in health care facilities or schools. What are the signs or symptoms? Symptoms typically develop 2-3 days after you come in contact with a cold virus. Most viral URIs last 7-10 days. However, viral URIs from the influenza virus (flu virus) can last 14-18 days and are typically more severe. Symptoms may include:  Runny or stuffy (congested) nose.  Sneezing.  Cough.  Sore  throat.  Headache.  Fatigue.  Fever.  Loss of appetite.  Pain in your forehead, behind your eyes, and over your cheekbones (sinus pain).  Muscle aches. How is this diagnosed? Your health care provider may diagnose a URI by:  Physical exam.  Tests to check that your symptoms are not due to another condition such as:  Strep throat.  Sinusitis.  Pneumonia.  Asthma. How is this treated? A URI goes away on its own with time. It cannot be cured with medicines, but medicines may be prescribed or recommended to relieve symptoms. Medicines may help:  Reduce your fever.  Reduce your cough.  Relieve nasal congestion. Follow these instructions at home:  Take medicines only as directed by your health care provider.  Gargle warm saltwater or take cough drops to comfort your throat as directed by your health care provider.  Use a warm mist humidifier or inhale steam from a shower to increase air moisture. This may make it easier to breathe.  Drink enough fluid to keep your urine clear or pale yellow.  Eat soups and other clear broths and maintain good nutrition.  Rest as needed.  Return to work when your temperature has returned to normal or as your health care provider advises. You may need to stay home longer to avoid infecting others. You can also use a face mask and careful hand washing to prevent spread of the virus.  Increase the usage of your inhaler if you have asthma.  Do not use any tobacco products, including cigarettes, chewing tobacco, or electronic cigarettes. If  you need help quitting, ask your health care provider. How is this prevented? The best way to protect yourself from getting a cold is to practice good hygiene.  Avoid oral or hand contact with people with cold symptoms.  Wash your hands often if contact occurs. There is no clear evidence that vitamin C, vitamin E, echinacea, or exercise reduces the chance of developing a cold. However, it is always  recommended to get plenty of rest, exercise, and practice good nutrition. Contact a health care provider if:  You are getting worse rather than better.  Your symptoms are not controlled by medicine.  You have chills.  You have worsening shortness of breath.  You have brown or red mucus.  You have yellow or brown nasal discharge.  You have pain in your face, especially when you bend forward.  You have a fever.  You have swollen neck glands.  You have pain while swallowing.  You have white areas in the back of your throat. Get help right away if:  You have severe or persistent:  Headache.  Ear pain.  Sinus pain.  Chest pain.  You have chronic lung disease and any of the following:  Wheezing.  Prolonged cough.  Coughing up blood.  A change in your usual mucus.  You have a stiff neck.  You have changes in your:  Vision.  Hearing.  Thinking.  Mood. This information is not intended to replace advice given to you by your health care provider. Make sure you discuss any questions you have with your health care provider. Document Released: 12/02/2000 Document Revised: 02/09/2016 Document Reviewed: 09/13/2013 Elsevier Interactive Patient Education  2017 [employer].

## 2016-05-25 NOTE — Progress Notes (Signed)
   [facility] Phone: [phone]   Date of Visit: 05/25/2016   HPI:  Erika Osborn is a 65 y.o. female presenting to clinic today for same day appointment. PCP: Lavon Paganini, MD Concerns today include: - sore throat, nasal congestion and rhinorrhea, decreased appetite, productive cough (yellow green), postnasal drip. These symptoms started Friday. Also noted to have subjective fevers since Friday. Reports of difficulty breathing due to nasal congestion but otherwise denies increased work of breathing. Notes of little sinus pain. Symptoms have been persistent but not worsening. Her cough is mostly at night time. Denies itchy/water eyes. Does have history of allergic rhinitis and takes Zyrtec daily. Ran out of Flonase months ago.  - no nausea/vomiting/diarrhea - daughter sick with same symptoms  ROS: See HPI.  [place]:  HTN Hypothyroidism  Ulcerative Colitis   PHYSICAL EXAM: BP 111/65 (BP Location: Left Arm, Patient Position: Sitting, Cuff Size: Normal)   Pulse 92   Temp 98.3 F (36.8 C) (Oral)   Ht 5' (1.524 m)   Wt 145 lb 3.2 oz (65.9 kg)   SpO2 97%   BMI 28.36 kg/m  GEN: NAD HEENT: Atraumatic, normocephalic, neck supple without cervical lymphadenopathy, EOMI, sclera clear, oropharynx mildly erythematous but without exudates. No sinus tenderness. TMs normal bilaterally  CV: RRR, no murmurs, rubs, or gallops PULM: CTAB, normal effort SKIN: No rash or cyanosis; warm and well-perfused PSYCH: Mood and affect euthymic, normal rate and volume of speech NEURO: Awake, alert, no focal deficits grossly, normal speech   ASSESSMENT/PLAN:  1. Viral URI with cough Symptoms consistent with Viral URI. No signs of pneumonia - symptomatic treatment discussed - restart flonase for postnasal drip contributing to cough - follow up if symptoms worsen or do not resolve in the next week. Discussed cough can linger for 1-2 weeks.   2. Congestion of nasal sinus -  fluticasone (FLONASE) 50 MCG/ACT nasal spray; Place 2 sprays into both nostrils daily.  Dispense: 16 g; Refill: 6   Smiley Houseman, MD PGY [place]

## 2016-07-01 ENCOUNTER — Ambulatory Visit
Admission: RE | Admit: 2016-07-01 | Discharge: 2016-07-01 | Disposition: A | Discharge status: Home / Self Care | Payer: 59 | Source: Ambulatory Visit | Attending: Family Medicine | Admitting: Family Medicine

## 2016-07-01 DIAGNOSIS — Z1231 Encounter for screening mammogram for malignant neoplasm of breast: Secondary | ICD-10-CM

## 2016-07-02 ENCOUNTER — Ambulatory Visit: Payer: 59

## 2016-07-02 ENCOUNTER — Encounter: Payer: Self-pay | Admitting: Family Medicine

## 2016-07-03 ENCOUNTER — Other Ambulatory Visit: Payer: Self-pay | Admitting: Family Medicine

## 2016-07-03 NOTE — Telephone Encounter (Signed)
Needs tramadol. Please call when ready for pickup

## 2016-07-06 MED ORDER — TRAMADOL HCL 50 MG PO TABS: 100.0000 mg | ORAL_TABLET | Freq: Four times a day (QID) | ORAL | 2 refills | Status: DC | PRN

## 2016-07-06 NOTE — Telephone Encounter (Signed)
Pt informed. Deseree Blount, CMA  

## 2016-07-06 NOTE — Telephone Encounter (Signed)
Rx available for pick up at front desk.  Patient will need f/u appt before next refill.  Please let patient know. Thanks!  Virginia Crews, MD, MPH PGY-3,  [facility] 07/06/2016 10:20 AM

## 2016-08-06 ENCOUNTER — Ambulatory Visit (INDEPENDENT_AMBULATORY_CARE_PROVIDER_SITE_OTHER): Payer: 59 | Admitting: Family Medicine

## 2016-08-06 ENCOUNTER — Encounter: Payer: Self-pay | Admitting: Family Medicine

## 2016-08-06 VITALS — BP 132/90 | HR 75 | Temp 98.1°F | Ht 60.0 in | Wt 144.6 lb

## 2016-08-06 DIAGNOSIS — M797 Fibromyalgia: Secondary | ICD-10-CM

## 2016-08-06 DIAGNOSIS — K51919 Ulcerative colitis, unspecified with unspecified complications: Secondary | ICD-10-CM | POA: Diagnosis not present

## 2016-08-06 DIAGNOSIS — E663 Overweight: Secondary | ICD-10-CM | POA: Diagnosis not present

## 2016-08-06 DIAGNOSIS — Z13820 Encounter for screening for osteoporosis: Secondary | ICD-10-CM

## 2016-08-06 NOTE — Progress Notes (Signed)
Pre visit review using our clinic review tool, if applicable. No additional management support is needed unless otherwise documented below in the visit note. 

## 2016-08-08 ENCOUNTER — Other Ambulatory Visit: Payer: Self-pay | Admitting: Family Medicine

## 2016-08-08 ENCOUNTER — Encounter: Payer: Self-pay | Admitting: Family Medicine

## 2016-08-08 NOTE — Progress Notes (Signed)
Erika Osborn is a 66 y.o. female is here to [place].   History of Present Illness:    1. Fibromyalgia. Pain controlled with Tramadol TID. Patient generally schedules the medication to stay ahead of the pain. Does not take time to exercise. Watches diet due to Ulcerative Colitis. No time for herself. 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 Erika Osborn is helpful and supportive, but works second shift.    2. Screening for osteoporosis. Due.   3. Overweight (BMI 25.0-29.9). As above.   4. Ulcerative colitis with complication, unspecified location [facility]). Stable.     Health Maintenance Due  Topic Date Due  . Hepatitis C Screening  1951/06/10  . COLONOSCOPY  12/15/1999  . DEXA SCAN  12/15/2014    PMHx, SurgHx, SocialHx, Medications, and Allergies were reviewed in the Visit Navigator and updated as appropriate.    Past Medical History:  Diagnosis Date  . Arthritis   . Fibromyalgia   . GERD (gastroesophageal reflux disease)   . HLD (hyperlipidemia)   . HTN (hypertension)   . Hypothyroidism following radioiodine therapy   . Ulcerative colitis ([place]) 1976    Past Surgical History:  Procedure Laterality Date  . BREAST REDUCTION SURGERY    . LUMBAR DISC SURGERY      Family History  Problem Relation Age of Onset  . Cancer Father   . Heart failure Mother   . Hyperlipidemia Mother   . Hyperlipidemia Brother   . Hyperlipidemia Sister   . Osteoporosis Mother     Social History  Substance Use Topics  . Smoking status: Never Smoker  . Smokeless tobacco: Never Used  . Alcohol use No     Current Medications and Allergies:    Current Outpatient Prescriptions:  .  cetirizine (ZYRTEC) 10 MG tablet, Take 10 mg by mouth daily., Disp: , Rfl:  .  fluticasone (FLONASE) 50 MCG/ACT nasal spray, Place 2 sprays into both nostrils daily., Disp: 16 g, Rfl: 6 .  levothyroxine (SYNTHROID, LEVOTHROID) 100 MCG tablet, Take 1 tablet (100 mcg total) by  mouth daily., Disp: 90 tablet, Rfl: 3 .  mesalamine (LIALDA) 1.2 g EC tablet, Take 4.8 g by mouth daily with breakfast., Disp: , Rfl:  .  Multiple Vitamins-Minerals (MULTIVITAMIN & MINERAL PO), Take by mouth., Disp: , Rfl:  .  quinapril-hydrochlorothiazide (ACCURETIC) 20-12.5 MG tablet, TAKE 1 TABLET BY MOUTH DAILY, Disp: 90 tablet, Rfl: 2 .  ranitidine (ZANTAC) 150 MG capsule, Take 150 mg by mouth daily., Disp: , Rfl:  .  traMADol (ULTRAM) 50 MG tablet, Take 2 tablets (100 mg total) by mouth every 6 (six) hours as needed., Disp: 240 tablet, Rfl: 2   Allergies  Allergen Reactions  . Humira [Adalimumab] Other (See Comments)  . Penicillins   . Remicade [Infliximab]   . Erythromycin Rash      Patient Information Form: Screening and ROS    Do you feel safe in relationships? yes PHQ-2: not done  Review of Systems  General:  Negative for unexplained weight loss, fever Skin: Negative for new or changing mole, sore that won't heal HEENT: Negative for trouble hearing, trouble seeing, ringing in ears, mouth sores, hoarseness, change in voice, dysphagia CV:  Negative for chest pain, dyspnea, edema, palpitations Resp: Negative for cough, dyspnea, hemoptysis GI: Negative for nausea, vomiting, diarrhea, constipation, abdominal pain, melena, hematochezia GU: Negative for dysuria, incontinence, urinary hesitance, hematuria, vaginal or penile discharge, polyuria, sexual difficulty, lumps in testicle or breasts MSK: Negative  for muscle cramps or aches, joint pain or swelling Neuro: Negative for headaches, weakness, numbness, dizziness, passing out/fainting Psych: Negative for depression, anxiety, memory problems   Vitals:   Vitals:   08/06/16 1005  BP: 132/90  Pulse: 75  Temp: 98.1 F (36.7 C)  TempSrc: Oral  SpO2: 97%  Weight: 144 lb 9.6 oz (65.6 kg)  Height: 5' (1.524 m)     Body mass index is 28.24 kg/m.   Physical Exam:     General: Alert, cooperative, appears stated age  and no distress.  HEENT:  Normocephalic, without obvious abnormality, atraumatic. Conjunctivae/corneas clear. PERRL, EOM's intact. Normal TM's and external ear canals both ears. Nares normal. Septum midline. Mucosa normal. No drainage or sinus tenderness. Lips, mucosa, and tongue normal; teeth and gums normal.  Lungs: Clear to auscultation bilaterally.  Heart:: Regular rate and rhythm, S1, S2 normal, no murmur, click, rub or gallop.  Abdomen: Soft, non-tender; bowel sounds normal; no masses,  no organomegaly.  Extremities: Extremities normal, atraumatic, no cyanosis or edema.  Pulses: 2+ and symmetric.  Skin: Skin color, texture, turgor normal. No rashes or lesions.  Neurologic: Alert and oriented X 3, normal strength and tone. Normal symmetric. reflexes. Normal coordination and gait.  Psych: Alert,oriented, in NAD with a full range of affect, normal behavior and no psychotic features     Assessment and Plan:    [name] was seen today for pain.  Diagnoses and all orders for this visit:  Fibromyalgia Comments: Will take over Tramadol Rx. Okay TID, 90 day refill when she calls. Controlled substance contract signed.   Screening for osteoporosis -     DG Bone Density  Overweight (BMI 25.0-29.9) Comments: Diet, exercise, stress reduction reviewed.  Ulcerative colitis with complication, unspecified location [facility]) Comments: Stable.    . Reviewed expectations re: course of current medical issues. . Discussed self-management of symptoms. . Outlined signs and symptoms indicating need for more acute intervention. . Patient verbalized understanding and all questions were answered. . See orders for this visit as documented in the electronic medical record. . Patient received an After Visit Summary.  Records requested if needed. I spent 30 minutes with this patient, greater than 50% was face-to-face time counseling regarding the above diagnoses.    Briscoe Deutscher, [place], [place] 08/08/2016   Follow-up: No Follow-up on file.  No orders of the defined types were placed in this encounter.  Medications Discontinued During This Encounter  Medication Reason  . carbamide peroxide (DEBROX) 6.5 % otic solution Error   Orders Placed This Encounter  Procedures  . DG Bone Density

## 2016-08-14 ENCOUNTER — Encounter: Payer: Self-pay | Admitting: Family Medicine

## 2016-08-14 NOTE — Telephone Encounter (Signed)
Thought pt was still est at [facility]. Erika Osborn

## 2016-08-21 ENCOUNTER — Other Ambulatory Visit: Payer: Self-pay | Admitting: Family Medicine

## 2016-09-01 ENCOUNTER — Telehealth: Payer: Self-pay | Admitting: Family Medicine

## 2016-09-01 ENCOUNTER — Other Ambulatory Visit: Payer: Self-pay

## 2016-09-01 MED ORDER — TRAMADOL HCL 50 MG PO TABS: 100.0000 mg | ORAL_TABLET | Freq: Four times a day (QID) | ORAL | 0 refills | Status: DC | PRN

## 2016-09-01 NOTE — Telephone Encounter (Signed)
Patient requesting refill on rx traMADol (ULTRAM) 50 MG tablet.  [facility] [place], [address] AT [address] [phone] (Phone) [phone] (Fax)   Please call patient with any questions. Patient needs the refill as soon as possible.

## 2016-09-01 NOTE — Telephone Encounter (Signed)
Please advise on refill.

## 2016-09-01 NOTE — Telephone Encounter (Signed)
Patient states that she normally gets 240 tablets. -Please call patient if the medication needs to go somewhere else.

## 2016-09-01 NOTE — Telephone Encounter (Signed)
Okay to refill per her usual.

## 2016-09-01 NOTE — Telephone Encounter (Signed)
Refill faxed to patient's pharmacy.  240 tablets, 0 refills.

## 2016-10-01 ENCOUNTER — Ambulatory Visit (INDEPENDENT_AMBULATORY_CARE_PROVIDER_SITE_OTHER): Payer: 59 | Admitting: Family Medicine

## 2016-10-01 VITALS — BP 136/88 | HR 84 | Temp 98.5°F | Ht 60.0 in | Wt 143.4 lb

## 2016-10-01 DIAGNOSIS — G8929 Other chronic pain: Secondary | ICD-10-CM

## 2016-10-01 DIAGNOSIS — M545 Low back pain, unspecified: Secondary | ICD-10-CM

## 2016-10-01 DIAGNOSIS — M797 Fibromyalgia: Secondary | ICD-10-CM | POA: Diagnosis not present

## 2016-10-01 DIAGNOSIS — M25562 Pain in left knee: Secondary | ICD-10-CM

## 2016-10-01 DIAGNOSIS — M25561 Pain in right knee: Secondary | ICD-10-CM

## 2016-10-01 MED ORDER — TRAMADOL HCL 50 MG PO TABS: ORAL_TABLET | ORAL | 0 refills | Status: DC

## 2016-10-01 NOTE — Progress Notes (Signed)
Pre visit review using our clinic review tool, if applicable. No additional management support is needed unless otherwise documented below in the visit note. 

## 2016-10-01 NOTE — Progress Notes (Signed)
Erika Osborn is a 66 y.o. female is here to discuss:  History of Present Illness:  [name] CMA acting as scribe for Dr. [name].  Chief Complaint  Patient presents with  . Follow-up    not sleeping due to pain   HPI:  1. Fibromyalgia. Patient has a several year history of this diagnosis. Manifestations: soft tissue pain, anxiety, and fatigue. They are made worse by: cold exposure and overuse. They are helped by Tramadol (Hx of UC). Diagnosis is of longstanding, no ROS questions asked. Previous treatments include OTC meds, prescription meds - see below and chiropractic therapy. Associated symptoms include depression, fatigue, joint pain and morning stiffness. She is the primary caregiver for her 75 year old daughter with CP.   2. Chronic pain of both knees. Onset of the symptoms was several years ago. Inciting event: none known. Current symptoms include crepitus sensation, locking, popping sensation and stiffness. Pain is aggravated by inactivity and rising after sitting. Patient has had prior knee problems. Evaluation to date: none. Treatment to date: none.    3. Chronic midline low back pain without sciatica. The patient first noted symptoms several years ago. It was not related to no known injury and an injury. The pain is rated moderate, and is located at the across the lower back. The pain is described as aching and occurs intermittently. Symptoms are exacerbated by extension and flexion. Factors which relieve the pain include narcotic pain medications and rest. Other associated symptoms include no other symptoms.    Health Maintenance Due  Topic Date Due  . Hepatitis C Screening  1951/05/22  . COLONOSCOPY  12/15/1999  . DEXA SCAN  12/15/2014   PMHx, SurgHx, SocialHx, FamHx, Medications, and Allergies were reviewed in the Visit Navigator and updated as appropriate.   Patient Active Problem List   Diagnosis Date Noted  . Onychomycosis 11/22/2015  . Overweight (BMI 25.0-29.9)  06/25/2015  . 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 Allergic conjunctivitis and rhinitis 01/17/2014  . 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 Fibromyalgia 07/19/2013  . HTN (hypertension) 07/19/2013  . HLD (hyperlipidemia) 07/19/2013  . Ulcerative colitis ([place]) 07/19/2013   Social History  Substance Use Topics  . Smoking status: Never Smoker  . Smokeless tobacco: Never Used  . Alcohol use No   Current Medications and Allergies:   .  cetirizine (ZYRTEC) 10 MG tablet, Take 10 mg by mouth daily., Disp: , Rfl:  .  fluticasone (FLONASE) 50 MCG/ACT nasal spray, Place 2 sprays into both nostrils daily., Disp: 16 g, Rfl: 6 .  levothyroxine (SYNTHROID, LEVOTHROID) 100 MCG tablet, TAKE 1 TABLET BY MOUTH DAILY, Disp: 90 tablet, Rfl: 3 .  mesalamine (LIALDA) 1.2 g EC tablet, Take 4.8 g by mouth daily with breakfast., Disp: , Rfl:  .  Multiple Vitamins-Minerals (MULTIVITAMIN & MINERAL PO), Take by mouth., Disp: , Rfl:  .  quinapril-hydrochlorothiazide (ACCURETIC) 20-12.5 MG tablet, TAKE 1 TABLET BY MOUTH DAILY, Disp: 90 tablet, Rfl: 2 .  ranitidine (ZANTAC) 150 MG capsule, Take 150 mg by mouth daily., Disp: , Rfl:  .  traMADol (ULTRAM) 50 MG tablet, Take 2 tablets (100 mg total) by mouth every 6 (six) hours as needed., Disp: 240 tablet, Rfl: 0  Allergies  Allergen Reactions  . Humira [Adalimumab] Other (See Comments)  . Penicillins   . Remicade [Infliximab]   . Erythromycin Rash   Review of Systems   Review of Systems  Constitutional: Positive for malaise/fatigue. Negative for chills and fever.  HENT: Positive  for congestion. Negative for ear pain, sinus pain and sore throat.   Eyes: Negative for blurred vision and double vision.  Respiratory: Negative for cough, shortness of breath and wheezing.   Cardiovascular: Negative for chest pain, palpitations and leg swelling.  Gastrointestinal: Negative for abdominal pain, constipation, diarrhea and vomiting.  Genitourinary: Negative for  dysuria.  Musculoskeletal: Positive for back pain, joint pain and neck pain.  Skin: Negative for itching and rash.  Neurological: Negative for dizziness and headaches.  Psychiatric/Behavioral: Positive for depression. Negative for hallucinations and memory loss.    Vitals:   Vitals:   10/01/16 1306  BP: 136/88  Pulse: 84  Temp: 98.5 F (36.9 C)  TempSrc: Oral  SpO2: 98%  Weight: 143 lb 6.4 oz (65 kg)  Height: 5' (1.524 m)     Body mass index is 28.01 kg/m.   Physical Exam:    Physical Exam  Constitutional: She appears well-developed and well-nourished. No distress.  HENT:  Head: Normocephalic and atraumatic.  Eyes: EOM are normal. Pupils are equal, round, and reactive to light.  Neck: Normal range of motion. Neck supple.  Cardiovascular: Normal rate, regular rhythm, normal heart sounds and intact distal pulses.   Pulmonary/Chest: Effort normal.  Abdominal: Soft.  Musculoskeletal:       Right knee: She exhibits no swelling and no effusion. Tenderness found. Medial joint line tenderness noted.       Left knee: She exhibits no swelling and no effusion. Tenderness found. Medial joint line tenderness noted.       Lumbar back: She exhibits decreased range of motion. She exhibits no bony tenderness.  Skin: Skin is warm.  Psychiatric: She has a normal mood and affect. Her behavior is normal. Cognition and memory are normal.  Nursing note and vitals reviewed.  Assessment and Plan:    Erika Osborn was seen today for follow-up.  Diagnoses and all orders for this visit:  Fibromyalgia Comments: Okay to continue Tramadol, but we discussed weaning. Other modalities reviewed - exercise, stress reduction, Cymbalta, topical treatments. [employer] fibromyalgia water class card provided. Orders: -     traMADol (ULTRAM) 50 MG tablet; 1-2 TID prn pain  Chronic pain of both knees Comments: OA with signs of advancing disease. I recommend that she see Dr. Paulla Fore for further  management. Orders: -     traMADol (ULTRAM) 50 MG tablet; 1-2 TID prn pain  Chronic midline low back pain without sciatica    . Reviewed expectations re: course of current medical issues. . Discussed self-management of symptoms. . Outlined signs and symptoms indicating need for more acute intervention. . Patient verbalized understanding and all questions were answered. . See orders for this visit as documented in the electronic medical record. . Patient received an After Visit Summary.   CMA served as Education administrator during this visit. History, Physical, and Plan performed by medical provider. Documentation and orders reviewed and attested to. Briscoe Deutscher, D.O.  Briscoe Deutscher, [place], [place] 10/02/2016  Follow-up: No Follow-up on file.

## 2016-10-02 ENCOUNTER — Encounter: Payer: Self-pay | Admitting: Family Medicine

## 2016-10-05 ENCOUNTER — Ambulatory Visit: Payer: 59 | Admitting: Sports Medicine

## 2016-10-21 ENCOUNTER — Ambulatory Visit (INDEPENDENT_AMBULATORY_CARE_PROVIDER_SITE_OTHER): Payer: 59

## 2016-10-21 ENCOUNTER — Encounter: Payer: Self-pay | Admitting: Sports Medicine

## 2016-10-21 ENCOUNTER — Ambulatory Visit (INDEPENDENT_AMBULATORY_CARE_PROVIDER_SITE_OTHER): Payer: 59 | Admitting: Sports Medicine

## 2016-10-21 ENCOUNTER — Ambulatory Visit: Payer: Self-pay

## 2016-10-21 VITALS — BP 130/90 | HR 81 | Ht 60.0 in | Wt 142.4 lb

## 2016-10-21 DIAGNOSIS — M5441 Lumbago with sciatica, right side: Principal | ICD-10-CM

## 2016-10-21 DIAGNOSIS — G8929 Other chronic pain: Secondary | ICD-10-CM

## 2016-10-21 DIAGNOSIS — M5442 Lumbago with sciatica, left side: Secondary | ICD-10-CM | POA: Diagnosis not present

## 2016-10-21 DIAGNOSIS — M2550 Pain in unspecified joint: Secondary | ICD-10-CM | POA: Diagnosis not present

## 2016-10-21 DIAGNOSIS — R5382 Chronic fatigue, unspecified: Secondary | ICD-10-CM | POA: Diagnosis not present

## 2016-10-21 DIAGNOSIS — M170 Bilateral primary osteoarthritis of knee: Secondary | ICD-10-CM

## 2016-10-21 DIAGNOSIS — M4726 Other spondylosis with radiculopathy, lumbar region: Secondary | ICD-10-CM

## 2016-10-21 LAB — COMPREHENSIVE METABOLIC PANEL
ALT: 12 U/L (ref 0–35)
AST: 16 U/L (ref 0–37)
Albumin: 4.5 g/dL (ref 3.5–5.2)
Alkaline Phosphatase: 73 U/L (ref 39–117)
BILIRUBIN TOTAL: 0.5 mg/dL (ref 0.2–1.2)
BUN: 14 mg/dL (ref 6–23)
CO2: 33 meq/L — AB (ref 19–32)
Calcium: 10.1 mg/dL (ref 8.4–10.5)
Chloride: 99 mEq/L (ref 96–112)
Creatinine, Ser: 0.66 mg/dL (ref 0.40–1.20)
GFR: 94.99 mL/min (ref >60.00)
GLUCOSE: 92 mg/dL (ref 70–99)
Potassium: 3.6 mEq/L (ref 3.5–5.1)
Sodium: 137 mEq/L (ref 135–145)
Total Protein: 7.9 g/dL (ref 6.0–8.3)

## 2016-10-21 LAB — URIC ACID: Uric Acid, Serum: 4.5 mg/dL (ref 2.4–7.0)

## 2016-10-21 LAB — CBC WITH DIFFERENTIAL/PLATELET
BASOS ABS: 0 10*3/uL (ref 0.0–0.1)
Basophils Relative: 1 % (ref 0.0–3.0)
EOS ABS: 0.1 10*3/uL (ref 0.0–0.7)
Eosinophils Relative: 2.9 % (ref 0.0–5.0)
HCT: 37 % (ref 36.0–46.0)
Hemoglobin: 12.6 g/dL (ref 12.0–15.0)
LYMPHS ABS: 1.2 10*3/uL (ref 0.7–4.0)
Lymphocytes Relative: 27.8 % (ref 12.0–46.0)
MCHC: 33.9 g/dL (ref 30.0–36.0)
MCV: 99.7 fl (ref 78.0–100.0)
MONO ABS: 0.3 10*3/uL (ref 0.1–1.0)
Monocytes Relative: 6 % (ref 3.0–12.0)
NEUTROS ABS: 2.8 10*3/uL (ref 1.4–7.7)
NEUTROS PCT: 62.3 % (ref 43.0–77.0)
PLATELETS: 379 10*3/uL (ref 150.0–400.0)
RBC: 3.71 Mil/uL — ABNORMAL LOW (ref 3.87–5.11)
RDW: 16.1 % — ABNORMAL HIGH (ref 11.5–15.5)
WBC: 4.5 10*3/uL (ref 4.0–10.5)

## 2016-10-21 LAB — C-REACTIVE PROTEIN: CRP: 0.6 mg/dL (ref 0.5–20.0)

## 2016-10-21 LAB — SEDIMENTATION RATE: Sed Rate: 14 mm/hr (ref 0–30)

## 2016-10-21 LAB — VITAMIN D 25 HYDROXY (VIT D DEFICIENCY, FRACTURES): VITD: 53.01 ng/mL (ref 30.00–100.00)

## 2016-10-21 NOTE — Progress Notes (Signed)
OFFICE VISIT NOTE Erika Osborn. [place], [place] at [facility] [phone]  [name] - 66 y.o. female MRN [id number]  Date of birth: 10-04-1950  Visit Date: 10/21/2016  PCP: Briscoe Deutscher, DO   Referred by: Briscoe Deutscher, DO  [name], cma acting as scribe for Dr. Paulla Fore.  SUBJECTIVE:   Chief Complaint  Patient presents with  . NP: Bilateral Knee and LBP   HPI: As below and per problem based documentation when appropriate.  1. Chronic pain of both knees. Onset of the symptoms was several years ago.  Current symptoms include locking, popping sensation and stiffness. Pain is triggered by going from a sitting to standing position, standing still, and at rest. No prior imaging or surgeries. Denies any specific injuries.   2. Chronic midline low back pain with sciatica. The patient first noted symptoms several years ago. It was not related to no known injury and an injury. The pain is rated moderate, and is located at the across the lower back. The pain is described as aching and occurs intermittently. Pain from lower back alternates radiating down LT and RT legs. Pt had Lumbar disc surgery around 2004-2006.    Review of Systems  Constitutional: Negative.   HENT: Negative.   Eyes: Negative.   Respiratory: Negative.   Cardiovascular: Negative.   Gastrointestinal: Negative.   Genitourinary: Negative.   Musculoskeletal: Positive for back pain, joint pain and myalgias.  Skin: Negative.   Neurological: Negative.   Endo/Heme/Allergies: Negative.   Psychiatric/Behavioral: Negative.     Otherwise per HPI.  HISTORY & PERTINENT PRIOR DATA:  No specialty comments available. She reports that she has never smoked. She has never used smokeless tobacco.   Medications & Allergies reviewed per EMR Patient Active Problem List   Diagnosis Date Noted  . Chronic bilateral low back pain with bilateral sciatica 11/16/2016  . Osteoarthritis of  spine with radiculopathy, lumbar region 11/16/2016  . Polyarthralgia 11/16/2016  . Primary osteoarthritis of both knees 11/16/2016  . Onychomycosis 11/22/2015  . Overweight (BMI 25.0-29.9) 06/25/2015  . 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 Allergic conjunctivitis and rhinitis 01/17/2014  . 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 Fibromyalgia 07/19/2013  . HTN (hypertension) 07/19/2013  . HLD (hyperlipidemia) 07/19/2013  . Ulcerative colitis ([place]) 07/19/2013   Past Medical History:  Diagnosis Date  . Arthritis   . Fibromyalgia   . GERD (gastroesophageal reflux disease)   . HLD (hyperlipidemia)   . HTN (hypertension)   . Hypothyroidism following radioiodine therapy   . Ulcerative colitis ([place]) 1976   Family History  Problem Relation Age of Onset  . Cancer Father   . Heart failure Mother   . Hyperlipidemia Mother   . Osteoporosis Mother   . Hyperlipidemia Brother   . Hyperlipidemia Sister    Past Surgical History:  Procedure Laterality Date  . BREAST REDUCTION SURGERY    . BREAST REDUCTION SURGERY Bilateral 06/22/2005  . LUMBAR DISC SURGERY     Social History   Occupational History  . Unemployed    Social History Main Topics  . Smoking status: Never Smoker  . Smokeless tobacco: Never Used  . Alcohol use No  . Drug use: No  . Sexual activity: Yes    Partners: Male     Comment: Married.    OBJECTIVE:  VS:  HT:5' (152.4 cm)   WT:142 lb 6.4 oz (64.6 kg)  BMI:27.9    BP:130/90  HR:81bpm  TEMP: ( )  RESP:99 % Physical Exam Ortho Exam Findings:  WDWN, NAD, Non-toxic appearing Alert & appropriately interactive Not depressed or anxious appearing No increased work of breathing. Pupils are equal. EOM intact without nystagmus No clubbing or cyanosis of the extremities appreciated No significant rashes/lesions/ulcerations overlying the examined area. Radial pulses 2+/4.  No significant generalized UE edema. DP & PT pulses 2+/4.  No significant pretibial  edema.  No clubbing or cyanosis Sensation intact to light touch in upper and lower extremities.  Back & Lower Extremities: Tight bilateral straight leg raises but no focal pain. No significant midline tenderness.   TTP over bilateral paraspinal musculature and bilateral PSIS. Good internal and external rotation of the hips. Manual muscle testing is 5+/5 in BLE myotomes without focality Lower extremity DTRs 2+/4 diffusely and symmetric  Bilateral knees: Overall well aligned.  No significant deformity. Ligamentously stable. No effusion    Dg Lumbar Spine 2-3 Views  Result Date: 10/21/2016 CLINICAL DATA:  Chronic bilateral low back pain. EXAM: LUMBAR SPINE - 2-3 VIEW COMPARISON:  None. FINDINGS: Rightward scoliosis in the lumbar spine. Diffuse degenerative disc and facet disease throughout the lumbar spine. 10 mm anterolisthesis of L5 on S1 related to facet disease. No fracture. IMPRESSION: Diffuse degenerative disc and facet disease. Grade 1 anterolisthesis of L5 on S1. Electronically Signed   By: Rolm Baptise M.D.   On: 10/21/2016 10:36   ASSESSMENT & PLAN:   Problem List Items Addressed This Visit    Chronic bilateral low back pain with bilateral sciatica   Relevant Orders   DG Lumbar Spine 2-3 Views (Completed)   Osteoarthritis of spine with radiculopathy, lumbar region    Grade 1 anterolisthesis of L5 on S1.  Suspect large component of her underlying issues is coming from potential radicular component.  Should respond well to steroids for her knees either way.  Home exercise program provided through [facility] spine conditioning program.  Formal referral to physical therapy and/or ATC follow-up can be considered.      Polyarthralgia    Patient has had polyarthralgia complaints as well as fatigue reported. Given diffuse changes within her back and peripheral joints autoimmune workup will be checked today.      Relevant Orders   CBC with Differential/Platelet (Completed)    Comprehensive metabolic panel (Completed)   Cyclic citrul peptide antibody, IgG (Completed)   Rheumatoid factor (Completed)   C-reactive protein (Completed)   Sedimentation rate (Completed)   VITAMIN D 25 Hydroxy (Vit-D Deficiency, Fractures) (Completed)   Uric acid (Completed)   ANA (Completed)   Anti-nuclear ab-titer (ANA titer) (Completed)   Primary osteoarthritis of both knees - Primary    Bilateral knee injections performed today.  Ultrasound revealed degenerative changes. Body Healix compression sleeve and VMO strengthening exercises discussed.  Suspect some of her bilateral leg pain is attributable to this as well as a radicular component. PROCEDURE NOTE -  ULTRASOUND GUIDEDINJECTION: Bilateral Knee Injections Images were obtained and interpreted by myself, Teresa Coombs, DO  Images have been saved and stored to PACS system. Images obtained on: GE S7 Ultrasound machine  ULTRASOUND FINDINGS:  Degenerative changes tricompartments of bilateral knees. No significant bursal swelling over the hip.  DESCRIPTION OF PROCEDURE:  The patient's clinical condition is marked by substantial pain and/or significant functional disability. Other conservative therapy has not provided relief, is contraindicated, or not appropriate. There is a reasonable likelihood that injection will significantly improve the patient's pain and/or functional impairment. After discussing the risks, benefits and expected outcomes of the  injection and all questions were reviewed and answered, the patient wished to undergo the above named procedure. Verbal consent was obtained. The ultrasound was used to identify the target structure and adjacent neurovascular structures. The skin was then prepped in sterile fashion and the target structure was injected under direct visualization using sterile technique as below: RIGHT:  PREP: Alcohol, Ethel Chloride APPROACH: Superior lateral, single injection 21 g 2"  needle INJECTATE: 2 cc 0.5% marcaine, 1 cc 53m DepoMedrol ASPIRATE: N/A DRESSING: Band-Aid LEFT: PREP: Alcohol, Ethel Chloride APPROACH: Superior lateral, single injection 21 g 2" needle INJECTATE: 2 cc 0.5% marcaine, 1 cc 430mDepoMedrol ASPIRATE: N/A DRESSING: Band-Aid  Post procedural instructions including recommending icing and warning signs for infection were reviewed. This procedure was well tolerated and there were no complications.   IMPRESSION: Succesful USKoreauided Injections          Relevant Orders   US[place]IMITED JOINT SPACE STRUCTURES LOW BILAT(NO LINKED CHARGES)    Other Visit Diagnoses    Chronic fatigue       Relevant Orders   CBC with Differential/Platelet (Completed)   Comprehensive metabolic panel (Completed)   Cyclic citrul peptide antibody, IgG (Completed)   Rheumatoid factor (Completed)   C-reactive protein (Completed)   Sedimentation rate (Completed)   VITAMIN D 25 Hydroxy (Vit-D Deficiency, Fractures) (Completed)   Uric acid (Completed)   ANA (Completed)   Anti-nuclear ab-titer (ANA titer) (Completed)      Follow-up: Return in about 6 weeks (around 12/02/2016).   CMA/ATC served as scEducation administratoruring this visit. History, Physical, and Plan performed by medical provider. Documentation and orders reviewed and attested to.      MiTeresa CoombsDOApollo Beachports Medicine Physician

## 2016-10-21 NOTE — Progress Notes (Deleted)
  OFFICE VISIT NOTE Erika Osborn. Erika Osborn, Erika Osborn at [facility] [phone]  [name] - 66 y.o. female MRN [id number]  Date of birth: 06-Nov-1950  Visit Date: 10/21/2016  PCP: Briscoe Deutscher, DO   Referred by: Briscoe Deutscher, DO  {Scribe Credentials} acting as scribe for Dr. Paulla Fore.  SUBJECTIVE:   Chief Complaint  Patient presents with  . NP: Bilateral Knee and LBP   HPI: As below and per problem based documentation when appropriate.  HPI  ROS  Otherwise per HPI.  HISTORY & PERTINENT PRIOR DATA:  No specialty comments available. She reports that she has never smoked. She has never used smokeless tobacco. No results for input(s): HGBA1C, LABURIC in the last 8760 hours. Medications & Allergies reviewed per EMR Patient Active Problem List   Diagnosis Date Noted  . Onychomycosis 11/22/2015  . Overweight (BMI 25.0-29.9) 06/25/2015  . 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 Allergic conjunctivitis and rhinitis 01/17/2014  . 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 Fibromyalgia 07/19/2013  . HTN (hypertension) 07/19/2013  . HLD (hyperlipidemia) 07/19/2013  . Ulcerative colitis ([place]) 07/19/2013   Past Medical History:  Diagnosis Date  . Arthritis   . Fibromyalgia   . GERD (gastroesophageal reflux disease)   . HLD (hyperlipidemia)   . HTN (hypertension)   . Hypothyroidism following radioiodine therapy   . Ulcerative colitis ([place]) 1976   Family History  Problem Relation Age of Onset  . Cancer Father   . Heart failure Mother   . Hyperlipidemia Mother   . Osteoporosis Mother   . Hyperlipidemia Brother   . Hyperlipidemia Sister    Past Surgical History:  Procedure Laterality Date  . BREAST REDUCTION SURGERY    . LUMBAR DISC SURGERY     Social History   Occupational History  . Unemployed    Social History Main Topics  . Smoking status: Never Smoker  . Smokeless tobacco: Never Used  . Alcohol use No  . Drug use: No    . Sexual activity: Yes    Partners: Male     Comment: Married.    OBJECTIVE:  VS:  HT:    WT:   BMI:     BP:   HR: bpm  TEMP: ( )  RESP:  Physical Exam Ortho Exam  IMAGING & PROCEDURES: No results found. No additional findings.   ASSESSMENT & PLAN:  Visit Diagnoses: No diagnosis found. Meds: No orders of the defined types were placed in this encounter.   Orders: No orders of the defined types were placed in this encounter.   Follow-up: No Follow-up on file.  Otherwise please see problem oriented charting as below.  CMA/ATC served as Education administrator during this visit. History, Physical, and Plan performed by medical provider. Documentation and orders reviewed and attested to.      Teresa Coombs, [place] [facility] Medicine Physician    10/21/2016 10:03 AM

## 2016-10-21 NOTE — Patient Instructions (Signed)
I recommend you obtained a compression sleeve to help with your joint problems. There are many options on the market however I recommend obtaining a FULL KNEE Body Helix compression sleeve.  You can find information (including how to appropriate measure yourself for sizing) can be found at www.Body http://www.lambert.com/.  Many of these products are health savings account (HSA) eligible.   You can use the compression sleeve at any time throughout the day but is most important to use while being active as well as for 2 hours post-activity.   It is appropriate to ice following activity with the compression sleeve in place.

## 2016-10-22 LAB — ANA: Anti Nuclear Antibody(ANA): POSITIVE — AB

## 2016-10-22 LAB — RHEUMATOID FACTOR

## 2016-10-22 LAB — ANTI-NUCLEAR AB-TITER (ANA TITER)

## 2016-10-22 LAB — CYCLIC CITRUL PEPTIDE ANTIBODY, IGG: CYCLIC CITRULLIN PEPTIDE AB: 23 U — AB

## 2016-10-29 ENCOUNTER — Other Ambulatory Visit: Payer: Self-pay

## 2016-10-29 DIAGNOSIS — R899 Unspecified abnormal finding in specimens from other organs, systems and tissues: Secondary | ICD-10-CM

## 2016-11-16 DIAGNOSIS — M170 Bilateral primary osteoarthritis of knee: Secondary | ICD-10-CM | POA: Insufficient documentation

## 2016-11-16 DIAGNOSIS — M5442 Lumbago with sciatica, left side: Secondary | ICD-10-CM

## 2016-11-16 DIAGNOSIS — G8929 Other chronic pain: Secondary | ICD-10-CM | POA: Insufficient documentation

## 2016-11-16 DIAGNOSIS — M2550 Pain in unspecified joint: Secondary | ICD-10-CM | POA: Insufficient documentation

## 2016-11-16 DIAGNOSIS — M5441 Lumbago with sciatica, right side: Secondary | ICD-10-CM

## 2016-11-16 DIAGNOSIS — M4726 Other spondylosis with radiculopathy, lumbar region: Secondary | ICD-10-CM | POA: Insufficient documentation

## 2016-11-16 NOTE — Assessment & Plan Note (Signed)
Bilateral knee injections performed today.  Ultrasound revealed degenerative changes. Body Healix compression sleeve and VMO strengthening exercises discussed.  Suspect some of her bilateral leg pain is attributable to this as well as a radicular component. PROCEDURE NOTE -  ULTRASOUND GUIDEDINJECTION: Bilateral Knee Injections Images were obtained and interpreted by myself, Teresa Coombs, DO  Images have been saved and stored to PACS system. Images obtained on: GE S7 Ultrasound machine  ULTRASOUND FINDINGS:  Degenerative changes tricompartments of bilateral knees. No significant bursal swelling over the hip.  DESCRIPTION OF PROCEDURE:  The patient's clinical condition is marked by substantial pain and/or significant functional disability. Other conservative therapy has not provided relief, is contraindicated, or not appropriate. There is a reasonable likelihood that injection will significantly improve the patient's pain and/or functional impairment. After discussing the risks, benefits and expected outcomes of the injection and all questions were reviewed and answered, the patient wished to undergo the above named procedure. Verbal consent was obtained. The ultrasound was used to identify the target structure and adjacent neurovascular structures. The skin was then prepped in sterile fashion and the target structure was injected under direct visualization using sterile technique as below: RIGHT:  PREP: Alcohol, Ethel Chloride APPROACH: Superior lateral, single injection 21 g 2" needle INJECTATE: 2 cc 0.5% marcaine, 1 cc 28m DepoMedrol ASPIRATE: N/A DRESSING: Band-Aid  LEFT: PREP: Alcohol, Ethel Chloride APPROACH: Superior lateral, single injection 21 g 2" needle INJECTATE: 2 cc 0.5% marcaine, 1 cc 449mDepoMedrol ASPIRATE: N/A DRESSING: Band-Aid  Post procedural instructions including recommending icing and warning signs for infection were reviewed. This procedure was well  tolerated and there were no complications.   IMPRESSION: Succesful USKoreauided Injections

## 2016-11-16 NOTE — Assessment & Plan Note (Signed)
Patient has had polyarthralgia complaints as well as fatigue reported. Given diffuse changes within her back and peripheral joints autoimmune workup will be checked today.

## 2016-11-16 NOTE — Assessment & Plan Note (Signed)
Grade 1 anterolisthesis of L5 on S1.  Suspect large component of her underlying issues is coming from potential radicular component.  Should respond well to steroids for her knees either way.  Home exercise program provided through [facility] spine conditioning program.  Formal referral to physical therapy and/or ATC follow-up can be considered.

## 2016-11-23 ENCOUNTER — Other Ambulatory Visit: Payer: Self-pay

## 2016-11-23 ENCOUNTER — Telehealth: Payer: Self-pay | Admitting: Family Medicine

## 2016-11-23 DIAGNOSIS — M545 Low back pain, unspecified: Secondary | ICD-10-CM

## 2016-11-23 DIAGNOSIS — M797 Fibromyalgia: Secondary | ICD-10-CM

## 2016-11-23 DIAGNOSIS — M25561 Pain in right knee: Secondary | ICD-10-CM

## 2016-11-23 DIAGNOSIS — G8929 Other chronic pain: Secondary | ICD-10-CM

## 2016-11-23 DIAGNOSIS — M25562 Pain in left knee: Secondary | ICD-10-CM

## 2016-11-23 MED ORDER — TRAMADOL HCL 50 MG PO TABS: ORAL_TABLET | ORAL | 0 refills | Status: DC

## 2016-11-23 NOTE — Telephone Encounter (Signed)
Spoke with patient and advised that we would fax refill to her pharmacy.  Refill faxed to [facility] on [address] and [address] per patient request.

## 2016-11-23 NOTE — Telephone Encounter (Signed)
Patient is requesting a refill of traMADol (ULTRAM) 50 MG tablet [[id number]] . States that she will be out as of tomorrow morning. Verified cell #

## 2016-11-23 NOTE — Telephone Encounter (Signed)
Please advise.  Ok to fill?

## 2016-11-23 NOTE — Telephone Encounter (Signed)
Okay to refill? 

## 2016-11-30 ENCOUNTER — Encounter: Payer: Self-pay | Admitting: Sports Medicine

## 2016-11-30 ENCOUNTER — Ambulatory Visit (INDEPENDENT_AMBULATORY_CARE_PROVIDER_SITE_OTHER): Payer: 59 | Admitting: Sports Medicine

## 2016-11-30 VITALS — BP 140/80 | HR 77 | Ht 60.0 in | Wt 140.4 lb

## 2016-11-30 DIAGNOSIS — M797 Fibromyalgia: Secondary | ICD-10-CM | POA: Diagnosis not present

## 2016-11-30 DIAGNOSIS — G8929 Other chronic pain: Secondary | ICD-10-CM | POA: Diagnosis not present

## 2016-11-30 DIAGNOSIS — R768 Other specified abnormal immunological findings in serum: Secondary | ICD-10-CM | POA: Diagnosis not present

## 2016-11-30 DIAGNOSIS — M4726 Other spondylosis with radiculopathy, lumbar region: Secondary | ICD-10-CM

## 2016-11-30 DIAGNOSIS — M5442 Lumbago with sciatica, left side: Secondary | ICD-10-CM

## 2016-11-30 DIAGNOSIS — M170 Bilateral primary osteoarthritis of knee: Secondary | ICD-10-CM

## 2016-11-30 DIAGNOSIS — M5441 Lumbago with sciatica, right side: Secondary | ICD-10-CM | POA: Diagnosis not present

## 2016-11-30 MED ORDER — DIAZEPAM 5 MG PO TABS: 5.0000 mg | ORAL_TABLET | Freq: Once | ORAL | 0 refills | Status: AC

## 2016-11-30 NOTE — Patient Instructions (Signed)
We are ordering an MRI for you today.  The imaging office will be calling you to schedule your appointment after we obtain authorization from your insurance company.   Please be sure you have signed up for MyChart so that we can get your results to you.  We will be in touch with you as soon as we can.  Please know, it can take up to 3-4 business days for the radiologist and Dr. Paulla Fore to have time to review the results and determine the best appropriate action.  If there is something that appears to be surgical or needs a referral to other specialists we will let you know through [place] or telephone.  Otherwise we will plan to schedule a follow up appointment with Dr. Paulla Fore once we have the results.

## 2016-11-30 NOTE — Progress Notes (Signed)
OFFICE VISIT NOTE Erika Osborn. Erika Osborn, [place] at [facility] [phone]  Erika Osborn - 66 y.o. female MRN [id number]  Date of birth: April 01, 1951  Visit Date: 11/30/2016  PCP: Briscoe Deutscher, DO   Referred by: Briscoe Deutscher, DO  Erika Osborn, cma acting as scribe for Dr. Paulla Fore.  SUBJECTIVE:   Chief Complaint  Patient presents with  . Follow Up Primary osteoarthritis of both knees  . Right Hip Pain   HPI: As below and per problem based documentation when appropriate.  Chronic pain of both knees: Onset of the symptoms was several years ago. Reports relief with steriod injections at last office visit. There is still pain from going from a sitting to standing position but not as bad. There is persistent clicking and popping. She is wearing her the body helix braces daily.   RIGHT HIP: New onset. Pain is triggered constantly. No relief with sitting, standing or walking. Sx started x3 weeks ago.  No swelling.    Review of Systems  Constitutional: Negative for chills and fever.  HENT: Negative.   Eyes: Negative.   Respiratory: Negative.   Cardiovascular: Negative.   Gastrointestinal: Negative.   Genitourinary: Negative.   Musculoskeletal: Positive for back pain and joint pain.  Skin: Negative.   Neurological: Negative for dizziness.  Endo/Heme/Allergies: Negative.   Psychiatric/Behavioral: Negative for depression, hallucinations, substance abuse and suicidal ideas. The patient is not nervous/anxious and does not have insomnia.     Otherwise per HPI.  HISTORY & PERTINENT PRIOR DATA:  No specialty comments available. She reports that she has never smoked. She has never used smokeless tobacco.   Recent Labs  10/21/16 1051  LABURIC 4.5   Medications & Allergies reviewed per EMR Patient Active Problem List   Diagnosis Date Noted  . Positive anti-CCP test 11/30/2016  . Chronic bilateral low back pain with bilateral sciatica  11/16/2016  . Osteoarthritis of spine with radiculopathy, lumbar region 11/16/2016  . Polyarthralgia 11/16/2016  . Primary osteoarthritis of both knees 11/16/2016  . Onychomycosis 11/22/2015  . Overweight (BMI 25.0-29.9) 06/25/2015  . 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 Allergic conjunctivitis and rhinitis 01/17/2014  . 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 Fibromyalgia 07/19/2013  . HTN (hypertension) 07/19/2013  . HLD (hyperlipidemia) 07/19/2013  . Ulcerative colitis ([place]) 07/19/2013   Past Medical History:  Diagnosis Date  . Arthritis   . Fibromyalgia   . GERD (gastroesophageal reflux disease)   . HLD (hyperlipidemia)   . HTN (hypertension)   . Hypothyroidism following radioiodine therapy   . Ulcerative colitis ([place]) 1976   Family History  Problem Relation Age of Onset  . Cancer Father   . Heart failure Mother   . Hyperlipidemia Mother   . Osteoporosis Mother   . Hyperlipidemia Brother   . Hyperlipidemia Sister    Past Surgical History:  Procedure Laterality Date  . BREAST REDUCTION SURGERY    . BREAST REDUCTION SURGERY Bilateral 06/22/2005  . LUMBAR DISC SURGERY     Social History   Occupational History  . Unemployed    Social History Main Topics  . Smoking status: Never Smoker  . Smokeless tobacco: Never Used  . Alcohol use No  . Drug use: No  . Sexual activity: Yes    Partners: Male     Comment: Married.    OBJECTIVE:  VS:  HT:5' (152.4 cm)   WT:140 lb 6.4 oz (63.7 kg)  BMI:27.5    BP:140/80  HR:77bpm  TEMP: ( )  RESP:98 % EXAM: Findings:  WDWN, NAD, Non-toxic appearing Alert & appropriately interactive Not depressed or anxious appearing No increased work of breathing. Pupils are equal. EOM intact without nystagmus No clubbing or cyanosis of the extremities appreciated No significant rashes/lesions/ulcerations overlying the examined area. DP & PT pulses 2+/4.  No significant pretibial edema. Sensation intact to light touch in all  lower extremity dermatomes.  Bilateral knees: Overall no significant effusion today.  She is wearing a Body Helix compression sleeve on both knees.  She is ligamentously stable.  Minimal pain with McMurray testing.  Full range of motion bilaterally.  Low back: She has markedly tight hamstrings bilaterally with no radicular component.  She does have pain at the right sacral base.  No focal midline tenderness.       No results found. ASSESSMENT & PLAN:   Problem List Items Addressed This Visit    Fibromyalgia   Chronic bilateral low back pain with bilateral sciatica   Relevant Medications   diazepam (VALIUM) 5 MG tablet   Other Relevant Orders   MR Lumbar Spine Wo Contrast   Osteoarthritis of spine with radiculopathy, lumbar region    She is seemingly having worsening radicular pain with marked pain over the greater sciatic notch today.  I suspect this is contributing largely to her leg pain.  She has failed conservative measures and had had issues with this for quite some time but marked worsening of her neurogenic based pain on the right is complicated by the underlying diagnosis of fibromyalgia.  Before changing medications or titrating any type of gabapentinoids we will obtain further diagnostic evaluation with MRI.      Relevant Medications   diazepam (VALIUM) 5 MG tablet   Primary osteoarthritis of both knees - Primary    Her knee pain is moderately improved with the injections as well as with Body Helix compression sleeves.  We will have her continue using the compression sleeves as well as continuing to perform therapeutic exercises working on VMO and hip abduction strengthening.  We can consider Visco supplementation in the future but will hold off on further therapies at this time.      Relevant Orders   MR Lumbar Spine Wo Contrast   Positive anti-CCP test    Slightly positive test with a mildly positive ANA.  She does have an upcoming rheumatology appointment later this month.   Complicating this is underlying fibromyalgia however but given normal inflammatory markers autoimmune disease does seem to be less likely but will defer to Dr. Arlean Hopping expertise.         Follow-up: Return for MRI Review.   CMA/ATC served as Education administrator during this visit. History, Physical, and Plan performed by medical provider. Documentation and orders reviewed and attested to.      Erika Osborn, [facility] Physician

## 2016-11-30 NOTE — Assessment & Plan Note (Signed)
Slightly positive test with a mildly positive ANA.  She does have an upcoming rheumatology appointment later this month.  Complicating this is underlying fibromyalgia however but given normal inflammatory markers autoimmune disease does seem to be less likely but will defer to Dr. Arlean Hopping expertise.

## 2016-11-30 NOTE — Assessment & Plan Note (Signed)
She is seemingly having worsening radicular pain with marked pain over the greater sciatic notch today.  I suspect this is contributing largely to her leg pain.  She has failed conservative measures and had had issues with this for quite some time but marked worsening of her neurogenic based pain on the right is complicated by the underlying diagnosis of fibromyalgia.  Before changing medications or titrating any type of gabapentinoids we will obtain further diagnostic evaluation with MRI.

## 2016-11-30 NOTE — Assessment & Plan Note (Signed)
Her knee pain is moderately improved with the injections as well as with Body Helix compression sleeves.  We will have her continue using the compression sleeves as well as continuing to perform therapeutic exercises working on VMO and hip abduction strengthening.  We can consider Visco supplementation in the future but will hold off on further therapies at this time.

## 2016-12-02 ENCOUNTER — Ambulatory Visit: Payer: 59 | Admitting: Sports Medicine

## 2016-12-17 ENCOUNTER — Other Ambulatory Visit: Payer: 59

## 2016-12-24 NOTE — Progress Notes (Signed)
Office Visit Note  Patient: Erika Osborn             Date of Birth: 22-Dec-1950           MRN: [id number]             PCP: Briscoe Deutscher, DO Referring: Briscoe Deutscher, DO Visit Date: 12/30/2016 Occupation: @GUAROCC @    Subjective:  Pain in multiple joints   History of Present Illness: Erika Osborn is a 66 y.o. female seen in consultation per request of Dr. Paulla Fore. According to patient she was diagnosed with ulcerative colitis when she was 66 years old. She was initially treated with prednisone and different medications. She recalls being on Imuran since 2008. She was also treated with Humira about 8-10 years ago she does not recall for how much duration she was on Humira she felt it was not effective and it caused breathing difficulties and she discontinued that. She also tried IV Remicade but had no response to that and discontinued the medication. She's been under care of Dr. Watt Climes. She states her ulcerative colitis is still active. She has about 4 flares of ulcerative colitis a year. She's having a flare right now. She has about 5 loose stools per day with some blood.   She's been having joint pain for the last 10 years which she describes in her knee joints, bilateral thumb and recently in her shoulders. She states she developed popliteal cyst in her knee joints. She was seen by Dr. Paulla Fore and had cortisone injection to her knee joints about a month ago which helped her knee to some extent. She is also scheduled to have MRI of her knee joint. She carries a diagnosis of fibromyalgia for which she uses tramadol for pain management. She gives history of hyperalgesia.  Activities of Daily Living:  Patient reports morning stiffness for 30 minutes.   Patient Reports nocturnal pain.  Difficulty dressing/grooming: Denies Difficulty climbing stairs: Reports Difficulty getting out of chair: Reports Difficulty using hands for taps, buttons, cutlery, and/or writing: Denies   Review of Systems    Constitutional: Positive for fatigue. Negative for night sweats, weight gain, weight loss and weakness.  HENT: Positive for mouth sores. Negative for trouble swallowing, trouble swallowing, mouth dryness and nose dryness.   Eyes: Positive for dryness. Negative for pain, redness and visual disturbance.  Respiratory: Negative for cough, shortness of breath and difficulty breathing.   Cardiovascular: Negative for chest pain, palpitations, hypertension, irregular heartbeat and swelling in legs/feet.  Gastrointestinal: Positive for blood in stool and diarrhea. Negative for constipation.  Endocrine: Negative for increased urination.  Genitourinary: Negative for vaginal dryness.  Musculoskeletal: Positive for arthralgias, joint pain, myalgias, morning stiffness and myalgias. Negative for joint swelling, muscle weakness and muscle tenderness.  Skin: Negative for color change, rash, hair loss, skin tightness, ulcers and sensitivity to sunlight.  Allergic/Immunologic: Negative for susceptible to infections.  Neurological: Negative for dizziness, memory loss and night sweats.  Hematological: Negative for swollen glands.  Psychiatric/Behavioral: Positive for depressed mood and sleep disturbance. The patient is nervous/anxious.     PMFS History:  Patient Active Problem List   Diagnosis Date Noted  . Positive anti-CCP test 11/30/2016  . Chronic bilateral low back pain with bilateral sciatica 11/16/2016  . Osteoarthritis of spine with radiculopathy, lumbar region 11/16/2016  . Polyarthralgia 11/16/2016  . Primary osteoarthritis of both knees 11/16/2016  . Onychomycosis 11/22/2015  . Overweight (BMI 25.0-29.9) 06/25/2015  . 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 Allergic  conjunctivitis and rhinitis 01/17/2014  . 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 Fibromyalgia 07/19/2013  . HTN (hypertension) 07/19/2013  . HLD (hyperlipidemia) 07/19/2013  . Ulcerative colitis ([place]) 07/19/2013    Past  Medical History:  Diagnosis Date  . Arthritis   . Fibromyalgia   . GERD (gastroesophageal reflux disease)   . HLD (hyperlipidemia)   . HTN (hypertension)   . Hypothyroidism following radioiodine therapy   . Ulcerative colitis ([place]) 1976    Family History  Problem Relation Age of Onset  . Cancer Father   . Heart failure Mother   . Hyperlipidemia Mother   . Osteoporosis Mother   . Hyperlipidemia Brother   . Hyperlipidemia Sister    Past Surgical History:  Procedure Laterality Date  . BREAST REDUCTION SURGERY    . BREAST REDUCTION SURGERY Bilateral 06/22/2005  . LUMBAR DISC SURGERY     Social History   Social History Narrative  . No narrative on file     Objective: Vital Signs: BP 120/74   Pulse 74   Resp 14   Ht 5' (1.524 m)   Wt 140 lb (63.5 kg)   BMI 27.34 kg/m    Physical Exam  Constitutional: She is oriented to person, place, and time. She appears well-developed and well-nourished.  HENT:  Head: Normocephalic and atraumatic.  Eyes: Conjunctivae and EOM are normal.  Neck: Normal range of motion.  Cardiovascular: Normal rate, regular rhythm, normal heart sounds and intact distal pulses.   Pulmonary/Chest: Effort normal and breath sounds normal.  Abdominal: Soft. Bowel sounds are normal.  Lymphadenopathy:    She has no cervical adenopathy.  Neurological: She is alert and oriented to person, place, and time.  Skin: Skin is warm and dry. Capillary refill takes less than 2 seconds.  Psychiatric: She has a normal mood and affect. Her behavior is normal.  Nursing note and vitals reviewed.    Musculoskeletal Exam: C-spine good range of motion. She has thoracolumbar scoliosis with limitation of range of motion of her lumbar spine. Shoulder joints elbow joints wrist joints are good range of motion. She has bilateral PIP DIP and CMC thickening with no synovitis. Hip joints are good range of motion. She has painful range of motion of bilateral knee joints with some  warmth on palpation. She also had Baker's cyst palpable in right popliteal region. Ankle joints MTPs PIPs DIPs with good range of motion with no synovitis  CDAI Exam: No CDAI exam completed.    Investigation: Findings:   10/21/2016  ANA positive 1:40 titer,anti- CCP weak positive 23, C-reactive protein normal , Rheumatoid factor normal, Sedimentation rate normal, Uric acid normal, and Vitamin D normal       Imaging: Xr Hand 2 View Left  Result Date: 12/30/2016 Severe CMC joint narrowing all PIP/DIP joint narrowing no MCP joint narrowing or erosive changes were noted. Impression: These studies are consistent with severe osteoarthritis of the hand  Xr Hand 2 View Right  Result Date: 12/30/2016 Severe CMC joint narrowing all PIP/DIP joint narrowing no MCP joint narrowing or erosive changes were noted. Impression: These studies are consistent with severe osteoarthritis of the hand  Xr Knee 3 View Left  Result Date: 12/30/2016 Severe medial compartment narrowing with sclerosis. Intercondylar and lateral osteophytes were noted. Severe patellofemoral narrowing was noted. No chondrocalcinosis was noted. Impression: These findings are consistent with severe osteoarthritis and severe chondromalacia patella  Xr Knee 3 View Right  Result Date: 12/30/2016 Moderate to severe medial compartment narrowing with sclerosis.  Intercondylar and lateral osteophytes were noted. Severe patellofemoral narrowing was noted. No chondrocalcinosis was noted. Impression: These findings are consistent with severe osteoarthritis and severe chondromalacia patella   Speciality Comments: No specialty comments available.    Procedures:  No procedures performed Allergies: Humira [adalimumab]; Penicillins; Remicade [infliximab]; and Erythromycin   Assessment / Plan:     Visit Diagnoses: ANA positive - 1:40 NH, very low titer and nonspecific  Positive anti-CCP test - 23 (weak positive), RF-, ESR normal. She has low  titer anti-CCP antibody. Which could be associated with rheumatoid arthritis or ulcerative colitis.  Polyarthralgia: She complains of pain in multiple joints especially in her hands and her knee joints. I do not see any synovitis in her hands today.  History of ulcerative colitis - On Imuran 100 mg by mouth daily. fu by Dr. Watt Climes. Her UC is not well controlled. She is still having a flare which she relates to stress. Situation. She's tried Remicade with inadequate response per patient. She's also tried Humira and discontinued due to shortness of breath. She's not interested in starting a biologic therapy.  Pain in both hands, her clinical findings are consistent with osteoarthritis - Plan: XR Hand 2 View Right, XR Hand 2 View Left: X-rays were consistent with severe osteoarthritis of bilateral hands especially the CMC joints.  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 She does have some discomfort in her bilateral knee joints and had fairly good response to cortisone injection. She is also scheduled to have MRI of her knee joint. - Plan: XR KNEE 3 VIEW LEFT, XR KNEE 3 VIEW RIGHT. X-rays reveal severe osteoarthritis and severe chondromalacia patella  Chronic bilateral low back pain with bilateral sciatica - Status post fusion  History of fibromyalgia: It causes generalized pain and discomfort  History of hypertension: Her blood pressure is well controlled  History of hyperlipidemia  History of hypothyroidism    Orders: Orders Placed This Encounter  Procedures  . XR Hand 2 View Right  . XR Hand 2 View Left  . XR KNEE 3 VIEW LEFT  . XR KNEE 3 VIEW RIGHT   No orders of the defined types were placed in this encounter.   Face-to-face time spent with patient was 30 minutes. 50% of time was spent in counseling and coordination of care.  Follow-Up Instructions: Return for UC, OA.   Bo Merino, MD  Note - This record has been  created using Editor, commissioning.  Chart creation errors have been sought, but may not always  have been located. Such creation errors do not reflect on  the standard of medical care.

## 2016-12-30 ENCOUNTER — Encounter: Payer: Self-pay | Admitting: Rheumatology

## 2016-12-30 ENCOUNTER — Ambulatory Visit (INDEPENDENT_AMBULATORY_CARE_PROVIDER_SITE_OTHER): Payer: 59

## 2016-12-30 ENCOUNTER — Ambulatory Visit (INDEPENDENT_AMBULATORY_CARE_PROVIDER_SITE_OTHER): Payer: 59 | Admitting: Rheumatology

## 2016-12-30 ENCOUNTER — Telehealth: Payer: Self-pay | Admitting: Rheumatology

## 2016-12-30 VITALS — BP 120/74 | HR 74 | Resp 14 | Ht 60.0 in | Wt 140.0 lb

## 2016-12-30 DIAGNOSIS — M79641 Pain in right hand: Secondary | ICD-10-CM

## 2016-12-30 DIAGNOSIS — M5441 Lumbago with sciatica, right side: Secondary | ICD-10-CM | POA: Diagnosis not present

## 2016-12-30 DIAGNOSIS — Z8719 Personal history of other diseases of the digestive system: Secondary | ICD-10-CM | POA: Diagnosis not present

## 2016-12-30 DIAGNOSIS — M79642 Pain in left hand: Secondary | ICD-10-CM

## 2016-12-30 DIAGNOSIS — R768 Other specified abnormal immunological findings in serum: Secondary | ICD-10-CM

## 2016-12-30 DIAGNOSIS — Z8679 Personal history of other diseases of the circulatory system: Secondary | ICD-10-CM

## 2016-12-30 DIAGNOSIS — M5442 Lumbago with sciatica, left side: Secondary | ICD-10-CM

## 2016-12-30 DIAGNOSIS — M170 Bilateral primary osteoarthritis of knee: Secondary | ICD-10-CM

## 2016-12-30 DIAGNOSIS — M7502 Adhesive capsulitis of left shoulder: Secondary | ICD-10-CM | POA: Diagnosis not present

## 2016-12-30 DIAGNOSIS — G8929 Other chronic pain: Secondary | ICD-10-CM

## 2016-12-30 DIAGNOSIS — M2550 Pain in unspecified joint: Secondary | ICD-10-CM

## 2016-12-30 DIAGNOSIS — Z8639 Personal history of other endocrine, nutritional and metabolic disease: Secondary | ICD-10-CM | POA: Diagnosis not present

## 2016-12-30 DIAGNOSIS — Z8739 Personal history of other diseases of the musculoskeletal system and connective tissue: Secondary | ICD-10-CM | POA: Diagnosis not present

## 2016-12-30 NOTE — Patient Instructions (Signed)
Natural anti-inflammatories  You can purchase these at [employer], [employer] or online.  . Turmeric (capsules)  . Ginger (ginger root or capsules)  . Omega 3 (Fish, flax seeds, chia seeds, walnuts, almonds)  . Tart cherry (dried or extract)   Patient should be under the care of a physician while taking these supplements. This may not be reproduced without the permission of Dr. Bo Merino.

## 2016-12-30 NOTE — Telephone Encounter (Signed)
Patient request ov note from today be sent to Dr. Watt Climes, her Gastroenterologist also. Fax# [phone]. Phone# [phone]

## 2016-12-31 NOTE — Telephone Encounter (Signed)
Sent to Dr Watt Climes

## 2017-01-01 ENCOUNTER — Other Ambulatory Visit: Payer: Self-pay

## 2017-01-01 ENCOUNTER — Telehealth: Payer: Self-pay | Admitting: Family Medicine

## 2017-01-01 ENCOUNTER — Other Ambulatory Visit: Payer: Self-pay | Admitting: Family Medicine

## 2017-01-01 DIAGNOSIS — M545 Low back pain, unspecified: Secondary | ICD-10-CM

## 2017-01-01 DIAGNOSIS — M25562 Pain in left knee: Secondary | ICD-10-CM

## 2017-01-01 DIAGNOSIS — M797 Fibromyalgia: Secondary | ICD-10-CM

## 2017-01-01 DIAGNOSIS — M25561 Pain in right knee: Secondary | ICD-10-CM

## 2017-01-01 DIAGNOSIS — G8929 Other chronic pain: Secondary | ICD-10-CM

## 2017-01-01 MED ORDER — TRAMADOL HCL 50 MG PO TABS: ORAL_TABLET | ORAL | 0 refills | Status: DC

## 2017-01-01 NOTE — Telephone Encounter (Signed)
Rx faxed to patient's pharmacy.

## 2017-01-01 NOTE — Telephone Encounter (Signed)
Okay 

## 2017-01-01 NOTE — Telephone Encounter (Signed)
**  Remind patient they can make refill requests via MyChart**  Medication refill request (Name & Dosage):  traMADol (ULTRAM) 50 MG tablet  Preferred pharmacy (Name & Address):  [facility] [place], [address] [phone] (Phone) [phone] (Fax)   Other comments (if applicable):

## 2017-01-01 NOTE — Telephone Encounter (Signed)
Please advise on refill.

## 2017-01-01 NOTE — Telephone Encounter (Signed)
Rx sent to patient's pharmacy

## 2017-01-01 NOTE — Telephone Encounter (Signed)
Patient is aware that Amber is faxing rx to pharmacy right now. No further action required.

## 2017-01-05 ENCOUNTER — Telehealth: Payer: Self-pay | Admitting: Sports Medicine

## 2017-01-05 NOTE — Telephone Encounter (Signed)
Patient would like to purchase another set of compression knee sleeves size medium. Call patient to advise.

## 2017-01-05 NOTE — Telephone Encounter (Signed)
Tried calling pt with busy signal. Will try to call back in a little bit.

## 2017-01-05 NOTE — Telephone Encounter (Signed)
Pt is aware to come by the office at her convince. All of her information is ready to be completed.

## 2017-01-09 ENCOUNTER — Other Ambulatory Visit: Payer: 59

## 2017-01-12 ENCOUNTER — Telehealth: Payer: Self-pay | Admitting: Sports Medicine

## 2017-01-12 NOTE — Telephone Encounter (Signed)
Concerns about MRI b/c she in claustrophobic.  Patient feels she needs to be sedated.  Thank you,  -LL

## 2017-01-12 NOTE — Telephone Encounter (Signed)
Looks like she cancelled her appointment for MRI 12/17/16 and n/s 01/09/17

## 2017-01-13 ENCOUNTER — Other Ambulatory Visit: Payer: Self-pay

## 2017-01-13 DIAGNOSIS — M5442 Lumbago with sciatica, left side: Secondary | ICD-10-CM

## 2017-01-13 DIAGNOSIS — M5441 Lumbago with sciatica, right side: Secondary | ICD-10-CM

## 2017-01-13 DIAGNOSIS — G8929 Other chronic pain: Secondary | ICD-10-CM

## 2017-01-13 DIAGNOSIS — M4726 Other spondylosis with radiculopathy, lumbar region: Secondary | ICD-10-CM

## 2017-01-13 NOTE — Telephone Encounter (Signed)
Called pt and advised that new order has been placed and that MRI with anesthesia must be done at [facility]. Pt is OK with this.

## 2017-01-13 NOTE — Telephone Encounter (Signed)
Order has been placed for MRI with general anesthesia at [facility].

## 2017-01-13 NOTE — Telephone Encounter (Signed)
Called [facility], advised that pt will need to go to [facility] if she is requesting sedation.

## 2017-01-21 NOTE — Telephone Encounter (Signed)
Pt has been scheduled for 02/16/17 @ 9:00 AM. [facility] will be contacting pt for additional information. They will also be faxing over a form to be completed by Dr. Paulla Fore. Called pt and advised. Will await form.

## 2017-01-21 NOTE — Telephone Encounter (Signed)
Patient has spoken to several members of our staff this week and there has been no documentation.   No one has called her from [facility] to reschedule her MRI. She cancelled the one 06/28 b/c her daughter was in an accident. And she called to cancel 07/21 b/c she was ill.  Please give her a call back to discuss what she needs to do to get this scheduled.  Thank you,  -LL

## 2017-01-22 NOTE — Telephone Encounter (Signed)
Form to Dr. Paulla Fore for review/signature when he returns to the office.

## 2017-01-25 NOTE — Telephone Encounter (Signed)
Form has been completed.

## 2017-01-25 NOTE — Telephone Encounter (Signed)
Form has been faxed.

## 2017-01-26 DIAGNOSIS — R768 Other specified abnormal immunological findings in serum: Secondary | ICD-10-CM | POA: Insufficient documentation

## 2017-01-26 NOTE — Progress Notes (Signed)
Office Visit Note  Patient: Erika Osborn             Date of Birth: [birth year]           MRN: [id number]             PCP: Briscoe Deutscher, DO Referring: Briscoe Deutscher, DO Visit Date: 01/29/2017 Occupation: @GUAROCC @    Subjective:  Right hip pain.   History of Present Illness: Erika Osborn is a 66 y.o. female with history of ulcerative colitis and inflammatory arthritis. According to her she's been having increased pain in her multiple joints. She describes increased pain and discomfort in her right trochanteric area. She states she has difficulty walking sitting and also laying down on the right side. She's been having discomfort all over her spine. She seen Dr. Paulla Fore who is a schedule MRI of her spine. She also has discomfort in her knee joints in her hands. She reports some joint swelling in her hands and her knee joints. Currently her ulcerative colitis is not flaring. Her last flare was a week ago. During the flare she had 3-4 watery stools a day.  Activities of Daily Living:  Patient reports morning stiffness for 30 minutes.   Patient Reports nocturnal pain.  Difficulty dressing/grooming: Reports Difficulty climbing stairs: Reports Difficulty getting out of chair: Reports Difficulty using hands for taps, buttons, cutlery, and/or writing: Reports   Review of Systems  Constitutional: Positive for fatigue. Negative for night sweats, weight gain, weight loss and weakness.  HENT: Positive for mouth dryness. Negative for mouth sores, trouble swallowing, trouble swallowing and nose dryness.   Eyes: Positive for dryness. Negative for pain, redness and visual disturbance.  Respiratory: Negative for cough, shortness of breath and difficulty breathing.   Cardiovascular: Negative for chest pain, palpitations, hypertension, irregular heartbeat and swelling in legs/feet.  Gastrointestinal: Positive for diarrhea. Negative for blood in stool and constipation.  Endocrine: Negative for  increased urination.  Genitourinary: Negative for vaginal dryness.  Musculoskeletal: Positive for arthralgias, joint pain, joint swelling, myalgias, morning stiffness and myalgias. Negative for muscle weakness and muscle tenderness.  Skin: Negative for color change, rash, hair loss, skin tightness, ulcers and sensitivity to sunlight.  Allergic/Immunologic: Negative for susceptible to infections.  Neurological: Negative for dizziness, memory loss and night sweats.  Hematological: Negative for swollen glands.  Psychiatric/Behavioral: Positive for depressed mood and sleep disturbance. The patient is not nervous/anxious.     PMFS History:  Patient Active Problem List   Diagnosis Date Noted  . Primary osteoarthritis of both hands 01/28/2017  . ANA positive 01/26/2017  . Positive anti-CCP test 11/30/2016  . Chronic bilateral low back pain with bilateral sciatica 11/16/2016  . Osteoarthritis of spine with radiculopathy, lumbar region 11/16/2016  . Polyarthralgia 11/16/2016  . Primary osteoarthritis of both knees 11/16/2016  . Onychomycosis 11/22/2015  . Overweight (BMI 25.0-29.9) 06/25/2015  . 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 Allergic conjunctivitis and rhinitis 01/17/2014  . 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 Fibromyalgia 07/19/2013  . HTN (hypertension) 07/19/2013  . HLD (hyperlipidemia) 07/19/2013  . Ulcerative colitis ([place]) 07/19/2013    Past Medical History:  Diagnosis Date  . Arthritis   . Fibromyalgia   . GERD (gastroesophageal reflux disease)   . HLD (hyperlipidemia)   . HTN (hypertension)   . Hypothyroidism following radioiodine therapy   . Ulcerative colitis ([place]) 1976    Family History  Problem Relation Age of Onset  . Cancer Father   . Heart failure Mother   .  Hyperlipidemia Mother   . Osteoporosis Mother   . Hyperlipidemia Brother   . Hyperlipidemia Sister    Past Surgical History:  Procedure Laterality Date  . BREAST REDUCTION SURGERY    .  BREAST REDUCTION SURGERY Bilateral 06/22/2005  . LUMBAR DISC SURGERY     Social History   Social History Narrative  . No narrative on file     Objective: Vital Signs: BP 127/66 (BP Location: Left Arm, Patient Position: Sitting, Cuff Size: Normal)   Pulse 71   Ht 5' (1.524 m)   Wt 141 lb (64 kg)   BMI 27.54 kg/m    Physical Exam  Constitutional: She is oriented to person, place, and time. She appears well-developed and well-nourished.  HENT:  Head: Normocephalic and atraumatic.  Eyes: Conjunctivae and EOM are normal.  Neck: Normal range of motion.  Cardiovascular: Normal rate, regular rhythm, normal heart sounds and intact distal pulses.   Pulmonary/Chest: Effort normal and breath sounds normal.  Abdominal: Soft. Bowel sounds are normal.  Lymphadenopathy:    She has no cervical adenopathy.  Neurological: She is alert and oriented to person, place, and time.  Skin: Skin is warm and dry. Capillary refill takes less than 2 seconds.  Psychiatric: She has a normal mood and affect. Her behavior is normal.  Nursing note and vitals reviewed.    Musculoskeletal Exam: C-spine and thoracic lumbar spine some discomfort with range of motion. Shoulder joints elbow joints wrist joint MCPs PIPs DIPs with good range of motion with no synovitis. She does have DIP PIP thickening due to underlying osteoarthritis. Hip joints knee joints ankles MTPs PIPs with good range of motion. She does have osteoarthritis in her knee joints with limited extension. No warmth swelling or effusion was noted.  CDAI Exam: No CDAI exam completed.    Investigation: No additional findings. CBC Latest Ref Rng & Units 10/21/2016 07/19/2013  WBC 4.0 - 10.5 K/uL 4.5 5.1  Hemoglobin 12.0 - 15.0 g/dL 12.6 12.9  Hematocrit 36.0 - 46.0 % 37.0 38.2  Platelets 150.0 - 400.0 K/uL 379.0 289   CMP     Component Value Date/Time   NA 137 10/21/2016 1051   K 3.6 10/21/2016 1051   CL 99 10/21/2016 1051   CO2 33 (H)  10/21/2016 1051   GLUCOSE 92 10/21/2016 1051   BUN 14 10/21/2016 1051   CREATININE 0.66 10/21/2016 1051   CREATININE 0.60 07/19/2013 1427   CALCIUM 10.1 10/21/2016 1051   PROT 7.9 10/21/2016 1051   ALBUMIN 4.5 10/21/2016 1051   AST 16 10/21/2016 1051   ALT 12 10/21/2016 1051   ALKPHOS 73 10/21/2016 1051   BILITOT 0.5 10/21/2016 1051  10/21/2016 ANA 1:40 homogeneous, RF negative, anti-CCP 23 positive, uric acid 4.5, ESR 14, C-reactive protein normal, vitamin D 53.01  Imaging: No results found.  Speciality Comments: No specialty comments available.    Procedures:  No procedures performed Allergies: Humira [adalimumab]; Penicillins; Remicade [infliximab]; and Erythromycin   Assessment / Plan:     Visit Diagnoses: Other ulcerative colitis without complication ([place]) - Active disease.Patient is still has intermittent flares. She's been followed by gastroenterology. I do not see any component of inflammatory arthritis on examination.  High risk medication use - On Imuran 100 mg by mouth daily by Dr. Daisey Must.(Remicade-inadequate response, Humira-shortness of breath). Patient declined use of Biologics during last visit.  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 Anti-CCP antibody can be seen with ulcerative colitis.  ANA  positive  1:40 titer NH - Not significant  Polyarthralgia - no synovitis was noted.  Adhesive capsulitis of left shoulder: Doing better  Trochanteric bursitis of right hip: Different treatment options were discussed. At this point she would like to try some exercises. A handout on IT band exercises was given.  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 A handout on knee joint exercise was also given.  DDD (degenerative disc disease), lumbar - Status post fusion: She supposed to have MRI of her lumbar spine per Dr.  Paulla Fore.  History of hypertension  History of hyperlipidemia  History of fibromyalgia: She continues to have some generalized pain from fibromyalgia.  Hypothyroidism, postradioiodine therapy    Orders: No orders of the defined types were placed in this encounter.  No orders of the defined types were placed in this encounter.   Face-to-face time spent with patient was 30 minutes. Greater than 50% of time was spent in counseling and coordination of care.  Follow-Up Instructions: Return in about 5 months (around 07/01/2017) for ulcerative colitis, , Osteoarthritis.   Bo Merino, MD  Note - This record has been created using Editor, commissioning.  Chart creation errors have been sought, but may not always  have been located. Such creation errors do not reflect on  the standard of medical care.

## 2017-01-28 DIAGNOSIS — M19041 Primary osteoarthritis, right hand: Secondary | ICD-10-CM | POA: Insufficient documentation

## 2017-01-28 DIAGNOSIS — M19042 Primary osteoarthritis, left hand: Secondary | ICD-10-CM

## 2017-01-29 ENCOUNTER — Ambulatory Visit (INDEPENDENT_AMBULATORY_CARE_PROVIDER_SITE_OTHER): Payer: 59 | Admitting: Rheumatology

## 2017-01-29 ENCOUNTER — Encounter: Payer: Self-pay | Admitting: Rheumatology

## 2017-01-29 VITALS — BP 127/66 | HR 71 | Ht 60.0 in | Wt 141.0 lb

## 2017-01-29 DIAGNOSIS — E890 Postprocedural hypothyroidism: Secondary | ICD-10-CM

## 2017-01-29 DIAGNOSIS — M19041 Primary osteoarthritis, right hand: Secondary | ICD-10-CM

## 2017-01-29 DIAGNOSIS — Z8739 Personal history of other diseases of the musculoskeletal system and connective tissue: Secondary | ICD-10-CM

## 2017-01-29 DIAGNOSIS — M19042 Primary osteoarthritis, left hand: Secondary | ICD-10-CM

## 2017-01-29 DIAGNOSIS — R768 Other specified abnormal immunological findings in serum: Secondary | ICD-10-CM

## 2017-01-29 DIAGNOSIS — Z8679 Personal history of other diseases of the circulatory system: Secondary | ICD-10-CM | POA: Diagnosis not present

## 2017-01-29 DIAGNOSIS — K5180 Other ulcerative colitis without complications: Secondary | ICD-10-CM

## 2017-01-29 DIAGNOSIS — M2550 Pain in unspecified joint: Secondary | ICD-10-CM

## 2017-01-29 DIAGNOSIS — M7061 Trochanteric bursitis, right hip: Secondary | ICD-10-CM

## 2017-01-29 DIAGNOSIS — M5136 Other intervertebral disc degeneration, lumbar region: Secondary | ICD-10-CM | POA: Diagnosis not present

## 2017-01-29 DIAGNOSIS — Z79899 Other long term (current) drug therapy: Secondary | ICD-10-CM

## 2017-01-29 DIAGNOSIS — M7502 Adhesive capsulitis of left shoulder: Secondary | ICD-10-CM

## 2017-01-29 DIAGNOSIS — M170 Bilateral primary osteoarthritis of knee: Secondary | ICD-10-CM

## 2017-01-29 DIAGNOSIS — Z8639 Personal history of other endocrine, nutritional and metabolic disease: Secondary | ICD-10-CM | POA: Diagnosis not present

## 2017-01-29 NOTE — Patient Instructions (Signed)
Knee Exercises Ask your health care provider which exercises are safe for you. Do exercises exactly as told by your health care provider and adjust them as directed. It is normal to feel mild stretching, pulling, tightness, or discomfort as you do these exercises, but you should stop right away if you feel sudden pain or your pain gets worse.Do not begin these exercises until told by your health care provider. STRETCHING AND RANGE OF MOTION EXERCISES These exercises warm up your muscles and joints and improve the movement and flexibility of your knee. These exercises also help to relieve pain, numbness, and tingling. Exercise A: Knee Extension, Prone 1. Lie on your abdomen on a bed. 2. Place your left / right knee just beyond the edge of the surface so your knee is not on the bed. You can put a towel under your left / right thigh just above your knee for comfort. 3. Relax your leg muscles and allow gravity to straighten your knee. You should feel a stretch behind your left / right knee. 4. Hold this position for __________ seconds. 5. Scoot up so your knee is supported between repetitions. Repeat __________ times. Complete this stretch __________ times a day. Exercise B: Knee Flexion, Active  1. Lie on your back with both knees straight. If this causes back discomfort, bend your left / right knee so your foot is flat on the floor. 2. Slowly slide your left / right heel back toward your buttocks until you feel a gentle stretch in the front of your knee or thigh. 3. Hold this position for __________ seconds. 4. Slowly slide your left / right heel back to the starting position. Repeat __________ times. Complete this exercise __________ times a day. Exercise C: Quadriceps, Prone  1. Lie on your abdomen on a firm surface, such as a bed or padded floor. 2. Bend your left / right knee and hold your ankle. If you cannot reach your ankle or pant leg, loop a belt around your foot and grab the belt  instead. 3. Gently pull your heel toward your buttocks. Your knee should not slide out to the side. You should feel a stretch in the front of your thigh and knee. 4. Hold this position for __________ seconds. Repeat __________ times. Complete this stretch __________ times a day. Exercise D: Hamstring, Supine 1. Lie on your back. 2. Loop a belt or towel over the ball of your left / right foot. The ball of your foot is on the walking surface, right under your toes. 3. Straighten your left / right knee and slowly pull on the belt to raise your leg until you feel a gentle stretch behind your knee. ? Do not let your left / right knee bend while you do this. ? Keep your other leg flat on the floor. 4. Hold this position for __________ seconds. Repeat __________ times. Complete this stretch __________ times a day. STRENGTHENING EXERCISES These exercises build strength and endurance in your knee. Endurance is the ability to use your muscles for a long time, even after they get tired. Exercise E: Quadriceps, Isometric  1. Lie on your back with your left / right leg extended and your other knee bent. Put a rolled towel or small pillow under your knee if told by your health care provider. 2. Slowly tense the muscles in the front of your left / right thigh. You should see your kneecap slide up toward your hip or see increased dimpling just above the knee. This  motion will push the back of the knee toward the floor. 3. For __________ seconds, keep the muscle as tight as you can without increasing your pain. 4. Relax the muscles slowly and completely. Repeat __________ times. Complete this exercise __________ times a day. Exercise F: Straight Leg Raises - Quadriceps 1. Lie on your back with your left / right leg extended and your other knee bent. 2. Tense the muscles in the front of your left / right thigh. You should see your kneecap slide up or see increased dimpling just above the knee. Your thigh may  even shake a bit. 3. Keep these muscles tight as you raise your leg 4-6 inches (10-15 cm) off the floor. Do not let your knee bend. 4. Hold this position for __________ seconds. 5. Keep these muscles tense as you lower your leg. 6. Relax your muscles slowly and completely after each repetition. Repeat __________ times. Complete this exercise __________ times a day. Exercise G: Hamstring, Isometric 1. Lie on your back on a firm surface. 2. Bend your left / right knee approximately __________ degrees. 3. Dig your left / right heel into the surface as if you are trying to pull it toward your buttocks. Tighten the muscles in the back of your thighs to dig as hard as you can without increasing any pain. 4. Hold this position for __________ seconds. 5. Release the tension gradually and allow your muscles to relax completely for __________ seconds after each repetition. Repeat __________ times. Complete this exercise __________ times a day. Exercise H: Hamstring Curls  If told by your health care provider, do this exercise while wearing ankle weights. Begin with __________ weights. Then increase the weight by 1 lb (0.5 kg) increments. Do not wear ankle weights that are more than __________. 1. Lie on your abdomen with your legs straight. 2. Bend your left / right knee as far as you can without feeling pain. Keep your hips flat against the floor. 3. Hold this position for __________ seconds. 4. Slowly lower your leg to the starting position.  Repeat __________ times. Complete this exercise __________ times a day. Exercise I: Squats (Quadriceps) 1. Stand in front of a table, with your feet and knees pointing straight ahead. You may rest your hands on the table for balance but not for support. 2. Slowly bend your knees and lower your hips like you are going to sit in a chair. ? Keep your weight over your heels, not over your toes. ? Keep your lower legs upright so they are parallel with the table  legs. ? Do not let your hips go lower than your knees. ? Do not bend lower than told by your health care provider. ? If your knee pain increases, do not bend as low. 3. Hold the squat position for __________ seconds. 4. Slowly push with your legs to return to standing. Do not use your hands to pull yourself to standing. Repeat __________ times. Complete this exercise __________ times a day. Exercise J: Wall Slides (Quadriceps)  1. Lean your back against a smooth wall or door while you walk your feet out 18-24 inches (46-61 cm) from it. 2. Place your feet hip-width apart. 3. Slowly slide down the wall or door until your knees bend __________ degrees. Keep your knees over your heels, not over your toes. Keep your knees in line with your hips. 4. Hold for __________ seconds. Repeat __________ times. Complete this exercise __________ times a day. Exercise K: Straight Leg Raises -  Hip Abductors 1. Lie on your side with your left / right leg in the top position. Lie so your head, shoulder, knee, and hip line up. You may bend your bottom knee to help you keep your balance. 2. Roll your hips slightly forward so your hips are stacked directly over each other and your left / right knee is facing forward. 3. Leading with your heel, lift your top leg 4-6 inches (10-15 cm). You should feel the muscles in your outer hip lifting. ? Do not let your foot drift forward. ? Do not let your knee roll toward the ceiling. 4. Hold this position for __________ seconds. 5. Slowly return your leg to the starting position. 6. Let your muscles relax completely after each repetition. Repeat __________ times. Complete this exercise __________ times a day. Exercise L: Straight Leg Raises - Hip Extensors 1. Lie on your abdomen on a firm surface. You can put a pillow under your hips if that is more comfortable. 2. Tense the muscles in your buttocks and lift your left / right leg about 4-6 inches (10-15 cm). Keep your knee  straight as you lift your leg. 3. Hold this position for __________ seconds. 4. Slowly lower your leg to the starting position. 5. Let your leg relax completely after each repetition. Repeat __________ times. Complete this exercise __________ times a day. This information is not intended to replace advice given to you by your health care provider. Make sure you discuss any questions you have with your health care provider. Document Released: 04/22/2005 Document Revised: 03/02/2016 Document Reviewed: 04/14/2015 Elsevier Interactive Patient Education  2018 [place] Ask your health care provider which exercises are safe for you. Do exercises exactly as told by your health care provider and adjust them as directed. It is normal to feel mild stretching, pulling, tightness, or discomfort as you do these exercises, but you should stop right away if you feel sudden pain or your pain gets worse.Do not begin these exercises until told by your health care provider. Stretching and range of motion exercises These exercises warm up your muscles and joints and improve the movement and flexibility of your leg. These exercises also help to relieve pain and stiffness. Exercise A: Quadriceps stretch, prone  1. Lie on your abdomen on a firm surface, such as a bed or padded floor. 2. Bend your left / right knee and hold your ankle. If you cannot reach your ankle or pant leg, loop a belt around your foot and grab the belt instead. 3. Gently pull your heel toward your buttocks. Your knee should not slide out to the side. You should feel a stretch in the front of your thigh and knee. 4. Hold this position for __________ seconds. Repeat __________ times. Complete this exercise __________ times a day. Exercise B: Lunge ( adductor stretch) 1. Stand and spread your legs about 3 feet (about 1 m) apart. Put your left / right leg slightly back for balance. 2. Lean away from your left / right  leg by bending your other knee and shifting your weight toward your bent knee. You may rest your hands on your thigh for balance. You should feel a stretch in your left / right inner thigh. 3. Hold for __________ seconds. Repeat __________ times. Complete this exercise __________ times a day. Exercise C: Hamstring stretch, supine  1. Lie on your back. 2. Hold both ends of a belt or towel as you loop it over the ball of  your left / right foot. The ball of your foot is on the walking surface, right under your toes. 3. Straighten your left / right knee and slowly pull on the belt to raise your leg. Stop when you feel a gentle stretch in the back of your left / right knee or thigh. ? Do not let your left / right knee bend. ? Keep your other leg flat on the floor. 4. Hold this position for __________ seconds. Repeat __________ times. Complete this exercise __________ times a day. Strengthening exercises These exercises build strength and endurance in your leg. Endurance is the ability to use your muscles for a long time, even after they get tired. Exercise D: Quadriceps wall slides  1. Lean your back against a smooth wall or door while you walk your feet out 18-24 inches (46-61 cm) from it. 2. Place your feet hip-width apart. 3. Slowly slide down the wall or door until your knees bend as far as told by your health care provider. Keep your knees over your heels, not your toes. Keep your knees in line with your hips. 4. Hold for __________ seconds. 5. Push through your heels to stand up to rest for __________ seconds after each repetition. Repeat __________ times. Complete this exercise __________ times a day. Exercise E: Straight leg raises ( hip abductors) 1. Lie on your side, with your left / right leg in the top position. Lie so your head, shoulder, knee, and hip line up with each other. You may bend your bottom knee to help you balance. 2. Lift your top leg 4-6 inches (10-15 cm) while keeping  your toes pointed straight ahead. 3. Hold this position for __________ seconds. 4. Slowly lower your leg to the starting position. Allow your muscles to relax completely after each repetition. Repeat __________ times. Complete this exercise __________ times a day. Exercise F: Straight leg raises ( hip extensors) 1. Lie on your abdomen on a firm surface. You can put a pillow under your hips if that is more comfortable. 2. Tense the muscles in your buttocks and lift your left / right leg about 4-6 inches (10-15 cm). Keep your knee straight as you lift your leg. 3. Hold this position for __________ seconds. 4. Slowly lower your leg to the starting position. 5. Let your leg relax completely after each repetition. Repeat __________ times. Complete this exercise __________ times a day. Exercise G: Bridge ( hip extensors) 1. Lie on your back on a firm surface with your knees bent and your feet flat on the floor. 2. Tighten your buttocks muscles and lift your bottom off the floor until your trunk is level with your thighs. ? Do not arch your back. ? You should feel the muscles working in your buttocks and the back of your thighs. If you do not feel these muscles, slide your feet 1-2 inches (2.5-5 cm) farther away from your buttocks. 3. Hold this position for __________ seconds. 4. Slowly lower your hips to the starting position. 5. Let your buttocks muscles relax completely between repetitions. 6. If this exercise is too easy, try doing it with your arms crossed over your chest. Repeat __________ times. Complete this exercise __________ times a day. This information is not intended to replace advice given to you by your health care provider. Make sure you discuss any questions you have with your health care provider. Document Released: 06/08/2005 Document Revised: 02/13/2016 Document Reviewed: 05/21/2015 Elsevier Interactive Patient Education  [employer].

## 2017-02-08 ENCOUNTER — Ambulatory Visit (INDEPENDENT_AMBULATORY_CARE_PROVIDER_SITE_OTHER): Payer: 59 | Admitting: Family Medicine

## 2017-02-08 ENCOUNTER — Encounter: Payer: Self-pay | Admitting: Family Medicine

## 2017-02-08 VITALS — BP 128/74 | HR 75 | Temp 98.6°F | Ht 60.0 in | Wt 139.8 lb

## 2017-02-08 DIAGNOSIS — M797 Fibromyalgia: Secondary | ICD-10-CM | POA: Diagnosis not present

## 2017-02-08 DIAGNOSIS — M545 Low back pain, unspecified: Secondary | ICD-10-CM

## 2017-02-08 DIAGNOSIS — E782 Mixed hyperlipidemia: Secondary | ICD-10-CM

## 2017-02-08 DIAGNOSIS — M25562 Pain in left knee: Secondary | ICD-10-CM | POA: Diagnosis not present

## 2017-02-08 DIAGNOSIS — E890 Postprocedural hypothyroidism: Secondary | ICD-10-CM

## 2017-02-08 DIAGNOSIS — M25561 Pain in right knee: Secondary | ICD-10-CM | POA: Diagnosis not present

## 2017-02-08 DIAGNOSIS — Z0000 Encounter for general adult medical examination without abnormal findings: Secondary | ICD-10-CM | POA: Diagnosis not present

## 2017-02-08 DIAGNOSIS — G8929 Other chronic pain: Secondary | ICD-10-CM

## 2017-02-08 LAB — COMPREHENSIVE METABOLIC PANEL
ALT: 12 U/L (ref 0–35)
AST: 18 U/L (ref 0–37)
Albumin: 4.2 g/dL (ref 3.5–5.2)
Alkaline Phosphatase: 81 U/L (ref 39–117)
BUN: 12 mg/dL (ref 6–23)
CO2: 34 mEq/L — ABNORMAL HIGH (ref 19–32)
Calcium: 9.9 mg/dL (ref 8.4–10.5)
Chloride: 98 mEq/L (ref 96–112)
Creatinine, Ser: 0.58 mg/dL (ref 0.40–1.20)
GFR: 110.16 mL/min (ref >60.00)
Glucose, Bld: 92 mg/dL (ref 70–99)
Potassium: 3.7 mEq/L (ref 3.5–5.1)
Sodium: 139 mEq/L (ref 135–145)
Total Bilirubin: 0.5 mg/dL (ref 0.2–1.2)
Total Protein: 7.7 g/dL (ref 6.0–8.3)

## 2017-02-08 LAB — LIPID PANEL
Cholesterol: 286 mg/dL — ABNORMAL HIGH (ref 0–200)
HDL: 44.9 mg/dL (ref >39.00)
NonHDL: 240.92
Total CHOL/HDL Ratio: 6
Triglycerides: 253 mg/dL — ABNORMAL HIGH (ref 0.0–149.0)
VLDL: 50.6 mg/dL — ABNORMAL HIGH (ref 0.0–40.0)

## 2017-02-08 LAB — CBC WITH DIFFERENTIAL/PLATELET
Basophils Absolute: 0 10*3/uL (ref 0.0–0.1)
Basophils Relative: 0.8 % (ref 0.0–3.0)
Eosinophils Absolute: 0.2 10*3/uL (ref 0.0–0.7)
Eosinophils Relative: 3.1 % (ref 0.0–5.0)
HCT: 37.5 % (ref 36.0–46.0)
Hemoglobin: 12.7 g/dL (ref 12.0–15.0)
Lymphocytes Relative: 21.8 % (ref 12.0–46.0)
Lymphs Abs: 1.2 10*3/uL (ref 0.7–4.0)
MCHC: 33.9 g/dL (ref 30.0–36.0)
MCV: 101.4 fl — ABNORMAL HIGH (ref 78.0–100.0)
Monocytes Absolute: 0.3 10*3/uL (ref 0.1–1.0)
Monocytes Relative: 6.1 % (ref 3.0–12.0)
Neutro Abs: 3.8 10*3/uL (ref 1.4–7.7)
Neutrophils Relative %: 68.2 % (ref 43.0–77.0)
Platelets: 373 10*3/uL (ref 150.0–400.0)
RBC: 3.7 Mil/uL — ABNORMAL LOW (ref 3.87–5.11)
RDW: 16.2 % — ABNORMAL HIGH (ref 11.5–15.5)
WBC: 5.5 10*3/uL (ref 4.0–10.5)

## 2017-02-08 LAB — T4, FREE: Free T4: 1.13 ng/dL (ref 0.60–1.60)

## 2017-02-08 LAB — HEMOGLOBIN A1C: Hgb A1c MFr Bld: 5.7 % (ref 4.6–6.5)

## 2017-02-08 LAB — LDL CHOLESTEROL, DIRECT: Direct LDL: 186 mg/dL

## 2017-02-08 LAB — TSH: TSH: 2.23 u[IU]/mL (ref 0.35–4.50)

## 2017-02-08 MED ORDER — TRAMADOL HCL 50 MG PO TABS: ORAL_TABLET | ORAL | 2 refills | Status: DC

## 2017-02-08 NOTE — Progress Notes (Signed)
Erika Osborn is a 66 y.o. female is here for follow up.  History of Present Illness:   Water quality scientist, CMA, acting as scribe for Dr. [name].  HPI:  Patient states she is doing well.  States she still has aches and pains from her fibromyalgia.  She is having an MRI of her spine soon.  States she is going to be put under general anesthesia for this because she is severely claustrophobic.  She has provided a form today to be filled out for her insurance.  We will do this for her.  Health Maintenance Due  Topic Date Due  . Hepatitis C Screening  1950-07-30  . COLONOSCOPY  12/15/1999  . DEXA SCAN  12/15/2014  . PNA vac Low Risk Adult (2 of 2 - PPSV23) 10/10/2016  . INFLUENZA VACCINE  01/20/2017   Depression screen [facility] 2/9 08/08/2016 01/24/2016 12/13/2015  Decreased Interest 0 0 0  Down, Depressed, Hopeless 0 0 0  PHQ - 2 Score 0 0 0   PMHx, SurgHx, SocialHx, FamHx, Medications, and Allergies were reviewed in the Visit Navigator and updated as appropriate.   Patient Active Problem List   Diagnosis Date Noted  . Primary osteoarthritis of both hands 01/28/2017  . ANA positive 01/26/2017  . Positive anti-CCP test 11/30/2016  . Chronic bilateral low back pain with bilateral sciatica 11/16/2016  . Osteoarthritis of spine with radiculopathy, lumbar region 11/16/2016  . Polyarthralgia 11/16/2016  . Primary osteoarthritis of both knees 11/16/2016  . Onychomycosis 11/22/2015  . Overweight (BMI 25.0-29.9) 06/25/2015  . 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 Allergic conjunctivitis and rhinitis 01/17/2014  . 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 Fibromyalgia 07/19/2013  . HTN (hypertension) 07/19/2013  . HLD (hyperlipidemia) 07/19/2013  . Ulcerative colitis ([place]) 07/19/2013   Social History  Substance Use Topics  . Smoking status: Never Smoker  . Smokeless tobacco: Never Used  . Alcohol use No   Current Medications and Allergies:   .  azaTHIOprine (IMURAN) 50 MG tablet, Takes 2  tablets once per day., Disp: , Rfl: 5 .  calcium carbonate 1250 MG capsule, Take 1,250 mg by mouth 2 (two) times daily with a meal., Disp: , Rfl:  .  cetirizine (ZYRTEC) 10 MG tablet, Take 10 mg by mouth daily., Disp: , Rfl:  .  cholecalciferol (VITAMIN D) 1000 units tablet, Take 1,000 Units by mouth daily., Disp: , Rfl:  .  fluticasone (FLONASE) 50 MCG/ACT nasal spray, Place 2 sprays into both nostrils daily., Disp: 16 g, Rfl: 6 .  folic acid (FOLVITE) 1 MG tablet, Take 1 mg by mouth daily., Disp: , Rfl:  .  levothyroxine (SYNTHROID, LEVOTHROID) 100 MCG tablet, TAKE 1 TABLET BY MOUTH DAILY, Disp: 90 tablet, Rfl: 3 .  mesalamine (LIALDA) 1.2 g EC tablet, Take 4.8 g by mouth daily with breakfast., Disp: , Rfl:  .  Multiple Vitamins-Minerals (MULTIVITAMIN & MINERAL PO), Take by mouth., Disp: , Rfl:  .  quinapril-hydrochlorothiazide (ACCURETIC) 20-12.5 MG tablet, TAKE 1 TABLET BY MOUTH DAILY, Disp: 90 tablet, Rfl: 2 .  ranitidine (ZANTAC) 150 MG capsule, Take 150 mg by mouth 2 (two) times daily. , Disp: , Rfl:  .  traMADol (ULTRAM) 50 MG tablet, 1-2 TID prn pain, Disp: 240 tablet, Rfl: 2  Allergies  Allergen Reactions  . Humira [Adalimumab] Shortness Of Breath    Body feels like its on fire and turns red  . Remicade [Infliximab] Shortness Of Breath    Body feels like its on fire  and turns red  . Erythromycin Rash  . Latex Rash    Skin redness   . Penicillins Rash    Childhood allergy Has patient had a PCN reaction causing immediate rash, facial/tongue/throat swelling, SOB or lightheadedness with hypotension: Yes Has patient had a PCN reaction causing severe rash involving mucus membranes or skin necrosis: No Has patient had a PCN reaction that required hospitalization: No Has patient had a PCN reaction occurring within the last 10 years: No If all of the above answers are "NO", then may proceed with Cephalosporin use.    Review of Systems   Pertinent items are noted in the HPI.  Otherwise, ROS is negative.  Vitals:   Vitals:   02/08/17 0912  BP: 128/74  Pulse: 75  Temp: 98.6 F (37 C)  TempSrc: Oral  SpO2: 98%  Weight: 139 lb 12.8 oz (63.4 kg)  Height: 5' (1.524 m)     Body mass index is 27.3 kg/m.   Physical Exam:   Physical Exam  Constitutional: She appears well-nourished.  HENT:  Head: Normocephalic and atraumatic.  Eyes: Pupils are equal, round, and reactive to light. EOM are normal.  Neck: Normal range of motion. Neck supple.  Cardiovascular: Normal rate, regular rhythm, normal heart sounds and intact distal pulses.   Pulmonary/Chest: Effort normal.  Abdominal: Soft.  Skin: Skin is warm.  Psychiatric: She has a normal mood and affect. Her behavior is normal.  Nursing note and vitals reviewed.   Results for orders placed or performed in visit on 02/08/17  Lipid panel  Result Value Ref Range   Cholesterol 286 (H) 0 - 200 mg/dL   Triglycerides 253.0 (H) 0.0 - 149.0 mg/dL   HDL 44.90 >39.00 mg/dL   VLDL 50.6 (H) 0.0 - 40.0 mg/dL   Total CHOL/HDL Ratio 6    NonHDL 240.92   Hemoglobin A1c  Result Value Ref Range   Hgb A1c MFr Bld 5.7 4.6 - 6.5 %  TSH  Result Value Ref Range   TSH 2.23 0.35 - 4.50 uIU/mL  T4, free  Result Value Ref Range   Free T4 1.13 0.60 - 1.60 ng/dL  CBC with Differential/Platelet  Result Value Ref Range   WBC 5.5 4.0 - 10.5 K/uL   RBC 3.70 (L) 3.87 - 5.11 Mil/uL   Hemoglobin 12.7 12.0 - 15.0 g/dL   HCT 37.5 36.0 - 46.0 %   MCV 101.4 (H) 78.0 - 100.0 fl   MCHC 33.9 30.0 - 36.0 g/dL   RDW 16.2 (H) 11.5 - 15.5 %   Platelets 373.0 150.0 - 400.0 K/uL   Neutrophils Relative % 68.2 43.0 - 77.0 %   Lymphocytes Relative 21.8 12.0 - 46.0 %   Monocytes Relative 6.1 3.0 - 12.0 %   Eosinophils Relative 3.1 0.0 - 5.0 %   Basophils Relative 0.8 0.0 - 3.0 %   Neutro Abs 3.8 1.4 - 7.7 K/uL   Lymphs Abs 1.2 0.7 - 4.0 K/uL   Monocytes Absolute 0.3 0.1 - 1.0 K/uL   Eosinophils Absolute 0.2 0.0 - 0.7 K/uL   Basophils  Absolute 0.0 0.0 - 0.1 K/uL  Comprehensive metabolic panel  Result Value Ref Range   Sodium 139 135 - 145 mEq/L   Potassium 3.7 3.5 - 5.1 mEq/L   Chloride 98 96 - 112 mEq/L   CO2 34 (H) 19 - 32 mEq/L   Glucose, Bld 92 70 - 99 mg/dL   BUN 12 6 - 23 mg/dL   Creatinine, Ser 0.58  0.40 - 1.20 mg/dL   Total Bilirubin 0.5 0.2 - 1.2 mg/dL   Alkaline Phosphatase 81 39 - 117 U/L   AST 18 0 - 37 U/L   ALT 12 0 - 35 U/L   Total Protein 7.7 6.0 - 8.3 g/dL   Albumin 4.2 3.5 - 5.2 g/dL   Calcium 9.9 8.4 - 10.5 mg/dL   GFR 110.16 >60.00 mL/min  LDL cholesterol, direct  Result Value Ref Range   Direct LDL 186.0 mg/dL   Assessment and Plan:   Erika Osborn was seen today for follow-up.  Diagnoses and all orders for this visit:  Routine health maintenance -     Hemoglobin A1c -     CBC with Differential/Platelet  Fibromyalgia Comments: Okay to continue Tramadol, but we discussed weaning. Other modalities reviewed - exercise, stress reduction, Cymbalta, topical treatments. [employer] fibromyalgia cla Orders: -     traMADol (ULTRAM) 50 MG tablet; 1-2 TID prn pain (Patient taking differently: Take 100 mg by mouth 3 (three) times daily. )  Chronic pain of both knees Comments: OA with signs of advancing disease. I recommend that she see Dr. Paulla Fore for further management. Orders: -     traMADol (ULTRAM) 50 MG tablet; 1-2 TID prn pain (Patient taking differently: Take 100 mg by mouth 3 (three) times daily. )  Chronic midline low back pain without sciatica -     traMADol (ULTRAM) 50 MG tablet; 1-2 TID prn pain (Patient taking differently: Take 100 mg by mouth 3 (three) times daily. )  Mixed hyperlipidemia -     Lipid panel -     Comprehensive metabolic panel -     LDL cholesterol, direct  Hypothyroidism, postradioiodine therapy -     TSH -     T4, free   . Reviewed expectations re: course of current medical issues. . Discussed self-management of symptoms. . Outlined signs and symptoms indicating  need for more acute intervention. . Patient verbalized understanding and all questions were answered. Marland Kitchen Health Maintenance issues including appropriate healthy diet, exercise, and smoking avoidance were discussed with patient. . See orders for this visit as documented in the electronic medical record. . Patient received an After Visit Summary.  CMA served as Education administrator during this visit. History, Physical, and Plan performed by medical provider. The above documentation has been reviewed and is accurate and complete. Briscoe Deutscher, D.O.  Briscoe Deutscher, DO [place], [facility] 02/12/2017  Future Appointments Date Time Provider [place]  03/30/2017 8:00 AM [facility]-MR 2 MC-MRI [facility]  07/09/2017 10:45 AM Bo Merino, MD [place]-[facility] None

## 2017-02-10 ENCOUNTER — Telehealth: Payer: Self-pay | Admitting: Sports Medicine

## 2017-02-10 NOTE — Telephone Encounter (Signed)
Called pt and left VM to call the office.  

## 2017-02-10 NOTE — Telephone Encounter (Signed)
Spoke with patient. She wanted to know if Dr. Paulla Fore could see what he needed with an xray of her back instead of the MRI because her insurance wont cover the anesthesia. I advised that she had xray done 10/2016 and Dr. Paulla Fore ordered the MRI because she was still having back pain. She will have MRI done at [facility] as scheduled.

## 2017-02-10 NOTE — Telephone Encounter (Signed)
Patient has questions about her MRI. Call patient as soon as possible to advise. Call before the end of the day.

## 2017-02-12 ENCOUNTER — Encounter (HOSPITAL_COMMUNITY): Payer: Self-pay | Admitting: *Deleted

## 2017-02-16 ENCOUNTER — Ambulatory Visit (HOSPITAL_COMMUNITY): Payer: 59

## 2017-02-20 ENCOUNTER — Other Ambulatory Visit: Payer: Self-pay | Admitting: Family Medicine

## 2017-03-02 ENCOUNTER — Telehealth: Payer: Self-pay | Admitting: Family Medicine

## 2017-03-02 NOTE — Telephone Encounter (Signed)
FYI

## 2017-03-02 NOTE — Telephone Encounter (Signed)
Please see message and advise 

## 2017-03-02 NOTE — Telephone Encounter (Signed)
Patient is having a lot of gas built up in her system, nausea (no vomiting) diarrhea, and belching. She has Ulcerative colitis.  Can not make it in for an appt today b/c her husband has the car for the day plus [name] is out of the office.  Please advise,  -LL

## 2017-03-02 NOTE — Telephone Encounter (Signed)
Noted.  Inda Coke PA-C 03/02/17

## 2017-03-02 NOTE — Telephone Encounter (Signed)
Please call and triage patient for further evaluation. Please assess for fever, bloody stools, ability to take PO's, abdominal pain, dizziness/lightheadedness, etc.  Additionally -- it appears that she has a GI doctor, Dr. Watt Climes with [facility] -- has she contacted them?  Inda Coke PA-C 03/02/17

## 2017-03-02 NOTE — Telephone Encounter (Signed)
Fever: hot flashes but no fever  Bloody stools: negatives Ability to take PO's: yes, just not much of an appetite. Able to drink fluids okay Abdominal pain: feels like she's going to "pop open" sharpe pain, belching, passing gas but doesn't feel to be "emptying"  Dizziness/lightheadedness: Negative SHOB, chest tightness, chills: Negative  Patient has not been in contact with GI Doctor. She was hoping to hear back from Dr. Alcario Drought office. She does not have transportation and prefers to wait for Dr. [name] tomorrow.   Encouraged patietnt to contact GI doctor today to make aware of symptoms. Patient verbalized understanding. Appointment made tomorrow to see Dr. [name]

## 2017-03-03 ENCOUNTER — Encounter: Payer: Self-pay | Admitting: Family Medicine

## 2017-03-03 ENCOUNTER — Ambulatory Visit (INDEPENDENT_AMBULATORY_CARE_PROVIDER_SITE_OTHER): Payer: 59 | Admitting: Family Medicine

## 2017-03-03 DIAGNOSIS — K5180 Other ulcerative colitis without complications: Secondary | ICD-10-CM | POA: Diagnosis not present

## 2017-03-03 DIAGNOSIS — R1084 Generalized abdominal pain: Secondary | ICD-10-CM | POA: Diagnosis not present

## 2017-03-03 LAB — CBC WITH DIFFERENTIAL/PLATELET
Basophils Absolute: 0 10*3/uL (ref 0.0–0.1)
Basophils Relative: 0.4 % (ref 0.0–3.0)
Eosinophils Absolute: 0.2 10*3/uL (ref 0.0–0.7)
Eosinophils Relative: 2.8 % (ref 0.0–5.0)
HCT: 36 % (ref 36.0–46.0)
Hemoglobin: 12.2 g/dL (ref 12.0–15.0)
Lymphocytes Relative: 24 % (ref 12.0–46.0)
Lymphs Abs: 1.4 10*3/uL (ref 0.7–4.0)
MCHC: 33.8 g/dL (ref 30.0–36.0)
MCV: 100.1 fl — ABNORMAL HIGH (ref 78.0–100.0)
Monocytes Absolute: 0.4 10*3/uL (ref 0.1–1.0)
Monocytes Relative: 6.3 % (ref 3.0–12.0)
Neutro Abs: 3.8 10*3/uL (ref 1.4–7.7)
Neutrophils Relative %: 66.5 % (ref 43.0–77.0)
Platelets: 328 10*3/uL (ref 150.0–400.0)
RBC: 3.59 Mil/uL — ABNORMAL LOW (ref 3.87–5.11)
RDW: 16.1 % — ABNORMAL HIGH (ref 11.5–15.5)
WBC: 5.7 10*3/uL (ref 4.0–10.5)

## 2017-03-03 LAB — COMPREHENSIVE METABOLIC PANEL
ALT: 11 U/L (ref 0–35)
AST: 15 U/L (ref 0–37)
Albumin: 4.4 g/dL (ref 3.5–5.2)
Alkaline Phosphatase: 79 U/L (ref 39–117)
BUN: 9 mg/dL (ref 6–23)
CO2: 33 mEq/L — ABNORMAL HIGH (ref 19–32)
Calcium: 10 mg/dL (ref 8.4–10.5)
Chloride: 95 mEq/L — ABNORMAL LOW (ref 96–112)
Creatinine, Ser: 0.56 mg/dL (ref 0.40–1.20)
GFR: 114.69 mL/min (ref >60.00)
Glucose, Bld: 84 mg/dL (ref 70–99)
Potassium: 3.7 mEq/L (ref 3.5–5.1)
Sodium: 136 mEq/L (ref 135–145)
Total Bilirubin: 0.4 mg/dL (ref 0.2–1.2)
Total Protein: 7.1 g/dL (ref 6.0–8.3)

## 2017-03-03 LAB — URINALYSIS, ROUTINE W REFLEX MICROSCOPIC
Bilirubin Urine: NEGATIVE
Hgb urine dipstick: NEGATIVE
Ketones, ur: NEGATIVE
Leukocytes, UA: NEGATIVE
Nitrite: NEGATIVE
Specific Gravity, Urine: 1.02 (ref 1.000–1.030)
Total Protein, Urine: NEGATIVE
Urine Glucose: NEGATIVE
Urobilinogen, UA: 0.2 (ref 0.0–1.0)
pH: 6.5 (ref 5.0–8.0)

## 2017-03-03 MED ORDER — ONDANSETRON 4 MG PO TBDP: 4.0000 mg | ORAL_TABLET | Freq: Three times a day (TID) | ORAL | 0 refills | Status: DC | PRN

## 2017-03-03 MED ORDER — CIPROFLOXACIN HCL 250 MG PO TABS: 250.0000 mg | ORAL_TABLET | Freq: Two times a day (BID) | ORAL | 0 refills | Status: AC

## 2017-03-03 MED ORDER — CIPROFLOXACIN HCL 250 MG PO TABS: 250.0000 mg | ORAL_TABLET | Freq: Two times a day (BID) | ORAL | 0 refills | Status: DC

## 2017-03-03 NOTE — Progress Notes (Signed)
Erika Osborn is a 66 y.o. female here for an acute visit.  History of Present Illness:   HPI:   Abdominal Pain Patient complains of abdominal pain. The pain is described as colicky, cramping and dull, and is 4/10 in intensity. The patient is experiencing generalized pain. Onset was 3 days ago. Symptoms have been gradually worsening. Aggravating factors: bowel movement and eating.  Alleviating factors: none. Associated symptoms: diarrhea. The patient denies constipation, dysuria, hematochezia, melena and myalgias.  PMHx, SurgHx, SocialHx, Medications, and Allergies were reviewed in the Visit Navigator and updated as appropriate.  Current Medications:   .  acetaminophen (TYLENOL) 650 MG CR tablet, Take 1,300 mg by mouth daily as needed for pain., Disp: , Rfl:  .  azaTHIOprine (IMURAN) 50 MG tablet, Take 136m by mouth daily, Disp: , Rfl: 5 .  Bioflavonoid Products (GRAPE SEED PO), Take 200 mg by mouth daily., Disp: , Rfl:  .  cetirizine (ZYRTEC) 10 MG tablet, Take 10 mg by mouth daily., Disp: , Rfl:  .  Cholecalciferol (VITAMIN D3) 5000 units CAPS, Take 5,000 Units by mouth daily., Disp: , Rfl:  .  fluticasone (FLONASE) 50 MCG/ACT nasal spray, Place 2 sprays into both nostrils daily.  .  folic acid (FOLVITE) 4469MCG tablet, Take 400 mcg by mouth daily., Disp: , Rfl:  .  levothyroxine (SYNTHROID, LEVOTHROID) 100 MCG tablet, TAKE 1 TABLET BY MOUTH DAILY  .  mesalamine (LIALDA) 1.2 g EC tablet, Take 4.8 g by mouth daily with breakfast., Disp: , Rfl:  .  Misc Natural Products (TART CHERRY ADVANCED) CAPS, Take 8066mby mouth once daily, Disp: , Rfl:  .  Multiple Vitamins-Minerals (MULTIVITAMIN & MINERAL PO), Take 1 tablet by mouth daily. , Disp: , Rfl:  .  quinapril-hydrochlorothiazide (ACCURETIC) 20-12.5 MG tablet, TAKE 1 TABLET BY MOUTH DAILY, Disp: 90 tablet, Rfl: 0 .  ranitidine (ZANTAC) 150 MG capsule, Take 150 mg by mouth 2 (two) times daily. , Disp: , Rfl:  .  Soft Lens Products  (OPTI-FREE PUREMOIST REWETTING) SOLN, 1 drop by Does not apply route daily as needed (dry eyes)., Disp: , Rfl:  .  traMADol (ULTRAM) 50 MG tablet, 1-2 TID prn pain (Patient taking differently: Take 100 mg by mouth 3 (three) times daily. ), Disp: 240 tablet, Rfl: 2 .  TURMERIC PO, Take 2 capsules by mouth daily. With ginger, Disp: , Rfl:    Allergies  Allergen Reactions  . Humira [Adalimumab] Shortness Of Breath    Body feels like its on fire and turns red  . Remicade [Infliximab] Shortness Of Breath    Body feels like its on fire and turns red  . Erythromycin Rash  . Latex Rash    Skin redness   . Penicillins Rash   Review of Systems:   Pertinent items are noted in the HPI. Otherwise, ROS is negative.  Vitals:  There were no vitals filed for this visit.   There is no height or weight on file to calculate BMI. Physical Exam:   Physical Exam  Constitutional: Erika Osborn appears well-nourished.  HENT:  Head: Normocephalic and atraumatic.  Eyes: Pupils are equal, round, and reactive to light. EOM are normal.  Neck: Normal range of motion. Neck supple.  Cardiovascular: Normal rate, regular rhythm, normal heart sounds and intact distal pulses.   Pulmonary/Chest: Effort normal and breath sounds normal.  Abdominal: Soft. Bowel sounds are normal. Erika Osborn exhibits no distension, no fluid wave and no mass. There is generalized tenderness. There is  no rebound and no guarding. No hernia.  Skin: Skin is warm.  Psychiatric: Erika Osborn has a normal mood and affect. Her behavior is normal.  Nursing note and vitals reviewed.  Results for orders placed or performed in visit on 03/03/17  CBC with Differential/Platelet  Result Value Ref Range   WBC 5.7 4.0 - 10.5 K/uL   RBC 3.59 (L) 3.87 - 5.11 Mil/uL   Hemoglobin 12.2 12.0 - 15.0 g/dL   HCT 36.0 36.0 - 46.0 %   MCV 100.1 (H) 78.0 - 100.0 fl   MCHC 33.8 30.0 - 36.0 g/dL   RDW 16.1 (H) 11.5 - 15.5 %   Platelets 328.0 150.0 - 400.0 K/uL   Neutrophils Relative  % 66.5 43.0 - 77.0 %   Lymphocytes Relative 24.0 12.0 - 46.0 %   Monocytes Relative 6.3 3.0 - 12.0 %   Eosinophils Relative 2.8 0.0 - 5.0 %   Basophils Relative 0.4 0.0 - 3.0 %   Neutro Abs 3.8 1.4 - 7.7 K/uL   Lymphs Abs 1.4 0.7 - 4.0 K/uL   Monocytes Absolute 0.4 0.1 - 1.0 K/uL   Eosinophils Absolute 0.2 0.0 - 0.7 K/uL   Basophils Absolute 0.0 0.0 - 0.1 K/uL  Comprehensive metabolic panel  Result Value Ref Range   Sodium 136 135 - 145 mEq/L   Potassium 3.7 3.5 - 5.1 mEq/L   Chloride 95 (L) 96 - 112 mEq/L   CO2 33 (H) 19 - 32 mEq/L   Glucose, Bld 84 70 - 99 mg/dL   BUN 9 6 - 23 mg/dL   Creatinine, Ser 0.56 0.40 - 1.20 mg/dL   Total Bilirubin 0.4 0.2 - 1.2 mg/dL   Alkaline Phosphatase 79 39 - 117 U/L   AST 15 0 - 37 U/L   ALT 11 0 - 35 U/L   Total Protein 7.1 6.0 - 8.3 g/dL   Albumin 4.4 3.5 - 5.2 g/dL   Calcium 10.0 8.4 - 10.5 mg/dL   GFR 114.69 >60.00 mL/min  Urinalysis, Routine w reflex microscopic  Result Value Ref Range   Color, Urine YELLOW Yellow;Lt. Yellow   APPearance CLEAR Clear   Specific Gravity, Urine 1.020 1.000 - 1.030   pH 6.5 5.0 - 8.0   Total Protein, Urine NEGATIVE Negative   Urine Glucose NEGATIVE Negative   Ketones, ur NEGATIVE Negative   Bilirubin Urine NEGATIVE Negative   Hgb urine dipstick NEGATIVE Negative   Urobilinogen, UA 0.2 0.0 - 1.0   Leukocytes, UA NEGATIVE Negative   Nitrite NEGATIVE Negative   WBC, UA 0-2/hpf 0-2/hpf   RBC / HPF 0-2/hpf 0-2/hpf   Mucus, UA Presence of (A) None   Assessment and Plan:   Erika Osborn was seen today for abdominal pain.  Diagnoses and all orders for this visit:  Generalized abdominal pain Comments: No red flags. Pain with palpation focused in areas of colon.  Orders: -     CBC with Differential/Platelet -     Comprehensive metabolic panel -     Urinalysis, Routine w reflex microscopic -     ciprofloxacin (CIPRO) 250 MG tablet; Take 1 tablet (250 mg total) by mouth 2 (two) times daily. -      ondansetron (ZOFRAN ODT) 4 MG disintegrating tablet; Take 1 tablet (4 mg total) by mouth every 8 (eight) hours as needed for nausea or vomiting.  Other ulcerative colitis without complication ([place]) Comments: The patient DOES NOT feel that this is a UC flare.    . Reviewed expectations  re: course of current medical issues. . Discussed self-management of symptoms. . Outlined signs and symptoms indicating need for more acute intervention. . Patient verbalized understanding and all questions were answered. Marland Kitchen Health Maintenance issues including appropriate healthy diet, exercise, and smoking avoidance were discussed with patient. . See orders for this visit as documented in the electronic medical record. . Patient received an After Visit Summary.  Briscoe Deutscher, DO [place], [facility] 03/07/2017  Future Appointments Date Time Provider [place]  03/30/2017 8:00 AM MC-MR 2 MC-MRI [facility]  07/09/2017 10:45 AM Bo Merino, MD [facility] None

## 2017-03-25 ENCOUNTER — Telehealth: Payer: Self-pay

## 2017-03-25 NOTE — Telephone Encounter (Signed)
Pt coming for labs 03/29/17. Please place future orders.  Thank you.

## 2017-03-26 ENCOUNTER — Encounter (HOSPITAL_COMMUNITY): Payer: Self-pay | Admitting: *Deleted

## 2017-03-26 NOTE — Progress Notes (Signed)
Pt denies SOB, chest pain, and being under the care of a cardiologist. Pt denies having a stress test, echo and cardiac cath. Pt denies having an EKG and chest x ray within the last year. Pt made aware to stop taking vitamins and herbal medications such as Cherry Tart, Grape Seed, and Turmeric. Pt verbalized understanding of all pre-op instructions.

## 2017-03-26 NOTE — Telephone Encounter (Signed)
Please advise 

## 2017-03-28 ENCOUNTER — Other Ambulatory Visit: Payer: Self-pay | Admitting: Family Medicine

## 2017-03-28 DIAGNOSIS — R718 Other abnormality of red blood cells: Secondary | ICD-10-CM | POA: Insufficient documentation

## 2017-03-28 DIAGNOSIS — R945 Abnormal results of liver function studies: Secondary | ICD-10-CM

## 2017-03-28 DIAGNOSIS — R7989 Other specified abnormal findings of blood chemistry: Secondary | ICD-10-CM

## 2017-03-28 NOTE — Telephone Encounter (Signed)
CBC, CMP, and B12 added.

## 2017-03-29 ENCOUNTER — Other Ambulatory Visit (INDEPENDENT_AMBULATORY_CARE_PROVIDER_SITE_OTHER): Payer: 59

## 2017-03-29 ENCOUNTER — Ambulatory Visit (INDEPENDENT_AMBULATORY_CARE_PROVIDER_SITE_OTHER): Payer: 59

## 2017-03-29 DIAGNOSIS — Z23 Encounter for immunization: Secondary | ICD-10-CM | POA: Diagnosis not present

## 2017-03-29 DIAGNOSIS — R945 Abnormal results of liver function studies: Secondary | ICD-10-CM

## 2017-03-29 DIAGNOSIS — R718 Other abnormality of red blood cells: Secondary | ICD-10-CM | POA: Diagnosis not present

## 2017-03-29 DIAGNOSIS — R7989 Other specified abnormal findings of blood chemistry: Secondary | ICD-10-CM

## 2017-03-29 LAB — CBC WITH DIFFERENTIAL/PLATELET
Basophils Absolute: 0 10*3/uL (ref 0.0–0.1)
Basophils Relative: 1 % (ref 0.0–3.0)
Eosinophils Absolute: 0.2 10*3/uL (ref 0.0–0.7)
Eosinophils Relative: 4.7 % (ref 0.0–5.0)
HCT: 36 % (ref 36.0–46.0)
Hemoglobin: 12.2 g/dL (ref 12.0–15.0)
Lymphocytes Relative: 32.2 % (ref 12.0–46.0)
Lymphs Abs: 1.5 10*3/uL (ref 0.7–4.0)
MCHC: 33.9 g/dL (ref 30.0–36.0)
MCV: 100 fl (ref 78.0–100.0)
Monocytes Absolute: 0.4 10*3/uL (ref 0.1–1.0)
Monocytes Relative: 7.9 % (ref 3.0–12.0)
Neutro Abs: 2.5 10*3/uL (ref 1.4–7.7)
Neutrophils Relative %: 54.2 % (ref 43.0–77.0)
Platelets: 323 10*3/uL (ref 150.0–400.0)
RBC: 3.6 Mil/uL — ABNORMAL LOW (ref 3.87–5.11)
RDW: 15.9 % — ABNORMAL HIGH (ref 11.5–15.5)
WBC: 4.7 10*3/uL (ref 4.0–10.5)

## 2017-03-29 LAB — COMPREHENSIVE METABOLIC PANEL
ALT: 12 U/L (ref 0–35)
AST: 15 U/L (ref 0–37)
Albumin: 4.3 g/dL (ref 3.5–5.2)
Alkaline Phosphatase: 79 U/L (ref 39–117)
BUN: 12 mg/dL (ref 6–23)
CO2: 33 mEq/L — ABNORMAL HIGH (ref 19–32)
Calcium: 9.7 mg/dL (ref 8.4–10.5)
Chloride: 98 mEq/L (ref 96–112)
Creatinine, Ser: 0.56 mg/dL (ref 0.40–1.20)
GFR: 114.67 mL/min (ref >60.00)
Glucose, Bld: 87 mg/dL (ref 70–99)
Potassium: 3.4 mEq/L — ABNORMAL LOW (ref 3.5–5.1)
Sodium: 138 mEq/L (ref 135–145)
Total Bilirubin: 0.5 mg/dL (ref 0.2–1.2)
Total Protein: 7.3 g/dL (ref 6.0–8.3)

## 2017-03-29 LAB — VITAMIN B12: Vitamin B-12: 443 pg/mL (ref 211–911)

## 2017-03-29 NOTE — Anesthesia Preprocedure Evaluation (Signed)
Anesthesia Evaluation  Patient identified by MRN, date of birth, ID band Patient awake    Reviewed: Allergy & Precautions, H&P , Patient's Chart, lab work & pertinent test results, reviewed documented beta blocker date and time   Airway Mallampati: II  TM Distance: >3 FB Neck ROM: full    Dental no notable dental hx.    Pulmonary    Pulmonary exam normal breath sounds clear to auscultation       Cardiovascular hypertension,  Rhythm:regular Rate:Normal     Neuro/Psych    GI/Hepatic   Endo/Other    Renal/GU      Musculoskeletal   Abdominal   Peds  Hematology   Anesthesia Other Findings   Reproductive/Obstetrics                             Anesthesia Physical Anesthesia Plan  ASA: II  Anesthesia Plan: General   Post-op Pain Management:    Induction: Intravenous  PONV Risk Score and Plan: 2 and Ondansetron and Dexamethasone  Airway Management Planned: Oral ETT  Additional Equipment:   Intra-op Plan:   Post-operative Plan: Extubation in OR  Informed Consent: I have reviewed the patients History and Physical, chart, labs and discussed the procedure including the risks, benefits and alternatives for the proposed anesthesia with the patient or authorized representative who has indicated his/her understanding and acceptance.   Dental Advisory Given  Plan Discussed with: CRNA and Surgeon  Anesthesia Plan Comments: (  )        Anesthesia Quick Evaluation

## 2017-03-30 ENCOUNTER — Ambulatory Visit (HOSPITAL_COMMUNITY)
Admission: RE | Admit: 2017-03-30 | Discharge: 2017-03-30 | Disposition: A | Discharge status: Home / Self Care | Payer: 59 | Source: Ambulatory Visit | Attending: Sports Medicine | Admitting: Sports Medicine

## 2017-03-30 ENCOUNTER — Ambulatory Visit (HOSPITAL_COMMUNITY): Payer: 59 | Admitting: Certified Registered Nurse Anesthetist

## 2017-03-30 ENCOUNTER — Encounter (HOSPITAL_COMMUNITY): Payer: Self-pay | Admitting: *Deleted

## 2017-03-30 ENCOUNTER — Encounter (HOSPITAL_COMMUNITY)
Admission: RE | Disposition: A | Discharge status: Home / Self Care | Payer: Self-pay | Source: Ambulatory Visit | Attending: Sports Medicine

## 2017-03-30 DIAGNOSIS — I10 Essential (primary) hypertension: Secondary | ICD-10-CM | POA: Insufficient documentation

## 2017-03-30 DIAGNOSIS — M5442 Lumbago with sciatica, left side: Secondary | ICD-10-CM | POA: Diagnosis not present

## 2017-03-30 DIAGNOSIS — M47812 Spondylosis without myelopathy or radiculopathy, cervical region: Secondary | ICD-10-CM | POA: Insufficient documentation

## 2017-03-30 DIAGNOSIS — M4316 Spondylolisthesis, lumbar region: Secondary | ICD-10-CM | POA: Insufficient documentation

## 2017-03-30 DIAGNOSIS — G8929 Other chronic pain: Secondary | ICD-10-CM

## 2017-03-30 DIAGNOSIS — M5441 Lumbago with sciatica, right side: Secondary | ICD-10-CM | POA: Insufficient documentation

## 2017-03-30 DIAGNOSIS — M48061 Spinal stenosis, lumbar region without neurogenic claudication: Secondary | ICD-10-CM | POA: Insufficient documentation

## 2017-03-30 DIAGNOSIS — M5416 Radiculopathy, lumbar region: Secondary | ICD-10-CM | POA: Diagnosis not present

## 2017-03-30 HISTORY — DX: Adverse effect of unspecified anesthetic, initial encounter: T41.45XA

## 2017-03-30 HISTORY — DX: Other complications of anesthesia, initial encounter: T88.59XA

## 2017-03-30 HISTORY — DX: Other chronic pain: G89.29

## 2017-03-30 HISTORY — DX: Major depressive disorder, single episode, unspecified: F32.9

## 2017-03-30 HISTORY — DX: Depression, unspecified: F32.A

## 2017-03-30 HISTORY — DX: Headache: R51

## 2017-03-30 HISTORY — PX: RADIOLOGY WITH ANESTHESIA: SHX6223

## 2017-03-30 HISTORY — DX: Dorsalgia, unspecified: M54.9

## 2017-03-30 HISTORY — DX: Headache, unspecified: R51.9

## 2017-03-30 HISTORY — DX: Pneumonia, unspecified organism: J18.9

## 2017-03-30 SURGERY — RADIOLOGY WITH ANESTHESIA
Anesthesia: General

## 2017-03-30 MED ORDER — LACTATED RINGERS IV SOLN
INTRAVENOUS | Status: DC
  Administered 2017-03-30: 07:00:00 via INTRAVENOUS

## 2017-03-30 NOTE — Transfer of Care (Signed)
Immediate Anesthesia Transfer of Care Note  Patient: Erika Osborn  Procedure(s) Performed: RADIOLOGY WITH ANESTHESIA-LUMBAR SPINE WITHOUT CONTRAST (N/A )  Patient Location: PACU  Anesthesia Type:General  Level of Consciousness: awake, patient cooperative and responds to stimulation  Airway & Oxygen Therapy: Patient Spontanous Breathing  Post-op Assessment: Report given to RN and Post -op Vital signs reviewed and stable  Post vital signs: Reviewed and stable  Last Vitals:  Vitals:   03/30/17 0930 03/30/17 0931  BP:  (!) 150/64  Pulse: 82 79  Resp: (!) 23 10  Temp:    SpO2: 99% 100%    Last Pain:  Vitals:   03/30/17 0641  TempSrc:   PainSc: 5       Patients Stated Pain Goal: 4 (66/29/47 6546)  Complications: No apparent anesthesia complications

## 2017-03-30 NOTE — Anesthesia Postprocedure Evaluation (Signed)
Anesthesia Post Note  Patient: Charonda Hefter  Procedure(s) Performed: RADIOLOGY WITH ANESTHESIA-LUMBAR SPINE WITHOUT CONTRAST (N/A )     Patient location during evaluation: PACU Anesthesia Type: General Level of consciousness: awake and alert Pain management: pain level controlled Vital Signs Assessment: post-procedure vital signs reviewed and stable Respiratory status: spontaneous breathing, nonlabored ventilation, respiratory function stable and patient connected to nasal cannula oxygen Cardiovascular status: blood pressure returned to baseline and stable Postop Assessment: no apparent nausea or vomiting Anesthetic complications: no    Last Vitals:  Vitals:   03/30/17 1045 03/30/17 1100  BP: (!) 130/93 (!) 153/76  Pulse: 71 78  Resp: 15 15  Temp:  36.7 C  SpO2: 98% 100%    Last Pain:  Vitals:   03/30/17 1100  TempSrc:   PainSc: 0-No pain                 Ovila Lepage EDWARD

## 2017-03-31 ENCOUNTER — Encounter (HOSPITAL_COMMUNITY): Payer: Self-pay | Admitting: Radiology

## 2017-03-31 MED FILL — Ephedrine Sulf-NaCl Soln Pref Syr 50 MG/10ML-0.9% (5 MG/ML): INTRAVENOUS | Qty: 40 | Status: AC

## 2017-03-31 MED FILL — Fentanyl Citrate Preservative Free (PF) Inj 100 MCG/2ML: INTRAMUSCULAR | Qty: 2 | Status: AC

## 2017-03-31 MED FILL — Lidocaine HCl IV Inj 20 MG/ML: INTRAVENOUS | Qty: 5 | Status: AC

## 2017-03-31 MED FILL — Lactated Ringer's Solution: INTRAVENOUS | Qty: 1000 | Status: AC

## 2017-03-31 MED FILL — Midazolam HCl Inj 2 MG/2ML (Base Equivalent): INTRAMUSCULAR | Qty: 2 | Status: AC

## 2017-03-31 MED FILL — Propofol IV Emul 200 MG/20ML (10 MG/ML): INTRAVENOUS | Qty: 20 | Status: AC

## 2017-04-05 ENCOUNTER — Ambulatory Visit (INDEPENDENT_AMBULATORY_CARE_PROVIDER_SITE_OTHER): Payer: 59 | Admitting: Sports Medicine

## 2017-04-05 ENCOUNTER — Encounter: Payer: Self-pay | Admitting: Sports Medicine

## 2017-04-05 VITALS — BP 136/88 | HR 88 | Ht 60.0 in | Wt 136.6 lb

## 2017-04-05 DIAGNOSIS — M797 Fibromyalgia: Secondary | ICD-10-CM | POA: Diagnosis not present

## 2017-04-05 DIAGNOSIS — M4726 Other spondylosis with radiculopathy, lumbar region: Secondary | ICD-10-CM | POA: Diagnosis not present

## 2017-04-05 DIAGNOSIS — M2550 Pain in unspecified joint: Secondary | ICD-10-CM

## 2017-04-05 DIAGNOSIS — M170 Bilateral primary osteoarthritis of knee: Secondary | ICD-10-CM

## 2017-04-05 MED ORDER — GABAPENTIN 300 MG PO CAPS: ORAL_CAPSULE | ORAL | 1 refills | Status: DC

## 2017-04-05 NOTE — Patient Instructions (Signed)
It was good to see you.  Try the gabapentin at nighttime for 1 week then increase to twice per day.  Dr. Romona Curls office will be in touch to schedule an appointment, be sure to take your Valium 1 hour before hand and you will need a driver for this appointment.

## 2017-04-05 NOTE — Assessment & Plan Note (Signed)
Gabapentin added today.

## 2017-04-05 NOTE — Assessment & Plan Note (Signed)
Being followed by Dr. Dimas Alexandria.

## 2017-04-05 NOTE — Assessment & Plan Note (Signed)
Multilevel degenerative changes with fairly severe scoliosis.  She has marked changes at L5-S1 she reports having had a fusion in this area but does not does not appear that this is the case and likely more so a laminectomy.  Patient would likely benefit from a right-sided L5-S1 ESI with Dr. Ernestina Patches and referral has been placed today.  Will likely need to consider surgical intervention at some time in the near future and will discuss this at her follow-up appointment in 8 weeks.

## 2017-04-05 NOTE — Progress Notes (Signed)
OFFICE VISIT NOTE Erika Osborn, [place] at [facility] [phone]  Rennie Hack - 66 y.o. female MRN [id number]  Date of birth: 30-Jun-1950  Visit Date: 04/05/2017  PCP: Erika Deutscher, DO   Referred by: Erika Deutscher, DO  Burlene Arnt, CMA acting as scribe for Dr. Paulla Fore.  SUBJECTIVE:   Chief Complaint  Patient presents with  . Follow-up    bursitis   HPI: As below and per problem based documentation when appropriate.  Erika Osborn is an established patient presenting today for evaluation of RT hip pain. She reports that pain has worsened since she was last seen. Pain is worse after doing a lot of walking. The pain is constant and radiates into the RT leg. She has noticed some weakness in the RT leg. She also c/o low back pain. She had MRI L-spine 03/30/17 and results will be discussed today.     ROS  Otherwise per HPI.  HISTORY & PERTINENT PRIOR DATA:  **colonoscopy performed, just need record request completed - please address at next OV** She reports that she has never smoked. She has never used smokeless tobacco.   Recent Labs  10/21/16 1051 02/08/17 0956  HGBA1C  --  5.7  LABURIC 4.5  --    Allergies reviewed per EMR Prior to Admission medications   Medication Sig Start Date End Date Taking? Authorizing Provider  acetaminophen (TYLENOL) 650 MG CR tablet Take 1,300 mg by mouth daily as needed for pain.   Yes [provider]  azaTHIOprine (IMURAN) 50 MG tablet Take 160m by mouth daily 08/21/16  Yes [provider]  Bioflavonoid Products (GRAPE SEED PO) Take 200 mg by mouth daily.   Yes [provider]  cetirizine (ZYRTEC) 10 MG tablet Take 10 mg by mouth daily.   Yes [provider]  Cholecalciferol (VITAMIN D3) 5000 units CAPS Take 5,000 Units by mouth daily.   Yes [provider]  fluticasone (FLONASE) 50 MCG/ACT nasal spray Place 2 sprays into both nostrils daily. Patient  taking differently: Place 2 sprays into both nostrils daily as needed for allergies.  05/25/16  Yes GSmiley Houseman MD  folic acid (FOLVITE) 4244MCG tablet Take 400 mcg by mouth daily.   Yes [provider]  levothyroxine (SYNTHROID, LEVOTHROID) 100 MCG tablet TAKE 1 TABLET BY MOUTH DAILY Patient taking differently: TAKE 1 TABLET BY MOUTH DAILY AT BEDTIME 08/21/16  Yes WBriscoe Deutscher DO  mesalamine (LIALDA) 1.2 g EC tablet Take 4.8 g by mouth daily with breakfast.   Yes [provider]  Misc Natural Products (TART CHERRY ADVANCED) CAPS Take 8021mby mouth once daily   Yes [provider]  Multiple Vitamins-Minerals (MULTIVITAMIN & MINERAL PO) Take 1 tablet by mouth daily.    Yes [provider]  ondansetron (ZOFRAN ODT) 4 MG disintegrating tablet Take 1 tablet (4 mg total) by mouth every 8 (eight) hours as needed for nausea or vomiting. 03/03/17  Yes WaBriscoe DeutscherDO  quinapril-hydrochlorothiazide (ACCURETIC) 20-12.5 MG tablet TAKE 1 TABLET BY MOUTH DAILY 02/23/17  Yes WaBriscoe DeutscherDO  ranitidine (ZANTAC) 150 MG capsule Take 150 mg by mouth 2 (two) times daily.    Yes [provider]  Soft Lens Products (OPTI-FREE PUREMOIST REWETTING) SOLN 1 drop by Does not apply route daily as needed (dry eyes).   Yes [provider]  traMADol (ULTRAM) 50 MG tablet 1-2 TID prn pain Patient taking differently: Take 100  mg by mouth 3 (three) times daily.  02/08/17  Yes Erika Deutscher, DO  TURMERIC PO Take 2 capsules by mouth daily. With ginger   Yes [provider]  gabapentin (NEURONTIN) 300 MG capsule Start with 1 tab po qhs X 1 week, then increase to 1 tab po bid X 1 week then 1 tab po tid prn 04/05/17   [name], DO   Patient Active Problem List   Diagnosis Date Noted  . Elevated MCV 03/28/2017  . Elevated LFTs 03/28/2017  . Primary osteoarthritis of both hands 01/28/2017  . ANA positive 01/26/2017  . Positive anti-CCP test  11/30/2016  . Chronic bilateral low back pain with bilateral sciatica 11/16/2016  . Osteoarthritis of spine with radiculopathy, lumbar region 11/16/2016  . Polyarthralgia 11/16/2016  . Primary osteoarthritis of both knees 11/16/2016  . Onychomycosis 11/22/2015  . Overweight (BMI 25.0-29.9) 06/25/2015  . 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 Allergic conjunctivitis and rhinitis 01/17/2014  . 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 Fibromyalgia 07/19/2013  . HTN (hypertension) 07/19/2013  . HLD (hyperlipidemia) 07/19/2013  . Ulcerative colitis ([place]) 07/19/2013   Past Medical History:  Diagnosis Date  . Arthritis   . Chronic back pain   . Complication of anesthesia    increase blood pressure for not taking meds  . Depression   . Fibromyalgia   . GERD (gastroesophageal reflux disease)   . Headache   . HLD (hyperlipidemia)   . HTN (hypertension)   . Hypothyroidism following radioiodine therapy   . Pneumonia    h/o pneumonia yrs. ago  . Ulcerative colitis ([place]) 1976   Family History  Problem Relation Age of Onset  . Cancer Father   . Heart failure Mother   . Hyperlipidemia Mother   . Osteoporosis Mother   . Hyperlipidemia Brother   . Hyperlipidemia Sister    Past Surgical History:  Procedure Laterality Date  . APPENDECTOMY    . BREAST REDUCTION SURGERY    . BREAST REDUCTION SURGERY Bilateral 06/22/2005  . COLONOSCOPY W/ BIOPSIES AND POLYPECTOMY    . LUMBAR DISC SURGERY    . RADIOLOGY WITH ANESTHESIA N/A 03/30/2017   Procedure: RADIOLOGY WITH ANESTHESIA-LUMBAR SPINE WITHOUT CONTRAST;  Surgeon: Radiologist, Medication, MD;  Location: [facility];  Service: Radiology;  Laterality: N/A;  . TONSILLECTOMY    . UTERINE SUSPENSION    . WISDOM TOOTH EXTRACTION     Social History   Occupational History  . Unemployed    Social History Main Topics  . Smoking status: Never Smoker  . Smokeless tobacco: Never Used  . Alcohol use No  . Drug use: No  . Sexual activity: Yes     Partners: Male     Comment: Married.    OBJECTIVE:  VS:  HT:5' (152.4 cm)   WT:136 lb 9.6 oz (62 kg)  BMI:26.68    BP:136/88  HR:88bpm  TEMP: ( )  RESP:96 % EXAM: Findings:  Adult female.  No acute distress.  Alert and appropriate.  She has no pain with straight leg raise.  Mild pain with slump test.  Pain within the low lumbar spine but this is mild.  No significant lower extremity edema.  Knees have only small effusions.    RADIOLOGY: MR Lumbar Spine Wo Contrast CLINICAL DATA:  Chronic bilateral low back pain. Bilateral sciatica.  EXAM: MRI LUMBAR SPINE WITHOUT CONTRAST  TECHNIQUE: Multiplanar, multisequence MR imaging of the lumbar spine was performed. No intravenous contrast was administered.  COMPARISON:  Two-view lumbar spine  radiographs 10/21/2016  FINDINGS: Segmentation: 5 non rib-bearing lumbar type vertebral bodies are present.  Alignment: Grade 1 anterolisthesis at L5-S1 measures 8 mm. AP alignment is otherwise anatomic. Rightward curvature is centered at L2-3.  Vertebrae:  Marrow signal and vertebral body heights are normal.  Conus medullaris: Extends to the L1 level and appears normal.  Paraspinal and other soft tissues: Bilateral subcentimeter renal cysts are present. No other solid organ lesion is present. There is no significant adenopathy. Paraspinous musculature is within normal limits.  Disc levels:  T12-L1: Facet hypertrophy is worse on the left. There is no significant stenosis.  L1-2: Asymmetric left-sided facet hypertrophy and and disc bulging is present. Mild left subarticular narrowing results. Mild left foraminal narrowing is present.  L2-3: A leftward disc protrusion is present. Asymmetric left-sided facet hypertrophy is noted. Mild left subarticular and foraminal narrowing is present.  L3-4: A leftward disc protrusion is present. Asymmetric facet hypertrophy is present. Mild left subarticular and moderate left foraminal  stenosis is present. Facet hypertrophy contributes to mild right foraminal narrowing.  L4-5: The broad-based disc protrusion is asymmetric to the right. Facet hypertrophy is worse on the right. Mild right subarticular and moderate right foraminal stenosis is present. Mild left foraminal narrowing is noted.  L5-S1: Bilateral pars defects are present. There is uncovering of a broad-based disc protrusion. The central canal is patent. Moderate to severe foraminal stenosis is present bilaterally.  IMPRESSION: 1. Multilevel spondylosis of the cervical spine with moderate dextroconvex scoliosis. 2. Mild left subarticular and foraminal narrowing at L1-2 and L2-3. 3. Mild left subarticular moderate left foraminal stenosis at L3-4. 4. Mild right subarticular and moderate right foraminal stenosis at L4-5. 5. 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 6.  Electronically Signed   By: San Morelle M.D.   On: 03/30/2017 10:10  ASSESSMENT & PLAN:     ICD-10-CM   1. Fibromyalgia M79.7   2. Osteoarthritis of spine with radiculopathy, lumbar region M47.26 Ambulatory referral to Physical Medicine Rehab    gabapentin (NEURONTIN) 300 MG capsule  3. Polyarthralgia M25.50   4. Primary osteoarthritis of both knees M17.0    ================================================================= Primary osteoarthritis of both knees Overall she is doing better with her knee pain, wearing a Body Helix when she is active.  Consider repeat injections in the future.  Osteoarthritis of spine with radiculopathy, lumbar region Multilevel degenerative changes with fairly severe scoliosis.  She has marked changes at L5-S1 she reports having had a fusion in this area but does not does not appear that this is the case and likely more so a laminectomy.  Patient would likely benefit from a right-sided L5-S1 ESI  with Dr. Ernestina Patches and referral has been placed today.  Will likely need to consider surgical intervention at some time in the near future and will discuss this at her follow-up appointment in 8 weeks.  Fibromyalgia Gabapentin added today.  Polyarthralgia Being followed by Dr. Dimas Alexandria.  No notes on file ================================================================= Patient Instructions  It was good to see you.  Try the gabapentin at nighttime for 1 week then increase to twice per day.  Dr. Romona Curls office will be in touch to schedule an appointment, be sure to take your Valium 1 hour before hand and you will need a driver for this appointment.  =================================================================   Follow-up: Return in about 8 weeks (around 05/31/2017).   CMA/ATC served as Education administrator during this visit.  History, Physical, and Plan performed by medical provider. Documentation and orders reviewed and attested to.      Teresa Coombs, [facility] Physician

## 2017-04-05 NOTE — Assessment & Plan Note (Signed)
Overall she is doing better with her knee pain, wearing a Body Helix when she is active.  Consider repeat injections in the future.

## 2017-04-06 ENCOUNTER — Telehealth: Payer: Self-pay | Admitting: Family Medicine

## 2017-04-06 NOTE — Telephone Encounter (Signed)
Forwarding to Dr. Paulla Fore to advise.

## 2017-04-06 NOTE — Telephone Encounter (Signed)
Called pt and advised and she verbalized understanding.

## 2017-04-06 NOTE — Telephone Encounter (Signed)
Okay to take gabapentin 1 hour prior to bedtime and levothyroxine on an empty stomach at bedtime.

## 2017-04-06 NOTE — Telephone Encounter (Signed)
Patient needs clarification on when to take gabapentin. Patient was originally told to take it at night. Dr. [name] changed when to take her levoxthyroxine and was instructed to take it at bedtime without any other meds. Please call with directions on how she should take gabapentin.

## 2017-04-07 NOTE — Telephone Encounter (Signed)
Pt called back to advise that she took her Gabapentin 1 hour prior to bedtime and she slept an hour over her normal time and almost missed breakfast for her daughter. Its making her feel very foggy and dopey. She has concerns about taking this medication d/t the side effects. She wants to know what she should do, if there is an alternative medication or a lower dose. She says that she just could not function today and she has to drive her other daughter to [facility]. She says that she will not be taking the medication tonight.

## 2017-04-15 ENCOUNTER — Other Ambulatory Visit: Payer: Self-pay

## 2017-04-15 NOTE — Telephone Encounter (Signed)
It is okay if she has discontinued this. We can try a 100 mg dose if she is interested however this may cause continued sedation as well.  If she would like additionally we can try Lyrica but this may cause adverse effects as well.  At this time I recommend discontinuing it altogether and seeing how she does with referral to physiatry.

## 2017-04-15 NOTE — Telephone Encounter (Signed)
Spoke with patient and she would like to hold off on referral for now. She has an appointment scheduled for an injection with Dr. Ernestina Patches and she will call back if she doesn't get any relief after the injection.

## 2017-04-19 ENCOUNTER — Encounter (INDEPENDENT_AMBULATORY_CARE_PROVIDER_SITE_OTHER): Payer: Self-pay | Admitting: Physical Medicine and Rehabilitation

## 2017-04-19 ENCOUNTER — Ambulatory Visit (INDEPENDENT_AMBULATORY_CARE_PROVIDER_SITE_OTHER): Payer: 59 | Admitting: Physical Medicine and Rehabilitation

## 2017-04-19 ENCOUNTER — Ambulatory Visit (INDEPENDENT_AMBULATORY_CARE_PROVIDER_SITE_OTHER): Payer: 59

## 2017-04-19 VITALS — BP 133/80 | HR 74 | Temp 97.6°F

## 2017-04-19 DIAGNOSIS — Q762 Congenital spondylolisthesis: Secondary | ICD-10-CM | POA: Diagnosis not present

## 2017-04-19 DIAGNOSIS — M961 Postlaminectomy syndrome, not elsewhere classified: Secondary | ICD-10-CM

## 2017-04-19 DIAGNOSIS — M797 Fibromyalgia: Secondary | ICD-10-CM

## 2017-04-19 DIAGNOSIS — M5416 Radiculopathy, lumbar region: Secondary | ICD-10-CM

## 2017-04-19 DIAGNOSIS — M419 Scoliosis, unspecified: Secondary | ICD-10-CM

## 2017-04-19 MED ORDER — BETAMETHASONE SOD PHOS & ACET 6 (3-3) MG/ML IJ SUSP
12.0000 mg | Freq: Once | INTRAMUSCULAR | Status: AC
Start: 2017-04-19 — End: 2017-04-19
  Administered 2017-04-19: 12 mg

## 2017-04-19 MED ORDER — LIDOCAINE HCL (PF) 1 % IJ SOLN
2.0000 mL | Freq: Once | INTRAMUSCULAR | Status: AC
Start: 2017-04-19 — End: 2017-04-19
  Administered 2017-04-19: 2 mL

## 2017-04-19 NOTE — Patient Instructions (Signed)

## 2017-04-19 NOTE — Progress Notes (Signed)
Erika Osborn - 66 y.o. female MRN [id number]  Date of birth: 1951-04-04  Office Visit Note: Visit Date: 04/19/2017 PCP: Briscoe Deutscher, DO Referred by: Briscoe Deutscher, DO  Subjective: Chief Complaint  Patient presents with  . Lower Back - Pain  . Right Hip - Pain  . Left Leg - Pain  . Right Leg - Pain   HPI: Erika Osborn is a 66 year old female who comes in today at the request of Dr. Paulla Fore for evaluation and management and possible epidural injection of the lumbar spine.  Recent MRI which is reviewed below does show grade 1 listhesis with spondylolysis at L5-S1.  Talking about her pain though is quite unique and that she reports pain in the neck and shoulders and arm as well as the right hip and leg.  Her leg pain and hip pain is more of an L5 distribution.  She has had prior lumbar surgery which she says was at L5.  Looking at the MRI this appears to be L4-5 level there does appear to be ligamentum flavum at the L5-S1 level.  I does not even really talk about prior surgery.  At least in the report.  She also has significant fibromyalgia.  She has a lot of tenderness everywhere.  She does get some paresthesias in the right hip and leg.  It is worse with standing and walking.  She has really failed conservative care with therapy and medications.  She reports that she takes tramadol for her fibromyalgia.  Both legs but consistently more on the right.  She has had no focal weakness but is felt weak in general.    Review of Systems  Constitutional: Negative for chills, fever, malaise/fatigue and weight loss.  HENT: Negative for hearing loss and sinus pain.   Eyes: Negative for blurred vision, double vision and photophobia.  Respiratory: Negative for cough and shortness of breath.   Cardiovascular: Negative for chest pain, palpitations and leg swelling.  Gastrointestinal: Negative for abdominal pain, nausea and vomiting.  Genitourinary: Negative for flank pain.  Musculoskeletal: Positive for  back pain and joint pain. Negative for myalgias.  Skin: Negative for itching and rash.  Neurological: Positive for tingling. Negative for tremors, focal weakness and weakness.  Endo/Heme/Allergies: Negative.   Psychiatric/Behavioral: Negative for depression.  All other systems reviewed and are negative.  Otherwise per HPI.  Assessment & Plan: Visit Diagnoses:  1. Lumbar radiculopathy   2. Post laminectomy syndrome   3. Congenital spondylolysis   4. Scoliosis of thoracolumbar spine, unspecified scoliosis type   5. Fibromyalgia     Plan: Findings:  Chronic worsening chronic pain history with history of prior lumbar laminectomy and history of grade 1 listhesis at L5-S1 with spondylolysis.  Her symptoms seem to be consistent with an L5 radicular pattern.  Because of the prior back surgery we are going to complete an L5 transforaminal epidural steroid injection on the right.  This would be diagnostic and hopefully therapeutic.  She may be having some pain from the pars defect itself with some facet arthropathy.  She also has some mild disc protrusions.  She is failed conservative care otherwise and we did have a long talk about fibromyalgia.  She had a lot of questions about fibromyalgia.  She did take some Valium that Dr. Paulla Fore provided for her prior to the injection which is fine.  Depending on the injection could look at facet joint block.  But she might actually be a decent candidate for spinal cord stimulation  because of her spine.  Factor is always is the fibromyalgia.  She should continue with exercise and sleep hygiene.    Meds & Orders:  Meds ordered this encounter  Medications  . lidocaine (PF) (XYLOCAINE) 1 % injection 2 mL  . betamethasone acetate-betamethasone sodium phosphate (CELESTONE) injection 12 mg    Orders Placed This Encounter  Procedures  . XR C-ARM NO REPORT  . Epidural Steroid injection    Follow-up: Return for Dr. Paulla Fore.   Procedures: No procedures performed    Lumbosacral Transforaminal Epidural Steroid Injection - Sub-Pedicular Approach with Fluoroscopic Guidance  Patient: Erika Osborn      Date of Birth: 1951-03-20 MRN: [id number] PCP: Briscoe Deutscher, DO      Visit Date: 04/19/2017   Universal Protocol:    Date/Time: 04/19/2017  Consent Given By: the patient  Position: PRONE  Additional Comments: Vital signs were monitored before and after the procedure. Patient was prepped and draped in the usual sterile fashion. The correct patient, procedure, and site was verified.   Injection Procedure Details:  Procedure Site One Meds Administered:  Meds ordered this encounter  Medications  . lidocaine (PF) (XYLOCAINE) 1 % injection 2 mL  . betamethasone acetate-betamethasone sodium phosphate (CELESTONE) injection 12 mg    Laterality: Right  Location/Site:  L5-S1  Needle size: 22 G  Needle type: Spinal  Needle Placement: Transforaminal  Findings:  -Contrast Used: 0.5 mL iohexol 180 mg iodine/mL   -Comments: Excellent flow of contrast along the nerve and into the epidural space.  Procedure Details: After squaring off the end-plates to get a true AP view, the C-arm was positioned so that an oblique view of the foramen as noted above was visualized. The target area is just inferior to the "nose of the scotty dog" or sub pedicular. The soft tissues overlying this structure were infiltrated with 2-3 ml. of 1% Lidocaine without Epinephrine.  The spinal needle was inserted toward the target using a "trajectory" view along the fluoroscope beam.  Under AP and lateral visualization, the needle was advanced so it did not puncture dura and was located close the 6 O'Clock position of the pedical in AP tracterory. Biplanar projections were used to confirm position. Aspiration was confirmed to be negative for CSF and/or blood. A 1-2 ml. volume of Isovue-250 was injected and flow of contrast was noted at each level. Radiographs were obtained for  documentation purposes.   After attaining the desired flow of contrast documented above, a 0.5 to 1.0 ml test dose of 0.25% Marcaine was injected into each respective transforaminal space.  The patient was observed for 90 seconds post injection.  After no sensory deficits were reported, and normal lower extremity motor function was noted,   the above injectate was administered so that equal amounts of the injectate were placed at each foramen (level) into the transforaminal epidural space.   Additional Comments:  The patient tolerated the procedure well Dressing: Band-Aid    Post-procedure details: Patient was observed during the procedure. Post-procedure instructions were reviewed.  Patient left the clinic in stable condition.       Clinical History: MRI LUMBAR SPINE WITHOUT CONTRAST  TECHNIQUE: Multiplanar, multisequence MR imaging of the lumbar spine was performed. No intravenous contrast was administered.  COMPARISON:  Two-view lumbar spine radiographs 10/21/2016  FINDINGS: Segmentation: 5 non rib-bearing lumbar type vertebral bodies are present.  Alignment: Grade 1 anterolisthesis at L5-S1 measures 8 mm. AP alignment is otherwise anatomic. Rightward curvature is centered at L2-3.  Vertebrae:  Marrow signal and vertebral body heights are normal.  Conus medullaris: Extends to the L1 level and appears normal.  Paraspinal and other soft tissues: Bilateral subcentimeter renal cysts are present. No other solid organ lesion is present. There is no significant adenopathy. Paraspinous musculature is within normal limits.  Disc levels:  T12-L1: Facet hypertrophy is worse on the left. There is no significant stenosis.  L1-2: Asymmetric left-sided facet hypertrophy and and disc bulging is present. Mild left subarticular narrowing results. Mild left foraminal narrowing is present.  L2-3: A leftward disc protrusion is present. Asymmetric left-sided facet  hypertrophy is noted. Mild left subarticular and foraminal narrowing is present.  L3-4: A leftward disc protrusion is present. Asymmetric facet hypertrophy is present. Mild left subarticular and moderate left foraminal stenosis is present. Facet hypertrophy contributes to mild right foraminal narrowing.  L4-5: The broad-based disc protrusion is asymmetric to the right. Facet hypertrophy is worse on the right. Mild right subarticular and moderate right foraminal stenosis is present. Mild left foraminal narrowing is noted.  L5-S1: Bilateral pars defects are present. There is uncovering of a broad-based disc protrusion. The central canal is patent. Moderate to severe foraminal stenosis is present bilaterally.  IMPRESSION: 1. Multilevel spondylosis of the cervical spine with moderate dextroconvex scoliosis. 2. Mild left subarticular and foraminal narrowing at L1-2 and L2-3. 3. Mild left subarticular moderate left foraminal stenosis at L3-4. 4. Mild right subarticular and moderate right foraminal stenosis at L4-5. 5. 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 6.   Electronically Signed   By: San Morelle M.D.   On: 03/30/2017 10:10  She reports that she has never smoked. She has never used smokeless tobacco.   Recent Labs  10/21/16 1051 02/08/17 0956  HGBA1C  --  5.7  LABURIC 4.5  --     Objective:  VS:  HT:    WT:   BMI:     BP:133/80  HR:74bpm  TEMP:97.6 F (36.4 C)(Oral)  RESP:98 % Physical Exam  Constitutional: She is oriented to person, place, and time. She appears well-developed and well-nourished.  Eyes: Pupils are equal, round, and reactive to light. Conjunctivae and EOM are normal.  Cardiovascular: Normal rate and intact distal pulses.   Pulmonary/Chest: Effort normal.  Musculoskeletal:  Patient ambulates without aid.  She has pain with  extension of the lumbar spine and she does have positive tender points along the lumbar spine PSIS and greater trochanters.  She has no groin pain with rotation she has good distal strength.  Neurological: She is alert and oriented to person, place, and time. She exhibits normal muscle tone.  Skin: Skin is warm and dry. No rash noted. No erythema.  Psychiatric: She has a normal mood and affect. Her behavior is normal.  Nursing note and vitals reviewed.   Ortho Exam Imaging: Xr C-arm No Report  Result Date: 04/19/2017 Please see Notes or Procedures tab for imaging impression.   Past Medical/Family/Surgical/Social History: Medications & Allergies reviewed per EMR Patient Active Problem List   Diagnosis Date Noted  . Elevated MCV 03/28/2017  . Elevated LFTs 03/28/2017  . Primary osteoarthritis of both hands 01/28/2017  . ANA positive 01/26/2017  . Positive anti-CCP test 11/30/2016  . Chronic bilateral low back pain with bilateral sciatica 11/16/2016  . Osteoarthritis of spine with radiculopathy, lumbar region 11/16/2016  . Polyarthralgia 11/16/2016  . Primary osteoarthritis of both knees 11/16/2016  .  Onychomycosis 11/22/2015  . Overweight (BMI 25.0-29.9) 06/25/2015  . 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 Allergic conjunctivitis and rhinitis 01/17/2014  . 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 Fibromyalgia 07/19/2013  . HTN (hypertension) 07/19/2013  . HLD (hyperlipidemia) 07/19/2013  . Ulcerative colitis ([place]) 07/19/2013   Past Medical History:  Diagnosis Date  . Arthritis   . Chronic back pain   . Complication of anesthesia    increase blood pressure for not taking meds  . Depression   . Fibromyalgia   . GERD (gastroesophageal reflux disease)   . Headache   . HLD (hyperlipidemia)   . HTN (hypertension)   . Hypothyroidism following radioiodine therapy   . Pneumonia    h/o pneumonia yrs. ago  . Ulcerative colitis ([place]) 1976   Family History  Problem  Relation Age of Onset  . Cancer Father   . Heart failure Mother   . Hyperlipidemia Mother   . Osteoporosis Mother   . Hyperlipidemia Brother   . Hyperlipidemia Sister    Past Surgical History:  Procedure Laterality Date  . APPENDECTOMY    . BREAST REDUCTION SURGERY    . BREAST REDUCTION SURGERY Bilateral 06/22/2005  . COLONOSCOPY W/ BIOPSIES AND POLYPECTOMY    . LUMBAR DISC SURGERY    . RADIOLOGY WITH ANESTHESIA N/A 03/30/2017   Procedure: RADIOLOGY WITH ANESTHESIA-LUMBAR SPINE WITHOUT CONTRAST;  Surgeon: Radiologist, Medication, MD;  Location: [facility];  Service: Radiology;  Laterality: N/A;  . TONSILLECTOMY    . UTERINE SUSPENSION    . WISDOM TOOTH EXTRACTION     Social History   Occupational History  . Unemployed    Social History Main Topics  . Smoking status: Never Smoker  . Smokeless tobacco: Never Used  . Alcohol use No  . Drug use: No  . Sexual activity: Yes    Partners: Male     Comment: Married.

## 2017-04-19 NOTE — Progress Notes (Deleted)
Pt is here for ESI injection of lower back, pt is hurting in the lower back, states MRI shows pinched nerve, pain in radiating down both legs mostly right side, but some days her left. 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 No allergy to contrast dye. Some numbness and tingling in the feet area. Hurts most when she is on her feet a lot, hurts some when sitting. Pt says can not lay on her right side without hurting and has to lay on stomach to go to sleep. Pt is taking tramadol which is taking for fibromyalgia and tylenol arthritis.

## 2017-04-20 ENCOUNTER — Encounter (INDEPENDENT_AMBULATORY_CARE_PROVIDER_SITE_OTHER): Payer: Self-pay | Admitting: Physical Medicine and Rehabilitation

## 2017-04-20 NOTE — Procedures (Signed)
Lumbosacral Transforaminal Epidural Steroid Injection - Sub-Pedicular Approach with Fluoroscopic Guidance  Patient: Erika Osborn      Date of Birth: 04/22/1951 MRN: [id number] PCP: Briscoe Deutscher, DO      Visit Date: 04/19/2017   Universal Protocol:    Date/Time: 04/19/2017  Consent Given By: the patient  Position: PRONE  Additional Comments: Vital signs were monitored before and after the procedure. Patient was prepped and draped in the usual sterile fashion. The correct patient, procedure, and site was verified.   Injection Procedure Details:  Procedure Site One Meds Administered:  Meds ordered this encounter  Medications  . lidocaine (PF) (XYLOCAINE) 1 % injection 2 mL  . betamethasone acetate-betamethasone sodium phosphate (CELESTONE) injection 12 mg    Laterality: Right  Location/Site:  L5-S1  Needle size: 22 G  Needle type: Spinal  Needle Placement: Transforaminal  Findings:  -Contrast Used: 0.5 mL iohexol 180 mg iodine/mL   -Comments: Excellent flow of contrast along the nerve and into the epidural space.  Procedure Details: After squaring off the end-plates to get a true AP view, the C-arm was positioned so that an oblique view of the foramen as noted above was visualized. The target area is just inferior to the "nose of the scotty dog" or sub pedicular. The soft tissues overlying this structure were infiltrated with 2-3 ml. of 1% Lidocaine without Epinephrine.  The spinal needle was inserted toward the target using a "trajectory" view along the fluoroscope beam.  Under AP and lateral visualization, the needle was advanced so it did not puncture dura and was located close the 6 O'Clock position of the pedical in AP tracterory. Biplanar projections were used to confirm position. Aspiration was confirmed to be negative for CSF and/or blood. A 1-2 ml. volume of Isovue-250 was injected and flow of contrast was noted at each level. Radiographs were obtained for  documentation purposes.   After attaining the desired flow of contrast documented above, a 0.5 to 1.0 ml test dose of 0.25% Marcaine was injected into each respective transforaminal space.  The patient was observed for 90 seconds post injection.  After no sensory deficits were reported, and normal lower extremity motor function was noted,   the above injectate was administered so that equal amounts of the injectate were placed at each foramen (level) into the transforaminal epidural space.   Additional Comments:  The patient tolerated the procedure well Dressing: Band-Aid    Post-procedure details: Patient was observed during the procedure. Post-procedure instructions were reviewed.  Patient left the clinic in stable condition.

## 2017-05-17 ENCOUNTER — Other Ambulatory Visit: Payer: Self-pay | Admitting: Family Medicine

## 2017-05-31 ENCOUNTER — Ambulatory Visit: Payer: 59 | Admitting: Sports Medicine

## 2017-06-04 ENCOUNTER — Other Ambulatory Visit: Payer: Self-pay | Admitting: Family Medicine

## 2017-06-04 DIAGNOSIS — M797 Fibromyalgia: Secondary | ICD-10-CM

## 2017-06-04 DIAGNOSIS — G8929 Other chronic pain: Secondary | ICD-10-CM

## 2017-06-04 DIAGNOSIS — M545 Low back pain: Secondary | ICD-10-CM

## 2017-06-04 DIAGNOSIS — M25561 Pain in right knee: Secondary | ICD-10-CM

## 2017-06-04 DIAGNOSIS — M25562 Pain in left knee: Secondary | ICD-10-CM

## 2017-06-07 ENCOUNTER — Ambulatory Visit (INDEPENDENT_AMBULATORY_CARE_PROVIDER_SITE_OTHER): Payer: 59 | Admitting: Sports Medicine

## 2017-06-07 ENCOUNTER — Encounter: Payer: Self-pay | Admitting: Sports Medicine

## 2017-06-07 VITALS — BP 118/82 | HR 66 | Ht 60.0 in | Wt 137.6 lb

## 2017-06-07 DIAGNOSIS — M4726 Other spondylosis with radiculopathy, lumbar region: Secondary | ICD-10-CM | POA: Diagnosis not present

## 2017-06-07 DIAGNOSIS — K5180 Other ulcerative colitis without complications: Secondary | ICD-10-CM | POA: Diagnosis not present

## 2017-06-07 DIAGNOSIS — M797 Fibromyalgia: Secondary | ICD-10-CM

## 2017-06-07 DIAGNOSIS — M2550 Pain in unspecified joint: Secondary | ICD-10-CM

## 2017-06-07 NOTE — Telephone Encounter (Signed)
Okay refill. 

## 2017-06-07 NOTE — Progress Notes (Signed)
Erika Osborn. Erika Osborn, [place] at [facility] [phone]  [name] - 66 y.o. female MRN [id number]  Date of birth: 31-Oct-1950  Visit Date: 06/07/2017  PCP: Briscoe Deutscher, DO   Referred by: Briscoe Deutscher, DO   Scribe for today's visit: Josepha Pigg, CMA    SUBJECTIVE:  Erika Osborn is here for Follow-up; low back pain w/ radiculopathy; and RT hip pain  04/05/17 summary: Erika Osborn is an established patient presenting today for evaluation of RT hip pain. She reports that pain has worsened since she was last seen. Pain is worse after doing a lot of walking. The pain is constant and radiates into the RT leg. She has noticed some weakness in the RT leg. She also c/o low back pain. She had MRI L-spine 03/30/17     Compared to the last office visit, her previously described symptoms show no change. Her knee's have been bothering her a little more b/c of the weather.  Current symptoms are moderate-severe & are radiating to legs She had epidural injection with Dr. Ernestina Patches 04/19/17, she reports that she didn't get much relief from this. She had been prescribed Gabapentin but it made her feel lethargic and dizzy so she stopped taking it.    ROS Reports night time disturbances. Denies fevers, chills, or night sweats. Denies unexplained weight loss. Denies personal history of cancer. Reports changes in bowel or bladder habits, she has ulcerative colitis. Denies recent unreported falls. Denies new or worsening dyspnea or wheezing. Reports headaches or dizziness.  Reports numbness, tingling or weakness  In the extremities.  Denies dizziness or presyncopal episodes Reports lower extremity edema     HISTORY & PERTINENT PRIOR DATA:  Prior History reviewed and updated per electronic medical record.  Significant history, findings, studies and interim changes include:  reports that  has never smoked. she has never used smokeless tobacco. Recent  Labs    10/21/16 1051 02/08/17 0956  HGBA1C  --  5.7  LABURIC 4.5  --    **colonoscopy performed, just need record request completed - please address at next OV** No problems updated.   OBJECTIVE:  VS:  HT:5' (152.4 cm)   WT:137 lb 9.6 oz (62.4 kg)  BMI:26.87    BP:118/82  HR:66bpm  TEMP: ( )  RESP:96 %  PHYSICAL EXAM: Constitutional: WDWN, Non-toxic appearing. Psychiatric: Alert & appropriately interactive. Not depressed or anxious appearing. Respiratory: No increased work of breathing. Trachea Midline Eyes: Pupils are equal. EOM intact without nystagmus. No scleral icterus Cardiovascular:   No clubbing or cyanosis appreciated Capillary Refill is normal, less than 2 seconds No signficant generalized edema/anasarca Sensory Exam: intact to light touch  Bilateral lower extremities overall well aligned.  Marked tightness with straight leg raise.  Good internal and external rotation of her hips but she does have a small amount of in end range pain with internal and external rotation.  Lower extremity strength is diffusely decreased however no focal weakness appreciated.   ASSESSMENT & PLAN:   1. Fibromyalgia   2. Osteoarthritis of spine with radiculopathy, lumbar region   3. Polyarthralgia   4. Other ulcerative colitis without complication ([place])    PLAN: Patient has multiple issues for underlying pain.  She does have a primary diagnosis of fibromyalgia however this is complicated by underlying osteoarthritic changes and core deconditioning.  She is responded well to aqua therapy in the past and will plan to refer her back to physical  therapy for generalized core conditioning as well as consideration of aqua therapy.   Brief discussion on CBD oil per AVS   No problem-specific Assessment & Plan notes found for this encounter.   ++++++++++++++++++++++++++++++++++++++++++++ Orders & Meds: Orders Placed This Encounter  Procedures  . Ambulatory referral to Physical Therapy     No orders of the defined types were placed in this encounter.   ++++++++++++++++++++++++++++++++++++++++++++ Follow-up: Return in about 3 months (around 09/05/2017).   Pertinent documentation may be included in additional procedure notes, imaging studies, problem based documentation and patient instructions. Please see these sections of the encounter for additional information regarding this visit. CMA/ATC served as Education administrator during this visit. History, Physical, and Plan performed by medical provider. Documentation and orders reviewed and attested to.      [name], [place] [facility] Physician

## 2017-06-07 NOTE — Telephone Encounter (Signed)
Please advise on refill.

## 2017-06-07 NOTE — Patient Instructions (Addendum)
It was good to see you.  I would like for [facility] to [facility] to [facility] to work with them for your back and hips as well as considering doing aquatic therapy.  You can call them at [phone].  We will send a referral over to them as well.   [facility] on [address] has CBD oil.  I think it is worth having you try although we do not have good evidence to support its use.  We can also consider using Lyrica as an option for you going forward but would like to see how you respond to these other treatments initially.  Also happy to always inject her hip for your knee as needed.

## 2017-06-08 ENCOUNTER — Telehealth: Payer: Self-pay | Admitting: Family Medicine

## 2017-06-08 NOTE — Telephone Encounter (Signed)
Notified patient that prescription was at the pharmacy.

## 2017-06-08 NOTE — Telephone Encounter (Signed)
Please advise on medication refill request below.

## 2017-06-08 NOTE — Telephone Encounter (Signed)
Copied from [place]. Topic: Quick Communication - See Telephone Encounter >> Jun 08, 2017 10:51 AM Percell Belt A wrote: CRM for notification. See Telephone encounter for: pt called in and stated that husband was just at the pharmacy and they do not have the  traMADol (ULTRAM) 50 MG tablet [[id number]] .  It show it was ok's and sent but they do not have it.  Can this be sent again.  I confirmed it was the correct pharmacy    06/08/17.

## 2017-06-08 NOTE — Telephone Encounter (Signed)
Tramadol

## 2017-06-14 ENCOUNTER — Other Ambulatory Visit: Payer: Self-pay | Admitting: Family Medicine

## 2017-06-21 ENCOUNTER — Encounter: Payer: Self-pay | Admitting: Sports Medicine

## 2017-06-25 NOTE — Progress Notes (Signed)
Office Visit Note  Patient: Erika Osborn             Date of Birth: 1950-11-02           MRN: [id number]             PCP: Briscoe Deutscher, DO Referring: Briscoe Deutscher, DO Visit Date: 07/09/2017 Occupation: @GUAROCC @    Subjective:  Other (pain, patient is taking aquatic therapy )   History of Present Illness: Erika Osborn is a 67 y.o. female with history of ulcerative colitis and osteoarthritis. She states she's been having pain and discomfort in her bilateral hands and bilateral knee joints. She has been having some swelling in her bilateral knee joints off and on. She continues to have some discomfort in the right trochanteric bursa. Left shoulder is a still painful. She continues to have discomfort in her lower back where she has disc disease and fusion. Her ulcerative colitis is in remission. She states she will be having a colonoscopy this spring. She continues to have discomfort from fibromyalgia syndrome. She describes the pain from fibromyalgia on the scale of 0-10 about an 8.  Activities of Daily Living:  Patient reports morning stiffness for 20 minutes.   Patient Reports nocturnal pain.  Difficulty dressing/grooming: Denies Difficulty climbing stairs: Reports Difficulty getting out of chair: Reports Difficulty using hands for taps, buttons, cutlery, and/or writing: Denies   Review of Systems  Constitutional: Positive for fatigue. Negative for night sweats, weight gain, weight loss and weakness.  HENT: Negative for mouth sores, trouble swallowing, trouble swallowing, mouth dryness and nose dryness.   Eyes: Negative for pain, redness, visual disturbance and dryness.  Respiratory: Negative for cough, shortness of breath and difficulty breathing.   Cardiovascular: Negative for chest pain, palpitations, hypertension, irregular heartbeat and swelling in legs/feet.  Gastrointestinal: Negative for blood in stool, constipation and diarrhea.  Endocrine: Negative for increased  urination.  Genitourinary: Negative for vaginal dryness.  Musculoskeletal: Positive for arthralgias, joint pain and morning stiffness. Negative for joint swelling, myalgias, muscle weakness, muscle tenderness and myalgias.  Skin: Negative for color change, rash, hair loss, skin tightness, ulcers and sensitivity to sunlight.  Allergic/Immunologic: Negative for susceptible to infections.  Neurological: Negative for dizziness, memory loss and night sweats.  Hematological: Negative for swollen glands.  Psychiatric/Behavioral: Positive for sleep disturbance. Negative for depressed mood. The patient is not nervous/anxious.     PMFS History:  Patient Active Problem List   Diagnosis Date Noted  . Elevated MCV 03/28/2017  . Elevated LFTs 03/28/2017  . Primary osteoarthritis of both hands 01/28/2017  . ANA positive 01/26/2017  . Positive anti-CCP test 11/30/2016  . Chronic bilateral low back pain with bilateral sciatica 11/16/2016  . Osteoarthritis of spine with radiculopathy, lumbar region 11/16/2016  . Polyarthralgia 11/16/2016  . Primary osteoarthritis of both knees 11/16/2016  . Onychomycosis 11/22/2015  . Overweight (BMI 25.0-29.9) 06/25/2015  . 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 Allergic conjunctivitis and rhinitis 01/17/2014  . 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 Fibromyalgia 07/19/2013  . HTN (hypertension) 07/19/2013  . HLD (hyperlipidemia) 07/19/2013  . Ulcerative colitis ([place]) 07/19/2013    Past Medical History:  Diagnosis Date  . Arthritis   . Chronic back pain   . Complication of anesthesia    increase blood pressure for not taking meds  . Depression   . Fibromyalgia   . GERD (gastroesophageal reflux disease)   . Headache   . HLD (hyperlipidemia)   . HTN (hypertension)   .  Hypothyroidism following radioiodine therapy   . Pneumonia    h/o pneumonia yrs. ago  . Ulcerative colitis ([place]) 1976    Family History  Problem Relation Age of Onset  . Cancer  Father   . Heart failure Mother   . Hyperlipidemia Mother   . Osteoporosis Mother   . Hyperlipidemia Brother   . Hyperlipidemia Sister    Past Surgical History:  Procedure Laterality Date  . APPENDECTOMY    . BREAST REDUCTION SURGERY    . BREAST REDUCTION SURGERY Bilateral 06/22/2005  . COLONOSCOPY W/ BIOPSIES AND POLYPECTOMY    . LUMBAR DISC SURGERY    . RADIOLOGY WITH ANESTHESIA N/A 03/30/2017   Procedure: RADIOLOGY WITH ANESTHESIA-LUMBAR SPINE WITHOUT CONTRAST;  Surgeon: Radiologist, Medication, MD;  Location: [facility];  Service: Radiology;  Laterality: N/A;  . TONSILLECTOMY    . UTERINE SUSPENSION    . WISDOM TOOTH EXTRACTION     Social History   Social History Narrative  . Not on file     Objective: Vital Signs: BP 119/76 (BP Location: Left Arm, Patient Position: Sitting, Cuff Size: Normal)   Pulse 75   Resp 13   Ht 5' (1.524 m)   Wt 136 lb (61.7 kg)   BMI 26.56 kg/m    Physical Exam  Constitutional: She is oriented to person, place, and time. She appears well-developed and well-nourished.  HENT:  Head: Normocephalic and atraumatic.  Eyes: Conjunctivae and EOM are normal.  Neck: Normal range of motion.  Cardiovascular: Normal rate, regular rhythm, normal heart sounds and intact distal pulses.  Pulmonary/Chest: Effort normal and breath sounds normal.  Abdominal: Soft. Bowel sounds are normal.  Lymphadenopathy:    She has no cervical adenopathy.  Neurological: She is alert and oriented to person, place, and time.  Skin: Skin is warm and dry. Capillary refill takes less than 2 seconds.  Psychiatric: She has a normal mood and affect. Her behavior is normal.  Nursing note and vitals reviewed.    Musculoskeletal Exam: C-spine good range of motion. She has limited painful range of motion of her lumbar spine. Shoulder joints elbow joints are good range of motion. She is DIP PIP thickening in her hands without any synovitis. She has some tenderness over right trochanteric  bursa. She has crepitus in her knee joints without any warmth swelling or effusion.  CDAI Exam: No CDAI exam completed.    Investigation: No additional findings. CBC Latest Ref Rng & Units 03/29/2017 03/03/2017 02/08/2017  WBC 4.0 - 10.5 K/uL 4.7 5.7 5.5  Hemoglobin 12.0 - 15.0 g/dL 12.2 12.2 12.7  Hematocrit 36.0 - 46.0 % 36.0 36.0 37.5  Platelets 150.0 - 400.0 K/uL 323.0 328.0 373.0   CMP Latest Ref Rng & Units 03/29/2017 03/03/2017 02/08/2017  Glucose 70 - 99 mg/dL 87 84 92  BUN 6 - 23 mg/dL 12 9 12   Creatinine 0.40 - 1.20 mg/dL 0.56 0.56 0.58  Sodium 135 - 145 mEq/L 138 136 139  Potassium 3.5 - 5.1 mEq/L 3.4(L) 3.7 3.7  Chloride 96 - 112 mEq/L 98 95(L) 98  CO2 19 - 32 mEq/L 33(H) 33(H) 34(H)  Calcium 8.4 - 10.5 mg/dL 9.7 10.0 9.9  Total Protein 6.0 - 8.3 g/dL 7.3 7.1 7.7  Total Bilirubin 0.2 - 1.2 mg/dL 0.5 0.4 0.5  Alkaline Phos 39 - 117 U/L 79 79 81  AST 0 - 37 U/L 15 15 18   ALT 0 - 35 U/L 12 11 12     Imaging: No results found.  Speciality Comments: No specialty comments available.    Procedures:  No procedures performed Allergies: Humira [adalimumab]; Remicade [infliximab]; Penicillins; Erythromycin; and Latex   Assessment / Plan:     Visit Diagnoses: Other ulcerative colitis without complication ([place]) - patient states that she is in remission now. She has colonoscopy coming up this spring.  High risk medication use - On Imuran 100 mg by mouth daily by Dr. Daisey Must.(Remicade-inadequate response, Humira-shortness of breath).   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 Anti-CCP antibody can be seen with ulcerative colitis.  Primary osteoarthritis of both hands: She is severe osteoarthritis in her hands. Joint protection and muscle strengthening discussed. A handout on hand muscle strengthening exercise was given.  Primary osteoarthritis of both knees: She is severe osteoarthritis in her  knee joints. I discussed the option of total knee replacement but she declined. A handout on knee joint and muscle strengthening exercises was given.  Trochanteric bursitis of right hip: She continues to have discomfort in her right trochanteric bursa. She states doing exercises are difficult for her. She just joined [employer] which is been helpful.  Adhesive capsulitis of left shoulder: Chronic pain  DDD (degenerative disc disease), lumbar - Status post fusion. She has limited painful range of motion.  History of fibromyalgia: She's chronic pain and discomfort from fibromyalgia.  History of hypothyroidism - postradioiodine therapy   History of hyperlipidemia  History of hypertension    Orders: No orders of the defined types were placed in this encounter.  No orders of the defined types were placed in this encounter.   Face-to-face time spent with patient was 30 minutes. Greater than 50% of time was spent in counseling and coordination of care.  Follow-Up Instructions: Return in about 1 year (around 07/09/2018) for UC, OA, DDD.   Bo Merino, MD  Note - This record has been created using Editor, commissioning.  Chart creation errors have been sought, but may not always  have been located. Such creation errors do not reflect on  the standard of medical care.

## 2017-07-06 ENCOUNTER — Telehealth: Payer: Self-pay | Admitting: Family Medicine

## 2017-07-06 ENCOUNTER — Other Ambulatory Visit: Payer: Self-pay | Admitting: Family Medicine

## 2017-07-06 DIAGNOSIS — Z1231 Encounter for screening mammogram for malignant neoplasm of breast: Secondary | ICD-10-CM

## 2017-07-06 DIAGNOSIS — N958 Other specified menopausal and perimenopausal disorders: Secondary | ICD-10-CM

## 2017-07-06 NOTE — Telephone Encounter (Signed)
See note

## 2017-07-06 NOTE — Telephone Encounter (Signed)
Copied from [place]. Topic: Quick Communication - See Telephone Encounter >> Jul 06, 2017  4:29 PM Bea Graff, NT wrote: CRM for notification. See Telephone encounter for: Erika Osborn from the [facility] calling to request a new order for the bone density scan for this pt. The one that is in the system was from last year. The diagnosis will needed to be changed on the order as well. CB#: [phone]  07/06/17.

## 2017-07-07 NOTE — Telephone Encounter (Signed)
New bone density ordered. Will call the patient.

## 2017-07-07 NOTE — Telephone Encounter (Signed)
Left message for patient that new scan has been placed and she can schedule now.

## 2017-07-09 ENCOUNTER — Ambulatory Visit: Payer: Managed Care, Other (non HMO) | Admitting: Rheumatology

## 2017-07-09 ENCOUNTER — Encounter: Payer: Self-pay | Admitting: Rheumatology

## 2017-07-09 VITALS — BP 119/76 | HR 75 | Resp 13 | Ht 60.0 in | Wt 136.0 lb

## 2017-07-09 DIAGNOSIS — M170 Bilateral primary osteoarthritis of knee: Secondary | ICD-10-CM

## 2017-07-09 DIAGNOSIS — Z8679 Personal history of other diseases of the circulatory system: Secondary | ICD-10-CM

## 2017-07-09 DIAGNOSIS — K5180 Other ulcerative colitis without complications: Secondary | ICD-10-CM | POA: Diagnosis not present

## 2017-07-09 DIAGNOSIS — M7502 Adhesive capsulitis of left shoulder: Secondary | ICD-10-CM

## 2017-07-09 DIAGNOSIS — Z8739 Personal history of other diseases of the musculoskeletal system and connective tissue: Secondary | ICD-10-CM

## 2017-07-09 DIAGNOSIS — Z8639 Personal history of other endocrine, nutritional and metabolic disease: Secondary | ICD-10-CM

## 2017-07-09 DIAGNOSIS — M19041 Primary osteoarthritis, right hand: Secondary | ICD-10-CM

## 2017-07-09 DIAGNOSIS — M51369 Other intervertebral disc degeneration, lumbar region without mention of lumbar back pain or lower extremity pain: Secondary | ICD-10-CM

## 2017-07-09 DIAGNOSIS — M19042 Primary osteoarthritis, left hand: Secondary | ICD-10-CM

## 2017-07-09 DIAGNOSIS — Z79899 Other long term (current) drug therapy: Secondary | ICD-10-CM | POA: Diagnosis not present

## 2017-07-09 DIAGNOSIS — R7689 Other specified abnormal immunological findings in serum: Secondary | ICD-10-CM

## 2017-07-09 DIAGNOSIS — R7681 Abnormal rheumatoid factor and anti-citrullinated protein antibody without rheumatoid arthritis: Secondary | ICD-10-CM

## 2017-07-09 DIAGNOSIS — R768 Other specified abnormal immunological findings in serum: Secondary | ICD-10-CM | POA: Diagnosis not present

## 2017-07-09 DIAGNOSIS — M5136 Other intervertebral disc degeneration, lumbar region: Secondary | ICD-10-CM | POA: Diagnosis not present

## 2017-07-09 DIAGNOSIS — M7061 Trochanteric bursitis, right hip: Secondary | ICD-10-CM

## 2017-07-09 NOTE — Patient Instructions (Signed)
Knee Exercises Ask your health care provider which exercises are safe for you. Do exercises exactly as told by your health care provider and adjust them as directed. It is normal to feel mild stretching, pulling, tightness, or discomfort as you do these exercises, but you should stop right away if you feel sudden pain or your pain gets worse.Do not begin these exercises until told by your health care provider. STRETCHING AND RANGE OF MOTION EXERCISES These exercises warm up your muscles and joints and improve the movement and flexibility of your knee. These exercises also help to relieve pain, numbness, and tingling. Exercise A: Knee Extension, Prone 1. Lie on your abdomen on a bed. 2. Place your left / right knee just beyond the edge of the surface so your knee is not on the bed. You can put a towel under your left / right thigh just above your knee for comfort. 3. Relax your leg muscles and allow gravity to straighten your knee. You should feel a stretch behind your left / right knee. 4. Hold this position for __________ seconds. 5. Scoot up so your knee is supported between repetitions. Repeat __________ times. Complete this stretch __________ times a day. Exercise B: Knee Flexion, Active  1. Lie on your back with both knees straight. If this causes back discomfort, bend your left / right knee so your foot is flat on the floor. 2. Slowly slide your left / right heel back toward your buttocks until you feel a gentle stretch in the front of your knee or thigh. 3. Hold this position for __________ seconds. 4. Slowly slide your left / right heel back to the starting position. Repeat __________ times. Complete this exercise __________ times a day. Exercise C: Quadriceps, Prone  1. Lie on your abdomen on a firm surface, such as a bed or padded floor. 2. Bend your left / right knee and hold your ankle. If you cannot reach your ankle or pant leg, loop a belt around your foot and grab the belt  instead. 3. Gently pull your heel toward your buttocks. Your knee should not slide out to the side. You should feel a stretch in the front of your thigh and knee. 4. Hold this position for __________ seconds. Repeat __________ times. Complete this stretch __________ times a day. Exercise D: Hamstring, Supine 1. Lie on your back. 2. Loop a belt or towel over the ball of your left / right foot. The ball of your foot is on the walking surface, right under your toes. 3. Straighten your left / right knee and slowly pull on the belt to raise your leg until you feel a gentle stretch behind your knee. ? Do not let your left / right knee bend while you do this. ? Keep your other leg flat on the floor. 4. Hold this position for __________ seconds. Repeat __________ times. Complete this stretch __________ times a day. STRENGTHENING EXERCISES These exercises build strength and endurance in your knee. Endurance is the ability to use your muscles for a long time, even after they get tired. Exercise E: Quadriceps, Isometric  1. Lie on your back with your left / right leg extended and your other knee bent. Put a rolled towel or small pillow under your knee if told by your health care provider. 2. Slowly tense the muscles in the front of your left / right thigh. You should see your kneecap slide up toward your hip or see increased dimpling just above the knee. This  motion will push the back of the knee toward the floor. 3. For __________ seconds, keep the muscle as tight as you can without increasing your pain. 4. Relax the muscles slowly and completely. Repeat __________ times. Complete this exercise __________ times a day. Exercise F: Straight Leg Raises - Quadriceps 1. Lie on your back with your left / right leg extended and your other knee bent. 2. Tense the muscles in the front of your left / right thigh. You should see your kneecap slide up or see increased dimpling just above the knee. Your thigh may  even shake a bit. 3. Keep these muscles tight as you raise your leg 4-6 inches (10-15 cm) off the floor. Do not let your knee bend. 4. Hold this position for __________ seconds. 5. Keep these muscles tense as you lower your leg. 6. Relax your muscles slowly and completely after each repetition. Repeat __________ times. Complete this exercise __________ times a day. Exercise G: Hamstring, Isometric 1. Lie on your back on a firm surface. 2. Bend your left / right knee approximately __________ degrees. 3. Dig your left / right heel into the surface as if you are trying to pull it toward your buttocks. Tighten the muscles in the back of your thighs to dig as hard as you can without increasing any pain. 4. Hold this position for __________ seconds. 5. Release the tension gradually and allow your muscles to relax completely for __________ seconds after each repetition. Repeat __________ times. Complete this exercise __________ times a day. Exercise H: Hamstring Curls  If told by your health care provider, do this exercise while wearing ankle weights. Begin with __________ weights. Then increase the weight by 1 lb (0.5 kg) increments. Do not wear ankle weights that are more than __________. 1. Lie on your abdomen with your legs straight. 2. Bend your left / right knee as far as you can without feeling pain. Keep your hips flat against the floor. 3. Hold this position for __________ seconds. 4. Slowly lower your leg to the starting position.  Repeat __________ times. Complete this exercise __________ times a day. Exercise I: Squats (Quadriceps) 1. Stand in front of a table, with your feet and knees pointing straight ahead. You may rest your hands on the table for balance but not for support. 2. Slowly bend your knees and lower your hips like you are going to sit in a chair. ? Keep your weight over your heels, not over your toes. ? Keep your lower legs upright so they are parallel with the table  legs. ? Do not let your hips go lower than your knees. ? Do not bend lower than told by your health care provider. ? If your knee pain increases, do not bend as low. 3. Hold the squat position for __________ seconds. 4. Slowly push with your legs to return to standing. Do not use your hands to pull yourself to standing. Repeat __________ times. Complete this exercise __________ times a day. Exercise J: Wall Slides (Quadriceps)  1. Lean your back against a smooth wall or door while you walk your feet out 18-24 inches (46-61 cm) from it. 2. Place your feet hip-width apart. 3. Slowly slide down the wall or door until your knees bend __________ degrees. Keep your knees over your heels, not over your toes. Keep your knees in line with your hips. 4. Hold for __________ seconds. Repeat __________ times. Complete this exercise __________ times a day. Exercise K: Straight Leg Raises -  Hip Abductors 1. Lie on your side with your left / right leg in the top position. Lie so your head, shoulder, knee, and hip line up. You may bend your bottom knee to help you keep your balance. 2. Roll your hips slightly forward so your hips are stacked directly over each other and your left / right knee is facing forward. 3. Leading with your heel, lift your top leg 4-6 inches (10-15 cm). You should feel the muscles in your outer hip lifting. ? Do not let your foot drift forward. ? Do not let your knee roll toward the ceiling. 4. Hold this position for __________ seconds. 5. Slowly return your leg to the starting position. 6. Let your muscles relax completely after each repetition. Repeat __________ times. Complete this exercise __________ times a day. Exercise L: Straight Leg Raises - Hip Extensors 1. Lie on your abdomen on a firm surface. You can put a pillow under your hips if that is more comfortable. 2. Tense the muscles in your buttocks and lift your left / right leg about 4-6 inches (10-15 cm). Keep your knee  straight as you lift your leg. 3. Hold this position for __________ seconds. 4. Slowly lower your leg to the starting position. 5. Let your leg relax completely after each repetition. Repeat __________ times. Complete this exercise __________ times a day. This information is not intended to replace advice given to you by your health care provider. Make sure you discuss any questions you have with your health care provider. Document Released: 04/22/2005 Document Revised: 03/02/2016 Document Reviewed: 04/14/2015 Elsevier Interactive Patient Education  2018 [place] Exercises Hand exercises can be helpful to almost anyone. These exercises can strengthen the hands, improve flexibility and movement, and increase blood flow to the hands. These results can make work and daily tasks easier. Hand exercises can be especially helpful for people who have joint pain from arthritis or have nerve damage from overuse (carpal tunnel syndrome). These exercises can also help people who have injured a hand. Most of these hand exercises are fairly gentle stretching routines. You can do them often throughout the day. Still, it is a good idea to ask your health care provider which exercises would be best for you. Warming your hands before exercise may help to reduce stiffness. You can do this with gentle massage or by placing your hands in warm water for 15 minutes. Also, make sure you pay attention to your level of hand pain as you begin an exercise routine. Exercises Knuckle Bend Repeat this exercise 5-10 times with each hand. 1. Stand or sit with your arm, hand, and all five fingers pointed straight up. Make sure your wrist is straight. 2. Gently and slowly bend your fingers down and inward until the tips of your fingers are touching the tops of your palm. 3. Hold this position for a few seconds. 4. Extend your fingers out to their original position, all pointing straight up again.  Finger Fan Repeat this  exercise 5-10 times with each hand. 1. Hold your arm and hand out in front of you. Keep your wrist straight. 2. Squeeze your hand into a fist. 3. Hold this position for a few seconds. 4. Edison Simon out, or spread apart, your hand and fingers as much as possible, stretching every joint fully.  Tabletop Repeat this exercise 5-10 times with each hand. 1. Stand or sit with your arm, hand, and all five fingers pointed straight up. Make sure your wrist is straight.  2. Gently and slowly bend your fingers at the knuckles where they meet the hand until your hand is making an upside-down L shape. Your fingers should form a tabletop. 3. Hold this position for a few seconds. 4. Extend your fingers out to their original position, all pointing straight up again.  Making Os Repeat this exercise 5-10 times with each hand. 1. Stand or sit with your arm, hand, and all five fingers pointed straight up. Make sure your wrist is straight. 2. Make an O shape by touching your pointer finger to your thumb. Hold for a few seconds. Then open your hand wide. 3. Repeat this motion with each finger on your hand.  Table Spread Repeat this exercise 5-10 times with each hand. 1. Place your hand on a table with your palm facing down. Make sure your wrist is straight. 2. Spread your fingers out as much as possible. Hold this position for a few seconds. 3. Slide your fingers back together again. Hold for a few seconds.  Ball Grip  Repeat this exercise 10-15 times with each hand. 1. Hold a tennis ball or another soft ball in your hand. 2. While slowly increasing pressure, squeeze the ball as hard as possible. 3. Squeeze as hard as you can for 3-5 seconds. 4. Relax and repeat.  Wrist Curls Repeat this exercise 10-15 times with each hand. 1. Sit in a chair that has armrests. 2. Hold a light weight in your hand, such as a dumbbell that weighs 1-3 pounds (0.5-1.4 kg). Ask your health care provider what weight would be best for  you. 3. Rest your hand just over the end of the chair arm with your palm facing up. 4. Gently pivot your wrist up and down while holding the weight. Do not twist your wrist from side to side.  Contact a health care provider if:  Your hand pain or discomfort gets much worse when you do an exercise.  Your hand pain or discomfort does not improve within 2 hours after you exercise. If you have any of these problems, stop doing these exercises right away. Do not do them again unless your health care provider says that you can. Get help right away if:  You develop sudden, severe hand pain. If this happens, stop doing these exercises right away. Do not do them again unless your health care provider says that you can. This information is not intended to replace advice given to you by your health care provider. Make sure you discuss any questions you have with your health care provider. Document Released: 05/20/2015 Document Revised: 11/14/2015 Document Reviewed: 12/17/2014 Elsevier Interactive Patient Education  [employer].

## 2017-07-15 ENCOUNTER — Other Ambulatory Visit: Payer: Self-pay | Admitting: Family Medicine

## 2017-07-15 DIAGNOSIS — M25561 Pain in right knee: Secondary | ICD-10-CM

## 2017-07-15 DIAGNOSIS — M25562 Pain in left knee: Secondary | ICD-10-CM

## 2017-07-15 DIAGNOSIS — M797 Fibromyalgia: Secondary | ICD-10-CM

## 2017-07-15 DIAGNOSIS — M545 Low back pain, unspecified: Secondary | ICD-10-CM

## 2017-07-15 DIAGNOSIS — G8929 Other chronic pain: Secondary | ICD-10-CM

## 2017-07-15 NOTE — Telephone Encounter (Signed)
Okay refill x 3 months.

## 2017-07-15 NOTE — Telephone Encounter (Signed)
Please advise 

## 2017-07-16 NOTE — Telephone Encounter (Signed)
Patient called and said the pharmacy advised her the refill request is not at the pharmacy

## 2017-07-16 NOTE — Telephone Encounter (Signed)
Please advise 

## 2017-07-26 ENCOUNTER — Other Ambulatory Visit: Payer: Self-pay

## 2017-07-26 MED ORDER — LEVOTHYROXINE SODIUM 100 MCG PO TABS: 100.0000 ug | ORAL_TABLET | Freq: Every day | ORAL | 1 refills | Status: DC

## 2017-07-26 MED ORDER — QUINAPRIL-HYDROCHLOROTHIAZIDE 20-12.5 MG PO TABS: 1.0000 | ORAL_TABLET | Freq: Every day | ORAL | 0 refills | Status: DC

## 2017-07-27 ENCOUNTER — Other Ambulatory Visit: Payer: Self-pay

## 2017-07-27 ENCOUNTER — Telehealth: Payer: Self-pay | Admitting: Family Medicine

## 2017-07-27 MED ORDER — LEVOTHYROXINE SODIUM 100 MCG PO TABS: 100.0000 ug | ORAL_TABLET | Freq: Every day | ORAL | 1 refills | Status: DC

## 2017-07-27 MED ORDER — QUINAPRIL-HYDROCHLOROTHIAZIDE 20-12.5 MG PO TABS: 1.0000 | ORAL_TABLET | Freq: Every day | ORAL | 0 refills | Status: DC

## 2017-07-27 NOTE — Telephone Encounter (Signed)
Please advise 

## 2017-07-27 NOTE — Telephone Encounter (Signed)
Resent to right pharmacy and called patient and let her know.

## 2017-07-27 NOTE — Telephone Encounter (Signed)
Copied from [place] [id number]. Topic: Quick Communication - See Telephone Encounter >> Jul 27, 2017 11:14 AM Bea Graff, NT wrote: CRM for notification. See Telephone encounter for: Pts rx for  quinapril-hydrochlorothiazide (ACCURETIC) and  levothyroxine (SYNTHROID, LEVOTHROID that was called in yesterday needed to be sent to [facility] not [facility] in [place]. Pt calling to see if this can be changed.   07/27/17.

## 2017-08-02 ENCOUNTER — Other Ambulatory Visit: Payer: Self-pay | Admitting: Family Medicine

## 2017-08-02 DIAGNOSIS — E2839 Other primary ovarian failure: Secondary | ICD-10-CM

## 2017-08-03 ENCOUNTER — Ambulatory Visit
Admission: RE | Admit: 2017-08-03 | Discharge: 2017-08-03 | Disposition: A | Discharge status: Home / Self Care | Payer: Managed Care, Other (non HMO) | Source: Ambulatory Visit | Attending: Family Medicine | Admitting: Family Medicine

## 2017-08-03 ENCOUNTER — Inpatient Hospital Stay: Admission: RE | Admit: 2017-08-03 | Payer: 59 | Source: Ambulatory Visit

## 2017-08-03 ENCOUNTER — Ambulatory Visit
Admission: RE | Admit: 2017-08-03 | Discharge: 2017-08-03 | Disposition: A | Discharge status: Home / Self Care | Payer: 59 | Source: Ambulatory Visit | Attending: Family Medicine | Admitting: Family Medicine

## 2017-08-03 DIAGNOSIS — Z1231 Encounter for screening mammogram for malignant neoplasm of breast: Secondary | ICD-10-CM

## 2017-08-03 DIAGNOSIS — E2839 Other primary ovarian failure: Secondary | ICD-10-CM

## 2017-08-16 NOTE — Progress Notes (Signed)
Erika Osborn is a 67 y.o. female is here for follow up.  History of Present Illness:   HPI:   1. Polyarthralgia. Rx Tramadol. Patient states that the tramadol takes edge off but would like to know if their is anything in addition to it she can take.    2. Fibromyalgia. Last notes with Rheumatology and Sports Medicine reviewed.   3. Mixed hyperlipidemia. Not on statin. Patient preferred to try red yeast rice and diet changes first. Due for recheck.    4. Overweight (BMI 25.0-29.9).   Wt Readings from Last 3 Encounters:  08/20/17 137 lb (62.1 kg)  08/17/17 137 lb 9.6 oz (62.4 kg)  07/09/17 136 lb (61.7 kg)    5. Hypothyroidism, postradioiodine therapy.   Lab Results  Component Value Date   TSH 0.22 (L) 08/17/2017     Also, she has additional complaints of tenderness at temple area around veins. Started about a week ago. States that they are darker than ever have been in past. Congestion: nasal congestion with post nasal drip. No cough.     Review of Systems  Constitutional: Negative for chills, fever and weight loss.  HENT: Positive for congestion. Negative for ear pain and hearing loss.   Eyes: Negative for blurred vision and double vision.  Respiratory: Negative for cough.   Cardiovascular: Negative for chest pain, palpitations and leg swelling.  Gastrointestinal: Negative for heartburn and nausea.  Genitourinary: Negative for dysuria and urgency.  Musculoskeletal: Negative for myalgias.  Neurological: Negative for dizziness and headaches.  Endo/Heme/Allergies: Does not bruise/bleed easily.   Health Maintenance Due  Topic Date Due  . COLONOSCOPY  12/15/1999   Depression screen [facility] 2/9 08/17/2017 08/08/2016 01/24/2016  Decreased Interest 3 0 0  Down, Depressed, Hopeless 1 0 0  PHQ - 2 Score 4 0 0  Altered sleeping 3 - -  Tired, decreased energy 3 - -  Change in appetite 2 - -  Feeling bad or failure about yourself  0 - -  Trouble concentrating 0 - -  Moving slowly  or fidgety/restless 0 - -  Suicidal thoughts 0 - -  PHQ-9 Score 12 - -   PMHx, SurgHx, SocialHx, FamHx, Medications, and Allergies were reviewed in the Visit Navigator and updated as appropriate.   Patient Active Problem List   Diagnosis Date Noted  . Elevated MCV 03/28/2017  . Elevated LFTs 03/28/2017  . Primary osteoarthritis of both hands 01/28/2017  . ANA positive 01/26/2017  . Positive anti-CCP test 11/30/2016  . Chronic bilateral low back pain with bilateral sciatica 11/16/2016  . Osteoarthritis of spine with radiculopathy, lumbar region 11/16/2016  . Polyarthralgia 11/16/2016  . Primary osteoarthritis of both knees 11/16/2016  . Onychomycosis 11/22/2015  . Overweight (BMI 25.0-29.9) 06/25/2015  . 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 Allergic conjunctivitis and rhinitis 01/17/2014  . 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 Fibromyalgia 07/19/2013  . HTN (hypertension) 07/19/2013  . HLD (hyperlipidemia) 07/19/2013  . Ulcerative colitis ([place]) 07/19/2013   Social History   Tobacco Use  . Smoking status: Never Smoker  . Smokeless tobacco: Never Used  Substance Use Topics  . Alcohol use: No  . Drug use: No   Current Medications and Allergies:   .  acetaminophen (TYLENOL) 650 MG CR tablet, Take 1,300 mg by mouth daily as needed for pain., Disp: , Rfl:  .  azaTHIOprine (IMURAN) 50 MG tablet, Take 148m by mouth daily, Disp: , Rfl: 5 .  Bioflavonoid Products (GRAPE SEED PO), Take  200 mg by mouth daily., Disp: , Rfl:  .  cetirizine (ZYRTEC) 10 MG tablet, Take 10 mg by mouth daily., Disp: , Rfl:  .  Cholecalciferol (VITAMIN D3) 5000 units CAPS, Take 5,000 Units by mouth daily., Disp: , Rfl:  .  folic acid (FOLVITE) 572 MCG tablet, Take 400 mcg by mouth daily., Disp: , Rfl:  .  levothyroxine (SYNTHROID, LEVOTHROID) 100 MCG tablet, Take 1 tablet (100 mcg total) by mouth at bedtime., Disp: 90 tablet, Rfl: 1 .  mesalamine (LIALDA) 1.2 g EC tablet, Take 4.8 g by mouth daily  with breakfast., Disp: , Rfl:  .  Misc Natural Products (TART CHERRY ADVANCED) CAPS, Take 839m by mouth once daily, Disp: , Rfl:  .  Multiple Vitamins-Minerals (MULTIVITAMIN & MINERAL PO), Take 1 tablet by mouth daily. , Disp: , Rfl:  .  quinapril-hydrochlorothiazide (ACCURETIC) 20-12.5 MG tablet, Take 1 tablet by mouth daily., Disp: 90 tablet, Rfl: 0 .  ranitidine (ZANTAC) 150 MG capsule, Take 150 mg by mouth 2 (two) times daily. , Disp: , Rfl:  .  Red Yeast Rice Extract (RED YEAST RICE PO), Take by mouth daily., Disp: , Rfl:  .  Soft Lens Products (OPTI-FREE PUREMOIST REWETTING) SOLN, 1 drop by Does not apply route daily as needed (dry eyes)., Disp: , Rfl:  .  traMADol (ULTRAM) 50 MG tablet, 2 po TID, Disp: 240 tablet, Rfl: 2 .  TURMERIC PO, Take 2 capsules by mouth daily. With ginger, Disp: , Rfl:  .  fluticasone (FLONASE) 50 MCG/ACT nasal spray, PLACE 2 SPRAYS INTO BOTH NOSTRILS DAILY, Disp: 16 g, Rfl: 1 .  potassium chloride SA (K-DUR,KLOR-CON) 20 MEQ tablet, Take 1 tablet (20 mEq total) by mouth 2 (two) times daily., Disp: 60 tablet, Rfl: 0  Allergies  Allergen Reactions  . Humira [Adalimumab] Shortness Of Breath    Body feels like its on fire and turns red  . Remicade [Infliximab] Shortness Of Breath    Body feels like its on fire and turns red  . Penicillins Rash    Childhood allergy PATIENT HAS HAD A PCN REACTION WITH IMMEDIATE RASH, FACIAL/TONGUE/THROAT SWELLING, SOB, OR LIGHTHEADEDNESS WITH HYPOTENSION:  #  #  #  YES  #  #  #   Has patient had a PCN reaction causing severe rash involving mucus membranes or skin necrosis: No Has patient had a PCN reaction that required hospitalization: No Has patient had a PCN reaction occurring within the last 10 years: No If all of the above answers are "NO", then may proceed with Cephalosporin use.   . Baclofen Other (See Comments)    confusion  . Erythromycin Rash  . Latex Rash    Skin redness    Review of Systems   Pertinent items  are noted in the HPI. Otherwise, ROS is negative.  Vitals:   Vitals:   08/17/17 1044  BP: 134/82  Pulse: 82  Temp: 98 F (36.7 C)  TempSrc: Oral  SpO2: 96%  Weight: 137 lb 9.6 oz (62.4 kg)     Body mass index is 26.87 kg/m.   Physical Exam:   Physical Exam  Constitutional: She is oriented to person, place, and time. She appears well-developed and well-nourished. No distress.  HENT:  Head: Normocephalic and atraumatic.  Right Ear: External ear normal.  Left Ear: External ear normal.  Nose: Right sinus exhibits maxillary sinus tenderness and frontal sinus tenderness.  Mouth/Throat: Oropharynx is clear and moist.  Eyes: Conjunctivae and EOM are normal. Pupils  are equal, round, and reactive to light.  Neck: Normal range of motion. Neck supple. No thyromegaly present.  Cardiovascular: Normal rate, regular rhythm, normal heart sounds and intact distal pulses.  Pulmonary/Chest: Effort normal and breath sounds normal.  Abdominal: Soft. Bowel sounds are normal.  Musculoskeletal: Normal range of motion.  Lymphadenopathy:    She has no cervical adenopathy.  Neurological: She is alert and oriented to person, place, and time.  Skin: Skin is warm and dry. Capillary refill takes less than 2 seconds.  Psychiatric: She has a normal mood and affect. Her behavior is normal.  Nursing note and vitals reviewed.   Results for orders placed or performed in visit on 08/17/17  Comprehensive metabolic panel  Result Value Ref Range   Sodium 140 135 - 145 mEq/L   Potassium 3.4 (L) 3.5 - 5.1 mEq/L   Chloride 100 96 - 112 mEq/L   CO2 34 (H) 19 - 32 mEq/L   Glucose, Bld 82 70 - 99 mg/dL   BUN 13 6 - 23 mg/dL   Creatinine, Ser 0.55 0.40 - 1.20 mg/dL   Total Bilirubin 0.5 0.2 - 1.2 mg/dL   Alkaline Phosphatase 81 39 - 117 U/L   AST 19 0 - 37 U/L   ALT 15 0 - 35 U/L   Total Protein 7.7 6.0 - 8.3 g/dL   Albumin 4.3 3.5 - 5.2 g/dL   Calcium 10.1 8.4 - 10.5 mg/dL   GFR 116.94 >60.00 mL/min  TSH    Result Value Ref Range   TSH 0.22 (L) 0.35 - 4.50 uIU/mL  T4, free  Result Value Ref Range   Free T4 1.04 0.60 - 1.60 ng/dL  Hepatitis C antibody  Result Value Ref Range   Hepatitis C Ab NON-REACTIVE NON-REACTI   SIGNAL TO CUT-OFF 0.02 <1.00   Assessment and Plan:   Erika Osborn was seen today for follow-up.  Diagnoses and all orders for this visit:  Fibromyalgia Comments: Okay to continue Tramadol.. Other modalities reviewed - exercise, stress reduction, Cymbalta, topical treatments, [employer] fibromyalgia class.  For the patient lumbar radicular pain and findings of stenosis on MRI, I am referring her to pain management.  I do wonder if Nucynta would be helpful for her.  We discussed the possibility of a trial of baclofen and the patient would like to try this.  Low-dose given with red flags discussed.  Orders: -     baclofen (LIORESAL) 10 MG tablet; Take 1 tablet (10 mg total) by mouth 3 (three) times daily. -     traMADol (ULTRAM) 50 MG tablet; 2 po TID  Mixed hyperlipidemia Comments: Patient is due for a recheck of her lipid panel but is not fasting today.  This will be ordered for future.  Orders: -     Comprehensive metabolic panel -     Lipid panel; Future  Overweight (BMI 25.0-29.9) Comments: We discussed the importance of maintaining a healthy BMI for pain control.  Hypothyroidism, postradioiodine therapy Comments: Due for TSH and free T4 rechecked today.  Orders: -     TSH -     T4, free  Encounter for hepatitis C virus screening test for high risk patient -     Hepatitis C antibody  Need for pneumococcal vaccination -     Pneumococcal polysaccharide vaccine 23-valent greater than or equal to 2yo subcutaneous/IM  Subacute frontal sinusitis Comments: Patient states that she is done well on a Z-Pak before.  Physical as an anti-inflammatory effect as well.  Orders: -     azithromycin (ZITHROMAX) 250 MG tablet; 2 PO ON DAY ONE AND THEN ONE PO EACH DAY UNTIL  GONE  Chronic pain of both knees Comments: She is now also followed by Dr. Paulla Fore.  Orders: -     traMADol (ULTRAM) 50 MG tablet; 2 po TID  Chronic bilateral low back pain with bilateral sciatica Comments: We reviewed the patient's last MRI showing signs of stenosis.  I will go ahead and refer to pain management.  Orders: -     Ambulatory referral to Physical Medicine Rehab -     traMADol (ULTRAM) 50 MG tablet; 2 po TID   . Reviewed expectations re: course of current medical issues. . Discussed self-management of symptoms. . Outlined signs and symptoms indicating need for more acute intervention. . Patient verbalized understanding and all questions were answered. Marland Kitchen Health Maintenance issues including appropriate healthy diet, exercise, and smoking avoidance were discussed with patient. . See orders for this visit as documented in the electronic medical record. . Patient received an After Visit Summary.  Briscoe Deutscher, DO , [facility] 08/21/2017  Future Appointments  Date Time Provider [place]  09/03/2017  9:30 AM Magnus Sinning, MD [facility] None  09/06/2017  1:00 PM [name], DO [facility]  07/11/2018 10:15 AM Bo Merino, MD [facility] None

## 2017-08-17 ENCOUNTER — Other Ambulatory Visit: Payer: Self-pay | Admitting: Family Medicine

## 2017-08-17 ENCOUNTER — Ambulatory Visit: Payer: Managed Care, Other (non HMO) | Admitting: Family Medicine

## 2017-08-17 VITALS — BP 134/82 | HR 82 | Temp 98.0°F | Wt 137.6 lb

## 2017-08-17 DIAGNOSIS — Z23 Encounter for immunization: Secondary | ICD-10-CM

## 2017-08-17 DIAGNOSIS — M25562 Pain in left knee: Secondary | ICD-10-CM

## 2017-08-17 DIAGNOSIS — M2550 Pain in unspecified joint: Secondary | ICD-10-CM | POA: Diagnosis not present

## 2017-08-17 DIAGNOSIS — E890 Postprocedural hypothyroidism: Secondary | ICD-10-CM

## 2017-08-17 DIAGNOSIS — Z1159 Encounter for screening for other viral diseases: Secondary | ICD-10-CM

## 2017-08-17 DIAGNOSIS — E663 Overweight: Secondary | ICD-10-CM

## 2017-08-17 DIAGNOSIS — M5441 Lumbago with sciatica, right side: Secondary | ICD-10-CM

## 2017-08-17 DIAGNOSIS — J0110 Acute frontal sinusitis, unspecified: Secondary | ICD-10-CM | POA: Diagnosis not present

## 2017-08-17 DIAGNOSIS — M25561 Pain in right knee: Secondary | ICD-10-CM | POA: Diagnosis not present

## 2017-08-17 DIAGNOSIS — R0981 Nasal congestion: Secondary | ICD-10-CM

## 2017-08-17 DIAGNOSIS — G8929 Other chronic pain: Secondary | ICD-10-CM | POA: Diagnosis not present

## 2017-08-17 DIAGNOSIS — E782 Mixed hyperlipidemia: Secondary | ICD-10-CM | POA: Diagnosis not present

## 2017-08-17 DIAGNOSIS — Z9189 Other specified personal risk factors, not elsewhere classified: Secondary | ICD-10-CM | POA: Diagnosis not present

## 2017-08-17 DIAGNOSIS — M797 Fibromyalgia: Secondary | ICD-10-CM

## 2017-08-17 DIAGNOSIS — M5442 Lumbago with sciatica, left side: Secondary | ICD-10-CM | POA: Diagnosis not present

## 2017-08-17 LAB — COMPREHENSIVE METABOLIC PANEL
ALT: 15 U/L (ref 0–35)
AST: 19 U/L (ref 0–37)
Albumin: 4.3 g/dL (ref 3.5–5.2)
Alkaline Phosphatase: 81 U/L (ref 39–117)
BUN: 13 mg/dL (ref 6–23)
CO2: 34 mEq/L — ABNORMAL HIGH (ref 19–32)
Calcium: 10.1 mg/dL (ref 8.4–10.5)
Chloride: 100 mEq/L (ref 96–112)
Creatinine, Ser: 0.55 mg/dL (ref 0.40–1.20)
GFR: 116.94 mL/min (ref >60.00)
Glucose, Bld: 82 mg/dL (ref 70–99)
Potassium: 3.4 mEq/L — ABNORMAL LOW (ref 3.5–5.1)
Sodium: 140 mEq/L (ref 135–145)
Total Bilirubin: 0.5 mg/dL (ref 0.2–1.2)
Total Protein: 7.7 g/dL (ref 6.0–8.3)

## 2017-08-17 LAB — TSH: TSH: 0.22 u[IU]/mL — ABNORMAL LOW (ref 0.35–4.50)

## 2017-08-17 LAB — T4, FREE: Free T4: 1.04 ng/dL (ref 0.60–1.60)

## 2017-08-17 MED ORDER — BACLOFEN 10 MG PO TABS: 10.0000 mg | ORAL_TABLET | Freq: Three times a day (TID) | ORAL | 0 refills | Status: DC

## 2017-08-17 MED ORDER — TRAMADOL HCL 50 MG PO TABS: ORAL_TABLET | ORAL | 2 refills | Status: DC

## 2017-08-17 MED ORDER — AZITHROMYCIN 250 MG PO TABS: ORAL_TABLET | ORAL | 0 refills | Status: DC

## 2017-08-17 NOTE — Patient Instructions (Signed)
Spoke with Dr. Paulla Fore and he  agrees that we should send you to Dr. Ernestina Patches.  I put that referral in today.  Expect a phone call within the next 1-3 weeks.  Let me know if they have not called you within that time.  Look at the medications Cymbalta and Effexor for pain management.

## 2017-08-18 LAB — HEPATITIS C ANTIBODY
Hepatitis C Ab: NONREACTIVE
SIGNAL TO CUT-OFF: 0.02 (ref <1.00)

## 2017-08-19 ENCOUNTER — Ambulatory Visit: Payer: Self-pay | Admitting: *Deleted

## 2017-08-19 NOTE — Telephone Encounter (Signed)
Spoke with Roselyn Reef at [facility] and she states that Dr [name] is gone for the day, and that she will pass this request on when Dr [name] returns in the morning;Jamie also states that she can not tell the pt to change her medication dosage and it would be at the pt's discretion; called pt and relayed this information to her; the pt verbalizes understanding and is awaiting tomorrow's  phone call from office

## 2017-08-19 NOTE — Telephone Encounter (Signed)
See note

## 2017-08-19 NOTE — Telephone Encounter (Addendum)
Pt started taking baclofen on Tuesday evening (10 mg); on Wednesday she started shakiness(tremors) and more problems sleeping; she states that she did not take her morning or afternoon dose today; the pt would like to know if she can take 1 (one) 10 mg tablet before bed?; the the pt can be contacted at [phone]; will route to [facility] spoke with [place].   Reason for Disposition . Caller has URGENT medication question about med that PCP prescribed and triager unable to answer question  Answer Assessment - Initial Assessment Questions 1. SYMPTOMS: "Do you have any symptoms?"     Shakiness (tremors) all over her body 2. SEVERITY: If symptoms are present, ask "Are they mild, moderate or severe?"     moderate  Protocols used: MEDICATION QUESTION CALL-A-AH

## 2017-08-20 ENCOUNTER — Emergency Department (HOSPITAL_COMMUNITY): Payer: Managed Care, Other (non HMO)

## 2017-08-20 ENCOUNTER — Telehealth: Payer: Self-pay | Admitting: Family Medicine

## 2017-08-20 ENCOUNTER — Emergency Department (HOSPITAL_COMMUNITY)
Admission: EM | Admit: 2017-08-20 | Discharge: 2017-08-20 | Disposition: A | Discharge status: Home / Self Care | Payer: Managed Care, Other (non HMO) | Attending: Emergency Medicine | Admitting: Emergency Medicine

## 2017-08-20 ENCOUNTER — Encounter (HOSPITAL_COMMUNITY): Payer: Self-pay | Admitting: Emergency Medicine

## 2017-08-20 DIAGNOSIS — E039 Hypothyroidism, unspecified: Secondary | ICD-10-CM | POA: Insufficient documentation

## 2017-08-20 DIAGNOSIS — E348 Other specified endocrine disorders: Secondary | ICD-10-CM

## 2017-08-20 DIAGNOSIS — Z91040 Latex allergy status: Secondary | ICD-10-CM | POA: Diagnosis not present

## 2017-08-20 DIAGNOSIS — T887XXA Unspecified adverse effect of drug or medicament, initial encounter: Secondary | ICD-10-CM | POA: Diagnosis not present

## 2017-08-20 DIAGNOSIS — F329 Major depressive disorder, single episode, unspecified: Secondary | ICD-10-CM | POA: Diagnosis not present

## 2017-08-20 DIAGNOSIS — E876 Hypokalemia: Secondary | ICD-10-CM | POA: Diagnosis not present

## 2017-08-20 DIAGNOSIS — Y829 Unspecified medical devices associated with adverse incidents: Secondary | ICD-10-CM | POA: Diagnosis not present

## 2017-08-20 DIAGNOSIS — I10 Essential (primary) hypertension: Secondary | ICD-10-CM | POA: Insufficient documentation

## 2017-08-20 DIAGNOSIS — L03114 Cellulitis of left upper limb: Secondary | ICD-10-CM

## 2017-08-20 DIAGNOSIS — T50905A Adverse effect of unspecified drugs, medicaments and biological substances, initial encounter: Secondary | ICD-10-CM

## 2017-08-20 DIAGNOSIS — T502X5A Adverse effect of carbonic-anhydrase inhibitors, benzothiadiazides and other diuretics, initial encounter: Secondary | ICD-10-CM | POA: Diagnosis not present

## 2017-08-20 DIAGNOSIS — Z79899 Other long term (current) drug therapy: Secondary | ICD-10-CM | POA: Insufficient documentation

## 2017-08-20 DIAGNOSIS — M79622 Pain in left upper arm: Secondary | ICD-10-CM | POA: Diagnosis present

## 2017-08-20 LAB — BASIC METABOLIC PANEL
Anion gap: 12 (ref 5–15)
BUN: 11 mg/dL (ref 6–20)
CALCIUM: 9.4 mg/dL (ref 8.9–10.3)
CO2: 26 mmol/L (ref 22–32)
Chloride: 100 mmol/L — ABNORMAL LOW (ref 101–111)
Creatinine, Ser: 0.61 mg/dL (ref 0.44–1.00)
GFR calc Af Amer: >60 mL/min (ref >60)
Glucose, Bld: 154 mg/dL — ABNORMAL HIGH (ref 65–99)
POTASSIUM: 2.9 mmol/L — AB (ref 3.5–5.1)
SODIUM: 138 mmol/L (ref 135–145)

## 2017-08-20 LAB — CBC
HCT: 35.2 % — ABNORMAL LOW (ref 36.0–46.0)
Hemoglobin: 11.8 g/dL — ABNORMAL LOW (ref 12.0–15.0)
MCH: 33.5 pg (ref 26.0–34.0)
MCHC: 33.5 g/dL (ref 30.0–36.0)
MCV: 100 fL (ref 78.0–100.0)
PLATELETS: 298 10*3/uL (ref 150–400)
RBC: 3.52 MIL/uL — AB (ref 3.87–5.11)
RDW: 15.9 % — AB (ref 11.5–15.5)
WBC: 5.5 10*3/uL (ref 4.0–10.5)

## 2017-08-20 MED ORDER — LORAZEPAM 1 MG PO TABS
1.0000 mg | ORAL_TABLET | Freq: Once | ORAL | Status: AC
  Administered 2017-08-20: 1 mg via ORAL
  Filled 2017-08-20: qty 1

## 2017-08-20 MED ORDER — CEPHALEXIN 250 MG PO CAPS
500.0000 mg | ORAL_CAPSULE | Freq: Once | ORAL | Status: AC
  Administered 2017-08-20: 500 mg via ORAL
  Filled 2017-08-20: qty 2

## 2017-08-20 MED ORDER — POTASSIUM CHLORIDE CRYS ER 20 MEQ PO TBCR
40.0000 meq | EXTENDED_RELEASE_TABLET | Freq: Once | ORAL | Status: AC
  Administered 2017-08-20: 40 meq via ORAL
  Filled 2017-08-20: qty 2

## 2017-08-20 MED ORDER — POTASSIUM CHLORIDE CRYS ER 20 MEQ PO TBCR: 20.0000 meq | EXTENDED_RELEASE_TABLET | Freq: Two times a day (BID) | ORAL | 0 refills | Status: DC

## 2017-08-20 MED ORDER — CEPHALEXIN 500 MG PO CAPS: 500.0000 mg | ORAL_CAPSULE | Freq: Four times a day (QID) | ORAL | 0 refills | Status: DC

## 2017-08-20 NOTE — ED Triage Notes (Addendum)
Pt reports L shoulder pain since getting PNA shot on Tuesday. Pt has red abscess to L axilla. Taking azithromax and baclofen. Family is reporting confusion. Pt alert x4 in triage but has trouble recalling facts.

## 2017-08-20 NOTE — Telephone Encounter (Signed)
FYI

## 2017-08-20 NOTE — ED Notes (Signed)
Pt requesting to take home dose of Tramadol. MD Roxanne Mins stated that was ok. Pt took 164m of home dose of Tramadol for chronic rt leg pain.

## 2017-08-20 NOTE — Telephone Encounter (Signed)
Her MRI results:  IMPRESSION: 1. No acute intracranial finding including infarct. 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 Recommend nonemergent postcontrast MRI. 3. 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 4. Right maxillary sinusitis with acute features.  ED doc changed Zpak to Keflex. That is appropriate. Maxillary sinusitis was seen on MRI, confirming our suspicion. The cyst should be followed up and I am happy to order another MRI. Will enter into chart as future - likely to happen early next week.   Okay to hold baclofen and add to allergy list.

## 2017-08-20 NOTE — ED Notes (Signed)
Pt remains in MRI during hourly rounds.

## 2017-08-20 NOTE — Telephone Encounter (Signed)
Copied from [place] [id number]. Topic: Inquiry >> Aug 20, 2017 11:29 AM Scherrie Gerlach wrote: Reason for CRM: pt calling back today to inform the dr of what happened at the ED:  Pt went to the ED as instructed yesterday as instructed. Pt states they found a cyst on her pineal gland. The radiologist suggested she have another MRI with IV contrast, and her pcp can do this. Pt not sure what she needs to do next.  Pt also states the ED doctor put her cephALEXin (KEFLEX) 500 MG capsule and instructed her to stop what dr [name] had prescribed.  Pt declined to make an appointment, would like to know what Dr [name] would have her due next.  Pt instructed to stop the baclofen (LIORESAL) 10 MG tablet as well, because may have attributed to pt's confusion. Pt would like to hear back from Dr [name] to see what next step should be.

## 2017-08-20 NOTE — ED Notes (Signed)
Patient denies pain and is resting comfortably.  

## 2017-08-20 NOTE — Discharge Instructions (Signed)
Stop taking lioresal (Baclofen) - it is probably what is making you confused.  If the red area on your arm is spreading, or if you start running a fever, then return to the Emergency Department.  Your MRI showed a cyst of your pineal gland. The radiologist recommends you have another MRI - this time with IV contrast. Your primary care provider can arrange this.

## 2017-08-20 NOTE — Telephone Encounter (Signed)
Info reviewed this am. Patient seen in ED. Concern for lack of documentation.

## 2017-08-20 NOTE — ED Notes (Signed)
Pt returned from MRI °

## 2017-08-20 NOTE — Telephone Encounter (Signed)
I am forwarding message to Dr. [name]. I have not spoke with th patient and do not know any of the information.

## 2017-08-20 NOTE — ED Provider Notes (Signed)
[facility] Provider Note   CSN: [id number] Arrival date & time: 08/20/17  0020     History   Chief Complaint Chief Complaint  Patient presents with  . Shoulder Pain    HPI Erika Osborn is a 67 y.o. female.  The history is provided by the patient.  She has history of hypertension, hypothyroidism, fibromyalgia and comes in because of pain in her left upper arm and some difficulty speaking.  She relates that 2 days ago she had seen her physician and received pneumonia vaccination in her left arm and also was given prescriptions for azithromycin for sinus infection and Lioresal for back spasms.  She took off the medication that day, but yesterday, noted that her thinking was foggy and was having difficulty with getting her thoughts from her head to her mouth.  She is also noticed some increased pain in her left shoulder and noted a red area in the medial aspect of the left upper arm.  She has not had fever or chills or sweats.  Family member had noted that she also seemed to be confused.  There is been no cough, nausea, vomiting, diarrhea and no urinary difficulty.  No other recent medication changes.  Past Medical History:  Diagnosis Date  . Arthritis   . Chronic back pain   . Complication of anesthesia    increase blood pressure for not taking meds  . Depression   . Fibromyalgia   . GERD (gastroesophageal reflux disease)   . Headache   . HLD (hyperlipidemia)   . HTN (hypertension)   . Hypothyroidism following radioiodine therapy   . Pneumonia    h/o pneumonia yrs. ago  . Ulcerative colitis ([place]) 1976    Patient Active Problem List   Diagnosis Date Noted  . Elevated MCV 03/28/2017  . Elevated LFTs 03/28/2017  . Primary osteoarthritis of both hands 01/28/2017  . ANA positive 01/26/2017  . Positive anti-CCP test 11/30/2016  . Chronic bilateral low back pain with bilateral sciatica 11/16/2016  . Osteoarthritis of spine with  radiculopathy, lumbar region 11/16/2016  . Polyarthralgia 11/16/2016  . Primary osteoarthritis of both knees 11/16/2016  . Onychomycosis 11/22/2015  . Overweight (BMI 25.0-29.9) 06/25/2015  . 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 Allergic conjunctivitis and rhinitis 01/17/2014  . 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 Fibromyalgia 07/19/2013  . HTN (hypertension) 07/19/2013  . HLD (hyperlipidemia) 07/19/2013  . Ulcerative colitis ([place]) 07/19/2013    Past Surgical History:  Procedure Laterality Date  . APPENDECTOMY    . BREAST REDUCTION SURGERY    . BREAST REDUCTION SURGERY Bilateral 06/22/2005  . COLONOSCOPY W/ BIOPSIES AND POLYPECTOMY    . LUMBAR DISC SURGERY    . RADIOLOGY WITH ANESTHESIA N/A 03/30/2017   Procedure: RADIOLOGY WITH ANESTHESIA-LUMBAR SPINE WITHOUT CONTRAST;  Surgeon: Radiologist, Medication, MD;  Location: [facility];  Service: Radiology;  Laterality: N/A;  . REDUCTION MAMMAPLASTY    . TONSILLECTOMY    . UTERINE SUSPENSION    . WISDOM TOOTH EXTRACTION      OB History    No data available       Home Medications    Prior to Admission medications   Medication Sig Start Date End Date Taking? Authorizing Provider  acetaminophen (TYLENOL) 650 MG CR tablet Take 1,300 mg by mouth daily as needed for pain.    [provider]  azaTHIOprine (IMURAN) 50 MG tablet Take 143m by mouth daily 08/21/16   [provider]  azithromycin ([facility]  250 MG tablet 2 PO ON DAY ONE AND THEN ONE PO EACH DAY UNTIL GONE 08/17/17   Briscoe Deutscher, DO  baclofen (LIORESAL) 10 MG tablet Take 1 tablet (10 mg total) by mouth 3 (three) times daily. 08/17/17   Briscoe Deutscher, DO  Bioflavonoid Products (GRAPE SEED PO) Take 200 mg by mouth daily.    [provider]  cetirizine (ZYRTEC) 10 MG tablet Take 10 mg by mouth daily.    [provider]  Cholecalciferol (VITAMIN D3) 5000 units CAPS Take 5,000 Units by mouth daily.    [provider]    fluticasone (FLONASE) 50 MCG/ACT nasal spray PLACE 2 SPRAYS INTO BOTH NOSTRILS DAILY 08/17/17   Briscoe Deutscher, DO  folic acid (FOLVITE) 678 MCG tablet Take 400 mcg by mouth daily.    [provider]  levothyroxine (SYNTHROID, LEVOTHROID) 100 MCG tablet Take 1 tablet (100 mcg total) by mouth at bedtime. 07/27/17   Briscoe Deutscher, DO  mesalamine (LIALDA) 1.2 g EC tablet Take 4.8 g by mouth daily with breakfast.    [provider]  Misc Natural Products (TART CHERRY ADVANCED) CAPS Take 839m by mouth once daily    [provider]  Multiple Vitamins-Minerals (MULTIVITAMIN & MINERAL PO) Take 1 tablet by mouth daily.     [provider]  quinapril-hydrochlorothiazide (ACCURETIC) 20-12.5 MG tablet Take 1 tablet by mouth daily. 07/27/17   WBriscoe Deutscher DO  ranitidine (ZANTAC) 150 MG capsule Take 150 mg by mouth 2 (two) times daily.     [provider]  Red Yeast Rice Extract (RED YEAST RICE PO) Take by mouth daily.    [provider]  Soft Lens Products (OPTI-FREE PUREMOIST REWETTING) SOLN 1 drop by Does not apply route daily as needed (dry eyes).    [provider]  traMADol (Veatrice Bourbon 50 MG tablet 2 po TID 08/17/17   WBriscoe Deutscher DO  TURMERIC PO Take 2 capsules by mouth daily. With ginger    [provider]    Family History Family History  Problem Relation Age of Onset  . Cancer Father   . Heart failure Mother   . Hyperlipidemia Mother   . Osteoporosis Mother   . Hyperlipidemia Brother   . Hyperlipidemia Sister     Social History Social History   Tobacco Use  . Smoking status: Never Smoker  . Smokeless tobacco: Never Used  Substance Use Topics  . Alcohol use: No  . Drug use: No     Allergies   Humira [adalimumab]; Remicade [infliximab]; Penicillins; Erythromycin; and Latex   Review of Systems Review of Systems  All other systems reviewed and are negative.    Physical Exam Updated Vital Signs BP (!)  151/73   Pulse 78   Temp 98.3 F (36.8 C) (Oral)   Resp 16   Ht 4' 11"  (1.499 m)   Wt 62.1 kg (137 lb)   SpO2 94%   BMI 27.67 kg/m   Physical Exam  Nursing note and vitals reviewed.  67year old female, resting comfortably and in no acute distress. Vital signs are significant for mild elevation of systolic blood pressure. Oxygen saturation is 94%, which is normal. Head is normocephalic and atraumatic. PERRLA, EOMI. Oropharynx is clear. Neck is nontender and supple without adenopathy or JVD. Back is nontender and there is no CVA tenderness. Lungs are clear without rales, wheezes, or rhonchi. Chest is nontender. Heart has regular rate and rhythm without murmur. Abdomen is soft, flat, nontender without masses  or hepatosplenomegaly and peristalsis is normoactive. Extremities have no cyanosis or edema, full range of motion is present.  Area of erythema noted on the medial aspect of the left upper arm-about 2.5 cm in diameter.  It is not warm to touch but it is mildly tender.  No lymphangitic streaks seen and no axillary adenopathy.  No swelling. Skin is warm and dry without rash. Neurologic: Mental status is normal, cranial nerves are intact, there are no motor or sensory deficits.  There is no pronator drift.  No difficulty with naming.  ED Treatments / Results  Labs (all labs ordered are listed, but only abnormal results are displayed) Labs Reviewed  CBC - Abnormal; Notable for the following components:      Result Value   RBC 3.52 (*)    Hemoglobin 11.8 (*)    HCT 35.2 (*)    RDW 15.9 (*)    All other components within normal limits  BASIC METABOLIC PANEL - Abnormal; Notable for the following components:   Potassium 2.9 (*)    Chloride 100 (*)    Glucose, Bld 154 (*)    All other components within normal limits   Radiology Mr Brain Wo Contrast  Result Date: 08/20/2017 CLINICAL DATA:  Speech difficulty EXAM: MRI HEAD WITHOUT CONTRAST TECHNIQUE: Multiplanar, multiecho pulse  sequences of the brain and surrounding structures were obtained without intravenous contrast. COMPARISON:  None. FINDINGS: Brain: No acute infarction, hemorrhage, hydrocephalus, extra-axial collection or atrophy. Mild to moderate FLAIR hyperintensity in the cerebral white matter, usually chronic small vessel ischemia given patient's vascular risk factors. 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 On FLAIR imaging there is mild internal complexity. On sagittal imaging the tectum is flattened. No hydrocephalus. Vascular: Major flow voids are preserved. Skull and upper cervical spine: Negative for marrow lesion Sinuses/Orbits: Right maxillary sinusitis with fluid level. IMPRESSION: 1. No acute intracranial finding including infarct. 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 Recommend nonemergent postcontrast MRI. 3. 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 4. Right maxillary sinusitis with acute features. Electronically Signed   By: Monte Fantasia M.D.   On: 08/20/2017 08:14    Procedures Procedures (including critical care time)  Medications Ordered in ED Medications  potassium chloride SA (K-DUR,KLOR-CON) CR tablet 40 mEq (40 mEq Oral Given 08/20/17 0547)  LORazepam (ATIVAN) tablet 1 mg (1 mg Oral Given 08/20/17 0621)  cephALEXin (KEFLEX) capsule 500 mg (500 mg Oral Given 08/20/17 0547)     Initial Impression / Assessment and Plan / ED Course  I have reviewed the triage vital signs and the nursing notes.  Pertinent labs & imaging results that were available during my care of the patient were reviewed by me and considered in my medical decision making (see chart for details).  Probable area of cellulitis left upper arm.  Old records are reviewed confirming prescription for Z-Pak 2 days ago and also started on Lioresal.  However, patient has not taken the aerosol for over 24 hours and difficulty with speech persists.  At this  point, I feel I do need to rule out small CVA.  No other findings of stroke present on exam.  She is started on cephalexin for her cellulitis.  She is also noted to be significantly hypokalemic (she is taking hydrochlorothiazide as part of a combination antihypertensive) and is given a dose of oral potassium.  MRI of the brain has been ordered.  She has difficulty with claustrophobia, so she is given a dose of lorazepam.  MRI shows no evidence of stroke, but pineal cyst is noted.  Radiologist recommends nonemergent scan after IV contrast.  This is explained to the patient.  Apparently, her altered mentation and speech was a side effect of her we are resolved.  She is advised to stop taking that and she is discharged with prescription for cephalexin for her cellulitis.  Also given prescription for K-Dur for her hypokalemia.  Recommended follow-up with PCP in 3 days to recheck her area of cellulitis.  Return precautions discussed.  Final Clinical Impressions(s) / ED Diagnoses   Final diagnoses:  Cellulitis of left upper arm  Pineal gland cyst  Medication side effect, initial encounter  Diuretic-induced hypokalemia    ED Discharge Orders        Ordered    cephALEXin (KEFLEX) 500 MG capsule  4 times daily     08/20/17 0825    potassium chloride SA (K-DUR,KLOR-CON) 20 MEQ tablet  2 times daily     29/93/71 [id number]       Delora Fuel, MD 89/38/10 [phone]

## 2017-08-20 NOTE — Telephone Encounter (Signed)
Called and spoke to patient and answered all questions. Baclofen added to allergy list.

## 2017-08-20 NOTE — ED Notes (Signed)
Patient being transported to MR at this time.

## 2017-08-21 ENCOUNTER — Encounter: Payer: Self-pay | Admitting: Family Medicine

## 2017-08-25 ENCOUNTER — Ambulatory Visit (INDEPENDENT_AMBULATORY_CARE_PROVIDER_SITE_OTHER): Payer: Managed Care, Other (non HMO) | Admitting: Family Medicine

## 2017-08-25 ENCOUNTER — Encounter: Payer: Self-pay | Admitting: Family Medicine

## 2017-08-25 VITALS — BP 140/78 | HR 63 | Temp 98.3°F | Ht 60.0 in | Wt 137.0 lb

## 2017-08-25 DIAGNOSIS — D649 Anemia, unspecified: Secondary | ICD-10-CM

## 2017-08-25 DIAGNOSIS — E782 Mixed hyperlipidemia: Secondary | ICD-10-CM

## 2017-08-25 DIAGNOSIS — G939 Disorder of brain, unspecified: Secondary | ICD-10-CM | POA: Diagnosis not present

## 2017-08-25 DIAGNOSIS — G9389 Other specified disorders of brain: Secondary | ICD-10-CM

## 2017-08-25 DIAGNOSIS — M797 Fibromyalgia: Secondary | ICD-10-CM

## 2017-08-25 DIAGNOSIS — L03112 Cellulitis of left axilla: Secondary | ICD-10-CM | POA: Diagnosis not present

## 2017-08-25 DIAGNOSIS — H6123 Impacted cerumen, bilateral: Secondary | ICD-10-CM

## 2017-08-25 DIAGNOSIS — E876 Hypokalemia: Secondary | ICD-10-CM | POA: Diagnosis not present

## 2017-08-25 LAB — COMPREHENSIVE METABOLIC PANEL
ALT: 12 U/L (ref 0–35)
AST: 18 U/L (ref 0–37)
Albumin: 4.4 g/dL (ref 3.5–5.2)
Alkaline Phosphatase: 83 U/L (ref 39–117)
BUN: 12 mg/dL (ref 6–23)
CO2: 36 mEq/L — ABNORMAL HIGH (ref 19–32)
Calcium: 10.3 mg/dL (ref 8.4–10.5)
Chloride: 99 mEq/L (ref 96–112)
Creatinine, Ser: 0.6 mg/dL (ref 0.40–1.20)
GFR: 105.76 mL/min (ref >60.00)
Glucose, Bld: 83 mg/dL (ref 70–99)
Potassium: 3.7 mEq/L (ref 3.5–5.1)
Sodium: 140 mEq/L (ref 135–145)
Total Bilirubin: 0.4 mg/dL (ref 0.2–1.2)
Total Protein: 7.6 g/dL (ref 6.0–8.3)

## 2017-08-25 LAB — LIPID PANEL
Cholesterol: 271 mg/dL — ABNORMAL HIGH (ref 0–200)
HDL: 52.6 mg/dL (ref >39.00)
LDL Cholesterol: 184 mg/dL — ABNORMAL HIGH (ref 0–99)
NonHDL: 218.74
Total CHOL/HDL Ratio: 5
Triglycerides: 176 mg/dL — ABNORMAL HIGH (ref 0.0–149.0)
VLDL: 35.2 mg/dL (ref 0.0–40.0)

## 2017-08-25 LAB — CBC WITH DIFFERENTIAL/PLATELET
Basophils Absolute: 0 10*3/uL (ref 0.0–0.1)
Basophils Relative: 0.9 % (ref 0.0–3.0)
Eosinophils Absolute: 0.2 10*3/uL (ref 0.0–0.7)
Eosinophils Relative: 3.1 % (ref 0.0–5.0)
HCT: 37.7 % (ref 36.0–46.0)
Hemoglobin: 13.1 g/dL (ref 12.0–15.0)
Lymphocytes Relative: 30.5 % (ref 12.0–46.0)
Lymphs Abs: 1.5 10*3/uL (ref 0.7–4.0)
MCHC: 34.8 g/dL (ref 30.0–36.0)
MCV: 100.3 fl — ABNORMAL HIGH (ref 78.0–100.0)
Monocytes Absolute: 0.3 10*3/uL (ref 0.1–1.0)
Monocytes Relative: 6.6 % (ref 3.0–12.0)
Neutro Abs: 2.9 10*3/uL (ref 1.4–7.7)
Neutrophils Relative %: 58.9 % (ref 43.0–77.0)
Platelets: 339 10*3/uL (ref 150.0–400.0)
RBC: 3.76 Mil/uL — ABNORMAL LOW (ref 3.87–5.11)
RDW: 16.2 % — ABNORMAL HIGH (ref 11.5–15.5)
WBC: 4.9 10*3/uL (ref 4.0–10.5)

## 2017-08-25 LAB — MAGNESIUM: Magnesium: 2.3 mg/dL (ref 1.5–2.5)

## 2017-08-25 MED ORDER — LORAZEPAM 0.5 MG PO TABS: ORAL_TABLET | ORAL | 0 refills | Status: DC

## 2017-08-25 NOTE — Progress Notes (Signed)
Erika Osborn is a 67 y.o. female is here for follow up.  History of Present Illness:   Erika Osborn CMA acting as scribe for Dr. [name].  HPI: Patient comes in today for ED follow up.   Left arm pain: Patient had a pneumonia shot last week and had some soreness in the arm from that. When she went to the ED she had cellulitis under the left arm from shaving her arms. ED placed patient on Keflex for the cellulitis.   Confused: Patient had an MRI at the ED from being confused. She was having some fatigue as well. She had started baclofen the previous day, so concerned that this could have caused the confusion. Will add this medication to allergy list. Dr. [name] went over the MRI with patient in the room. Patient does have a cyst that could be pressing on nerves. We will get a MRI with contrast ASAP.  We will put in referral to Neurology.   Hearing loss: Patient has noticed that she has had some hearing loss. She says that she has noticed in both ears. We will send to ENT/Audiology. Patient has wax build up in both ears. Will do an ear lavage today and check hearing.   Review of Systems  Constitutional: Negative for chills and fever.  HENT: Positive for hearing loss.   Eyes: Positive for blurred vision.  Respiratory: Negative for cough and shortness of breath.   Cardiovascular: Negative for chest pain and palpitations.  Gastrointestinal: Negative for nausea and vomiting.  Genitourinary: Negative for urgency.  Musculoskeletal: Negative for back pain and neck pain.  Neurological: Negative for dizziness and headaches.  Psychiatric/Behavioral: Negative for depression and suicidal ideas.    Health Maintenance Due  Topic Date Due  . COLONOSCOPY  12/15/1999   Depression screen [facility] 2/9 08/17/2017 08/08/2016 01/24/2016  Decreased Interest 3 0 0  Down, Depressed, Hopeless 1 0 0  PHQ - 2 Score 4 0 0  Altered sleeping 3 - -  Tired, decreased energy 3 - -  Change in appetite 2 - -  Feeling  bad or failure about yourself  0 - -  Trouble concentrating 0 - -  Moving slowly or fidgety/restless 0 - -  Suicidal thoughts 0 - -  PHQ-9 Score 12 - -   PMHx, SurgHx, SocialHx, FamHx, Medications, and Allergies were reviewed in the Visit Navigator and updated as appropriate.   Patient Active Problem List   Diagnosis Date Noted  . Elevated MCV 03/28/2017  . Elevated LFTs 03/28/2017  . Primary osteoarthritis of both hands 01/28/2017  . ANA positive 01/26/2017  . Positive anti-CCP test 11/30/2016  . Chronic bilateral low back pain with bilateral sciatica 11/16/2016  . Osteoarthritis of spine with radiculopathy, lumbar region 11/16/2016  . Polyarthralgia 11/16/2016  . Primary osteoarthritis of both knees 11/16/2016  . Onychomycosis 11/22/2015  . Overweight (BMI 25.0-29.9) 06/25/2015  . 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 Allergic conjunctivitis and rhinitis 01/17/2014  . 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 Fibromyalgia 07/19/2013  . HTN (hypertension) 07/19/2013  . HLD (hyperlipidemia) 07/19/2013  . Ulcerative colitis ([place]) 07/19/2013   Social History   Tobacco Use  . Smoking status: Never Smoker  . Smokeless tobacco: Never Used  Substance Use Topics  . Alcohol use: No  . Drug use: No   Current Medications and Allergies:   .  acetaminophen (TYLENOL) 650 MG CR tablet, Take 1,300 mg by mouth daily as needed for pain., Disp: , Rfl:  .  azaTHIOprine (IMURAN) 50 MG tablet, Take 125m by mouth daily, Disp: , Rfl: 5 .  Bioflavonoid Products (GRAPE SEED PO), Take 200 mg by mouth daily., Disp: , Rfl:  .  cephALEXin (KEFLEX) 500 MG capsule, Take 1 capsule (500 mg total) by mouth 4 (four) times daily., Disp: 40 capsule, Rfl: 0 .  cetirizine (ZYRTEC) 10 MG tablet, Take 10 mg by mouth daily., Disp: , Rfl:  .  Cholecalciferol (VITAMIN D3) 5000 units CAPS, Take 5,000 Units by mouth daily., Disp: , Rfl:  .  fluticasone (FLONASE) 50 MCG/ACT nasal spray, PLACE 2 SPRAYS INTO BOTH  NOSTRILS DAILY, Disp: 16 g, Rfl: 1 .  folic acid (FOLVITE) 4662MCG tablet, Take 400 mcg by mouth daily., Disp: , Rfl:  .  levothyroxine (SYNTHROID, LEVOTHROID) 100 MCG tablet, Take 1 tablet (100 mcg total) by mouth at bedtime., Disp: 90 tablet, Rfl: 1 .  mesalamine (LIALDA) 1.2 g EC tablet, Take 4.8 g by mouth daily with breakfast., Disp: , Rfl:  .  Misc Natural Products (TART CHERRY ADVANCED) CAPS, Take 804mby mouth once daily, Disp: , Rfl:  .  Multiple Vitamins-Minerals (MULTIVITAMIN & MINERAL PO), Take 1 tablet by mouth daily. , Disp: , Rfl:  .  potassium chloride SA (K-DUR,KLOR-CON) 20 MEQ tablet, Take 1 tablet (20 mEq total) by mouth 2 (two) times daily., Disp: 60 tablet, Rfl: 0 .  quinapril-hydrochlorothiazide (ACCURETIC) 20-12.5 MG tablet, Take 1 tablet by mouth daily., Disp: 90 tablet, Rfl: 0 .  ranitidine (ZANTAC) 150 MG capsule, Take 150 mg by mouth 2 (two) times daily. , Disp: , Rfl:  .  Red Yeast Rice Extract (RED YEAST RICE PO), Take by mouth daily., Disp: , Rfl:  .  Soft Lens Products (OPTI-FREE PUREMOIST REWETTING) SOLN, 1 drop by Does not apply route daily as needed (dry eyes)., Disp: , Rfl:  .  traMADol (ULTRAM) 50 MG tablet, 2 po TID, Disp: 240 tablet, Rfl: 2 .  TURMERIC PO, Take 2 capsules by mouth daily. With ginger, Disp: , Rfl:  .  azithromycin (ZITHROMAX) 250 MG tablet, 2 PO ON DAY ONE AND THEN ONE PO EACH DAY UNTIL GONE (Patient not taking: Reported on 08/25/2017), Disp: 6 tablet, Rfl: 0   Allergies  Allergen Reactions  . Humira [Adalimumab] Shortness Of Breath    Body feels like its on fire and turns red  . Remicade [Infliximab] Shortness Of Breath    Body feels like its on fire and turns red  . Penicillins Rash    Childhood allergy PATIENT HAS HAD A PCN REACTION WITH IMMEDIATE RASH, FACIAL/TONGUE/THROAT SWELLING, SOB, OR LIGHTHEADEDNESS WITH HYPOTENSION:  #  #  #  YES  #  #  #   Has patient had a PCN reaction causing severe rash involving mucus membranes or skin  necrosis: No Has patient had a PCN reaction that required hospitalization: No Has patient had a PCN reaction occurring within the last 10 years: No If all of the above answers are "NO", then may proceed with Cephalosporin use.   . Baclofen Other (See Comments)    confusion  . Erythromycin Rash  . Latex Rash    Skin redness    Review of Systems   Pertinent items are noted in the HPI. Otherwise, ROS is negative.  Vitals:   Vitals:   08/25/17 0901  BP: 140/78  Pulse: 63  Temp: 98.3 F (36.8 C)  Weight: 137 lb (62.1 kg)  Height: 5' (1.524 m)     Body mass  index is 26.76 kg/m.  Physical Exam:   Physical Exam   Results for orders placed or performed during the hospital encounter of 08/20/17  CBC  Result Value Ref Range   WBC 5.5 4.0 - 10.5 K/uL   RBC 3.52 (L) 3.87 - 5.11 MIL/uL   Hemoglobin 11.8 (L) 12.0 - 15.0 g/dL   HCT 35.2 (L) 36.0 - 46.0 %   MCV 100.0 78.0 - 100.0 fL   MCH 33.5 26.0 - 34.0 pg   MCHC 33.5 30.0 - 36.0 g/dL   RDW 15.9 (H) 11.5 - 15.5 %   Platelets 298 150 - 400 K/uL  Basic metabolic panel  Result Value Ref Range   Sodium 138 135 - 145 mmol/L   Potassium 2.9 (L) 3.5 - 5.1 mmol/L   Chloride 100 (L) 101 - 111 mmol/L   CO2 26 22 - 32 mmol/L   Glucose, Bld 154 (H) 65 - 99 mg/dL   BUN 11 6 - 20 mg/dL   Creatinine, Ser 0.61 0.44 - 1.00 mg/dL   Calcium 9.4 8.9 - 10.3 mg/dL   GFR calc non Af Amer >60 >60 mL/min   GFR calc Af Amer >60 >60 mL/min   Anion gap 12 5 - 15   Assessment and Plan:   Current Problem List updated with notes today.   Patient Active Problem List   Diagnosis Date Noted  . Elevated MCV 03/28/2017  . Elevated LFTs 03/28/2017  . Primary osteoarthritis of both hands 01/28/2017  . ANA positive 01/26/2017  . Positive anti-CCP test 11/30/2016  . Chronic bilateral low back pain with bilateral sciatica 11/16/2016  . Osteoarthritis of spine with radiculopathy, lumbar region 11/16/2016  . Polyarthralgia 11/16/2016  . Primary  osteoarthritis of both knees 11/16/2016  . Onychomycosis 11/22/2015  . Overweight (BMI 25.0-29.9) 06/25/2015  . 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 Allergic conjunctivitis and rhinitis 01/17/2014  . 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 Fibromyalgia 07/19/2013  . HTN (hypertension) 07/19/2013  . HLD (hyperlipidemia) 07/19/2013  . Ulcerative colitis ([place]) 07/19/2013   Erika Osborn was seen today for follow-up.  Diagnoses and all orders for this visit:  Mass of pineal region Comments: MRI from ER visit reviewed.  Current recommendation is for MRI with contrast.  This is already been ordered but is being held up by insurance. Orders: -     LORazepam (ATIVAN) 0.5 MG tablet; 1-2 po at time of MRI  Hypokalemia Comments: Recheck today.  If patient continues to have hypokalemia, will adjust pills as her current medication is too large to swallow. Orders: -     Comprehensive metabolic panel -     Magnesium  Anemia Comments: Recheck today. Orders: -     CBC with Differential/Platelet  Cellulitis of left axilla, RESOLVED Comments: Patient still on Keflex but improving quickly.  Fibromyalgia Comments: We will add baclofen to her allergy/intolerance list as this likely contributed to her fatigue and confusion.  Bilateral impacted cerumen Comments: Lavage today in usual manner.  PROCEDURE: CERUMEN DISIMPACTION    The patient had a large amount of cerumen in the external auditory canal(s): bilateral  Ear wax softener was used prior to the lavage.  Ear lavage was performed on bilateral ear(s) by Erika Osborn  There were no complications and following the disimpaction the tympanic membrane was visible.  Charges to be entered in Charge Capture section.  Orders Placed This Encounter  Procedures  . CBC with Differential/Platelet  . Comprehensive metabolic panel  . Magnesium   Meds  ordered this encounter  Medications  . LORazepam (ATIVAN) 0.5 MG tablet    Sig: 1-2  po at time of MRI    Dispense:  2 tablet    Refill:  0   . Reviewed expectations re: course of current medical issues. . Discussed self-management of symptoms. . Outlined signs and symptoms indicating need for more acute intervention. . Patient verbalized understanding and all questions were answered. Marland Kitchen Health Maintenance issues including appropriate healthy diet, exercise, and smoking avoidance were discussed with patient. . See orders for this visit as documented in the electronic medical record. . Patient received an After Visit Summary.  CMA served as Education administrator during this visit. History, Physical, and Plan performed by medical provider. The above documentation has been reviewed and is accurate and complete. Briscoe Deutscher, D.O.  Briscoe Deutscher, DO [place] 08/25/2017

## 2017-08-25 NOTE — Addendum Note (Signed)
Addended by: Frutoso Chase A on: 08/25/2017 09:31 AM   Modules accepted: Orders

## 2017-08-26 ENCOUNTER — Telehealth: Payer: Self-pay | Admitting: Family Medicine

## 2017-08-26 NOTE — Telephone Encounter (Signed)
Copied from [place]. Topic: Quick Communication - See Telephone Encounter >> Aug 26, 2017 10:06 AM Robina Ade, Helene Kelp D wrote: CRM for notification. See Telephone encounter for: 08/26/17. Medication: Potassium Patient called and said that Dr. [name] was suppose to send in a smaller potassium pill for her to take, if this can be send to her pharmacy [facility] [place], [place] - [address] AT [address].

## 2017-08-26 NOTE — Telephone Encounter (Signed)
See note

## 2017-08-27 ENCOUNTER — Telehealth: Payer: Self-pay | Admitting: Family Medicine

## 2017-08-27 NOTE — Telephone Encounter (Signed)
Copied from [place] [phone]. Topic: Quick Communication - See Telephone Encounter >> Aug 27, 2017  2:16 PM Boyd Kerbs wrote: CRM for notification. See Telephone encounter for:   Erika Osborn got a letter from [employer] needing information why a 2nd MRI w/contrast if necessary,  Insurance letter has to be answered with 45 days of the 5th of march.   She wanted to be sure office received the letter too 08/27/17.

## 2017-08-27 NOTE — Telephone Encounter (Signed)
Please advise 

## 2017-08-29 DIAGNOSIS — D649 Anemia, unspecified: Secondary | ICD-10-CM | POA: Insufficient documentation

## 2017-08-29 DIAGNOSIS — G9389 Other specified disorders of brain: Secondary | ICD-10-CM | POA: Insufficient documentation

## 2017-08-29 MED ORDER — POTASSIUM CHLORIDE ER 10 MEQ PO TBCR: 10.0000 meq | EXTENDED_RELEASE_TABLET | Freq: Every day | ORAL | 2 refills | Status: DC

## 2017-08-29 NOTE — Addendum Note (Signed)
Addended by: Briscoe Deutscher R on: 08/29/2017 07:04 PM   Modules accepted: Orders

## 2017-08-30 NOTE — Telephone Encounter (Unsigned)
Copied from [place]. Topic: Quick Communication - See Telephone Encounter >> Aug 30, 2017 10:37 AM Neva Seat wrote: [place] [phone]  Needs: Clarification on order place in Epic regarding the MRI Brain with and without And the authorization attained

## 2017-08-30 NOTE — Telephone Encounter (Signed)
See Note.

## 2017-08-30 NOTE — Telephone Encounter (Signed)
Is this the right order

## 2017-09-01 NOTE — Telephone Encounter (Signed)
Needs with contrast. Was the potassium questions resolved?

## 2017-09-02 NOTE — Telephone Encounter (Signed)
Erika Osborn was the MRI that [facility] imaging have with contrast? I know you was working on this yesterday.

## 2017-09-03 ENCOUNTER — Encounter (INDEPENDENT_AMBULATORY_CARE_PROVIDER_SITE_OTHER): Payer: Self-pay | Admitting: Physical Medicine and Rehabilitation

## 2017-09-03 ENCOUNTER — Ambulatory Visit (INDEPENDENT_AMBULATORY_CARE_PROVIDER_SITE_OTHER): Payer: Managed Care, Other (non HMO) | Admitting: Physical Medicine and Rehabilitation

## 2017-09-03 VITALS — BP 149/86 | HR 75 | Temp 98.0°F

## 2017-09-03 DIAGNOSIS — Q762 Congenital spondylolisthesis: Secondary | ICD-10-CM

## 2017-09-03 DIAGNOSIS — M961 Postlaminectomy syndrome, not elsewhere classified: Secondary | ICD-10-CM

## 2017-09-03 DIAGNOSIS — M5416 Radiculopathy, lumbar region: Secondary | ICD-10-CM

## 2017-09-03 NOTE — Progress Notes (Signed)
.  Numeric Pain Rating Scale and Functional Assessment Average Pain 8 Pain Right Now 8 My pain is constant, stabbing and aching Pain is worse with: walking and bending Pain improves with: medication   In the last MONTH (on 0-10 scale) has pain interfered with the following?  1. General activity like being  able to carry out your everyday physical activities such as walking, climbing stairs, carrying groceries, or moving a chair?  Rating(6)  2. Relation with others like being able to carry out your usual social activities and roles such as  activities at home, at work and in your community. Rating(5)  3. Enjoyment of life such that you have  been bothered by emotional problems such as feeling anxious, depressed or irritable?  Rating(4)

## 2017-09-05 ENCOUNTER — Ambulatory Visit
Admission: RE | Admit: 2017-09-05 | Discharge: 2017-09-05 | Disposition: A | Discharge status: Home / Self Care | Payer: Managed Care, Other (non HMO) | Source: Ambulatory Visit | Attending: Family Medicine | Admitting: Family Medicine

## 2017-09-05 DIAGNOSIS — E348 Other specified endocrine disorders: Secondary | ICD-10-CM

## 2017-09-06 ENCOUNTER — Ambulatory Visit: Payer: Managed Care, Other (non HMO) | Admitting: Sports Medicine

## 2017-09-06 ENCOUNTER — Encounter: Payer: Self-pay | Admitting: Sports Medicine

## 2017-09-06 ENCOUNTER — Telehealth: Payer: Self-pay | Admitting: Family Medicine

## 2017-09-06 VITALS — BP 142/92 | HR 74 | Ht 60.0 in | Wt 139.8 lb

## 2017-09-06 DIAGNOSIS — K5180 Other ulcerative colitis without complications: Secondary | ICD-10-CM | POA: Diagnosis not present

## 2017-09-06 DIAGNOSIS — G8929 Other chronic pain: Secondary | ICD-10-CM | POA: Diagnosis not present

## 2017-09-06 DIAGNOSIS — M25561 Pain in right knee: Secondary | ICD-10-CM

## 2017-09-06 DIAGNOSIS — M5442 Lumbago with sciatica, left side: Secondary | ICD-10-CM | POA: Diagnosis not present

## 2017-09-06 DIAGNOSIS — M5441 Lumbago with sciatica, right side: Secondary | ICD-10-CM | POA: Diagnosis not present

## 2017-09-06 DIAGNOSIS — M2550 Pain in unspecified joint: Secondary | ICD-10-CM | POA: Diagnosis not present

## 2017-09-06 DIAGNOSIS — M797 Fibromyalgia: Secondary | ICD-10-CM | POA: Diagnosis not present

## 2017-09-06 DIAGNOSIS — M25562 Pain in left knee: Secondary | ICD-10-CM

## 2017-09-06 DIAGNOSIS — M170 Bilateral primary osteoarthritis of knee: Secondary | ICD-10-CM | POA: Diagnosis not present

## 2017-09-06 MED ORDER — CLINDAMYCIN HCL 300 MG PO CAPS: 300.0000 mg | ORAL_CAPSULE | Freq: Three times a day (TID) | ORAL | 0 refills | Status: DC

## 2017-09-06 MED ORDER — DIAZEPAM 5 MG PO TABS
ORAL_TABLET | ORAL | 0 refills | Status: DC
Start: 2017-09-06 — End: 2017-11-30

## 2017-09-06 NOTE — Progress Notes (Signed)
  Erika Osborn - 67 y.o. female MRN [id number]  Date of birth: 08-12-1950  Scribe for today's visit: Josepha Pigg, CMA     SUBJECTIVE:  Erika Osborn is here for Follow-up (LBP and R hip pain)  Compared to the last office visit, her previously described symptoms are worsening, pain is constant and more intense. She is having more difficulty moving around.  Current symptoms are moderate & are radiating to lateral aspect of thigh and posterior lower leg.  She has been going to PT at [facility]. She reports that was going well but she had a setback in Feb d/t infection in her L arm. 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 She reports anxiety during MRI despite taking 2 Ativan.  She saw Dr. Ernestina Patches 09/03/17 and discussed possible injection vs implant vs another medication.  She has been taking Tylenol and Tramadol with some relief. She has been trying to take it easy and avoid strenuous activity.   ROS Reports night time disturbances. Denies fevers, chills, or night sweats. Denies unexplained weight loss. Denies personal history of cancer. Reports changes in bowel or bladder habits d/t abx use. Denies recent unreported falls. Reports new or worsening dyspnea during MRI yesterday. Reports headaches.  Reports numbness, tingling or weakness  In the extremities.  Denies dizziness or presyncopal episodes Denies lower extremity edema      Please see additional documentation for Objective, Assessment and Plan sections. Pertinent additional documentation may be included in corresponding procedure notes, imaging studies, problem based documentation and patient instructions. Please see these sections of the encounter for additional information regarding this visit.  CMA/ATC served as Education administrator during this visit. History, Physical, and Plan performed by medical provider. Documentation and orders reviewed and  attested to.      [name], [facility] Physician

## 2017-09-06 NOTE — Telephone Encounter (Signed)
Left message for patient to return call to get more information.

## 2017-09-06 NOTE — Telephone Encounter (Signed)
Please call her and get more info. I can send in Valium and antibiotic if needed.

## 2017-09-06 NOTE — Progress Notes (Deleted)
   Erika Osborn. [place], [place] at [facility] [phone]  Taneasha Fuqua - 67 y.o. female MRN [id number]  Date of birth: 1950-08-24  Visit Date: 09/06/2017  PCP: Briscoe Deutscher, DO   Referred by: Briscoe Deutscher, DO  Please see additional documentation for HPI, review of systems.  HISTORY & PERTINENT PRIOR DATA:  Prior History reviewed and updated per electronic medical record.  Significant history, findings, studies and interim changes include:  reports that  has never smoked. she has never used smokeless tobacco. Recent Labs    10/21/16 1051 02/08/17 0956  HGBA1C  --  5.7  LABURIC 4.5  --    The 10-year ASCVD risk score Mikey Bussing DC Jr., et al., 2013) is: 12.6%   Values used to calculate the score:     Age: 14 years     Sex: Female     Is Non-Hispanic African American: No     Diabetic: No     Tobacco smoker: No     Systolic Blood Pressure: 409 mmHg     Is BP treated: Yes     HDL Cholesterol: 52.6 mg/dL     Total Cholesterol: 271 mg/dL No problems updated.  OBJECTIVE:  VS:  HT:    WT:   BMI:     BP:   HR: bpm  TEMP: ( )  RESP:    PHYSICAL EXAM: {Constitutional:19197::"WDWN, Non-toxic appearing.":1} {Psychiatric:19196:s:"Alert & appropriately interactive. ","Not depressed or anxious appearing.":1} {Respiratory:19196:s:"No increased work of breathing. ","Trachea Midline":1} {Eyes:19196:s:"Pupils are equal. ","EOM intact without nystagmus. ","No scleral icterus":1}  NEUROVASCULAR exam: {Blank multiple:19196:p:"No clubbing or cyanosis appreciated","No significant venous stasis changes"} {Capillary Refill:19197::"normal, less than 2 seconds":1}  {EXTRIMITY EXAM:22582:p}  {details:5942}  {Right/Left/Bilateral:210160114} {Joints:15814}  {Alignment & Contours::"normal":1} {Skin:10274::"No overlying erythema/ecchymosis":1} {Effusion:317890::"none":1}   {Generalized Synovitis:14795::"none":1} {Tenderness:140010::"No focal  bony TTP":1}   RANGE OF MOTION & STRENGTH  {Normal/Abnormal:304960160}   LIGAMENTOUS TESTING  {Normal/Abnormal:304960160}   SPECIALITY TESTING:  ***    ASSESSMENT & PLAN:  No diagnosis found. Orders & Meds: No orders of the defined types were placed in this encounter.  No orders of the defined types were placed in this encounter.   PLAN:  ***   ***   No problem-specific Assessment & Plan notes found for this encounter.   Follow-up: No Follow-up on file.

## 2017-09-06 NOTE — Telephone Encounter (Signed)
Spoke with patient and she stated that when doing the MRI her sinus was bothering her so much she could not finis. She also had a panic attack. She took the ativan, but this did not help. Please send in medication.

## 2017-09-06 NOTE — Progress Notes (Signed)
  Erika Osborn - 67 y.o. female MRN [id number]  Date of birth: 10-29-1950  Visit Date:   PCP: Briscoe Deutscher, DO   Referred by: Briscoe Deutscher, DO  Please see additional documentation for HPI, review of systems.  HISTORY & PERTINENT PRIOR DATA:  Prior History reviewed and updated per electronic medical record.  Significant/pertinent history, findings, studies include:  reports that she has never smoked. She has never used smokeless tobacco. Recent Labs    10/21/16 1051 02/08/17 0956  HGBA1C  --  5.7  LABURIC 4.5  --    The 10-year ASCVD risk score Mikey Bussing DC Jr., et al., 2013) is: 12.6%   Values used to calculate the score:     Age: 40 years     Sex: Female     Is Non-Hispanic African American: No     Diabetic: No     Tobacco smoker: No     Systolic Blood Pressure: 741 mmHg     Is BP treated: Yes     HDL Cholesterol: 52.6 mg/dL     Total Cholesterol: 271 mg/dL No problems updated.  OBJECTIVE:  VS:  HT:5' (152.4 cm)   WT:139 lb 12.8 oz (63.4 kg)  BMI:27.3    BP: (Abnormal) 142/92   HR:74bpm  TEMP: ( )  RESP:98 %   PHYSICAL EXAM: WDWN, Non-toxic appearing. Psychiatric: Alert & appropriately interactive.  Not depressed or anxious appearing. Respiratory: No increased work of breathing.  Trachea Midline Eyes: Pupils are equal.  EOM intact without nystagmus.  No scleral icterus  VASCULAR: no edema upper and lower  extremity neuro exam: unremarkable normal strength normal sensation    ASSESSMENT & PLAN:  1. Fibromyalgia   2. Chronic bilateral low back pain with bilateral sciatica   3. Chronic pain of both knees   4. Polyarthralgia   5. Other ulcerative colitis without complication ([place])   6. Primary osteoarthritis of both knees     PLAN:   Eduard Clos has an upcoming appointment for an MRI.  This point we will plan to have her continue with her current medication regimen.  We will have her continue working on therapeutic exercises previously prescribed and at this  time defer further injections but discussed that this can be done at any time if she would like.   >50% of this 25 minute visit spent in direct patient counseling and/or coordination of care.  Discussion was focused on education regarding the in discussing the pathoetiology and anticipated clinical course of the above condition.    Follow-up: Return if symptoms worsen or fail to improve.

## 2017-09-06 NOTE — Telephone Encounter (Signed)
The patient came in today for an appt with Dr Paulla Fore but she had a concern and wanted to give a message to Dr [name]. The patient  Was unable to get her MRI done and needs another Rx for her Sinus. She had trouble laying down in the MRI machine because she was unable to breathe,

## 2017-09-06 NOTE — Addendum Note (Signed)
Addended by: Briscoe Deutscher R on: 09/06/2017 03:41 PM   Modules accepted: Orders

## 2017-09-08 ENCOUNTER — Telehealth: Payer: Self-pay | Admitting: Family Medicine

## 2017-09-08 ENCOUNTER — Telehealth (INDEPENDENT_AMBULATORY_CARE_PROVIDER_SITE_OTHER): Payer: Self-pay | Admitting: *Deleted

## 2017-09-08 NOTE — Telephone Encounter (Signed)
Called patient let her know she will call if any problems. Wanted to let you know MRI is scheduled for 3/30

## 2017-09-08 NOTE — Telephone Encounter (Signed)
See note

## 2017-09-08 NOTE — Telephone Encounter (Signed)
Yes

## 2017-09-08 NOTE — Telephone Encounter (Signed)
Copied from [place]. Topic: Quick Communication - See Telephone Encounter >> Sep 08, 2017  9:22 AM Synthia Innocent wrote: CRM for notification. See Telephone encounter for: patient was prescribed clindamycin (CLEOCIN) 300 MG capsule, she wants to know if that will treat a sinus infection. Please advise 09/08/17.

## 2017-09-08 NOTE — Telephone Encounter (Signed)
Please advise 

## 2017-09-18 ENCOUNTER — Ambulatory Visit
Admission: RE | Admit: 2017-09-18 | Discharge: 2017-09-18 | Disposition: A | Discharge status: Home / Self Care | Payer: Managed Care, Other (non HMO) | Source: Ambulatory Visit | Attending: Family Medicine | Admitting: Family Medicine

## 2017-09-18 MED ORDER — GADOBENATE DIMEGLUMINE 529 MG/ML IV SOLN
13.0000 mL | Freq: Once | INTRAVENOUS | Status: AC | PRN
  Administered 2017-09-18: 13 mL via INTRAVENOUS

## 2017-09-20 ENCOUNTER — Other Ambulatory Visit: Payer: Self-pay

## 2017-09-20 DIAGNOSIS — G9389 Other specified disorders of brain: Secondary | ICD-10-CM

## 2017-09-22 ENCOUNTER — Encounter (INDEPENDENT_AMBULATORY_CARE_PROVIDER_SITE_OTHER): Payer: Self-pay | Admitting: Physical Medicine and Rehabilitation

## 2017-09-22 NOTE — Progress Notes (Signed)
Erika Osborn - 67 y.o. female MRN [id number]  Date of birth: 1950-12-24  Office Visit Note: Visit Date: 09/03/2017 PCP: Briscoe Deutscher, DO Referred by: Briscoe Deutscher, DO  Subjective: Chief Complaint  Patient presents with  . Lower Back - Pain  . Right Leg - Pain   HPI: Erika Osborn is a 67 year old female who is kindly referred today for evaluation and possible management of her low back and hip and leg pain particularly on the right.  She comes in today at the request of her primary care physician Dr. Briscoe Deutscher.  She also sees Dr. Teresa Coombs in that practice for orthopedic tight care.  She reports today that she has had chronic many year history of back and hip pain.  She reports over the last 2 years it is progressively worsened without specific injury or inciting incident.  She reports that she has a history of lumbar disc surgery many years ago.  She reports low back pain really across the low back worse with standing and ambulating.  It will radiate down into the right leg.  The right hip and leg is really been the worst problem.  She reports that tramadol will take the edge off of her pain.  She characterizes this pain as a constant stabbing and aching pain.  She has had physical therapy in the past with some relief at times.  Overall she has been fairly miserable.From a functional standpoint it does really limit a lot of things she would like to do.  She denies any left-sided leg complaints.  She really does not endorse much in the way of tingling or numbness.  She has had no focal weakness.  She has had no red flag symptoms of bowel or bladder issues that are not what she normally has had.  She has had no fevers or chills or night sweats.  She carries a diagnosis of fibromyalgia which can be an underlying problem given the fact that she does have significant degenerative changes of the lumbar spine.  Fairly recent MRI from last year of the lumbar spine is reviewed below.  Her case is also  complicated by recent discovery of a pineal gland mass and this is undergoing further workup.   Review of Systems  Constitutional: Positive for malaise/fatigue. Negative for chills, fever and weight loss.  HENT: Negative for hearing loss and sinus pain.   Eyes: Negative for blurred vision, double vision and photophobia.  Respiratory: Negative for cough and shortness of breath.   Cardiovascular: Negative for chest pain, palpitations and leg swelling.  Gastrointestinal: Negative for abdominal pain, nausea and vomiting.  Genitourinary: Negative for flank pain.  Musculoskeletal: Positive for back pain and joint pain. Negative for myalgias.  Skin: Negative for itching and rash.  Neurological: Negative for tremors, focal weakness and weakness.  Endo/Heme/Allergies: Negative.   Psychiatric/Behavioral: Negative for depression.  All other systems reviewed and are negative.  Otherwise per HPI.  Assessment & Plan: Visit Diagnoses:  1. Lumbar radiculopathy   2. Post laminectomy syndrome   3. Congenital spondylolysis     Plan: Findings:  Chronic history of back and hip and leg pain particularly on the right.  The right hip and leg pain really is worse than her back pain at this point.  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  She has other levels of spondylosis as well but without central canal stenosis.  I think the L5 foraminal  stenosis from the pars defect is really the culprit at this point.  I would recommend a right L5 transforaminal epidural steroid injection.  This would be diagnostic and therapeutic.  We discussed her spine at length as well as activity modification and medications etc.  We discussed the issues of surgical corrections.  We talked about core strengthening.  We talked a lot about fibromyalgia as well.  She is undergoing workup for this pineal gland mass and this seems to be on her mind at this point as well.  She is  going to think about having the injection done and she will get back to [place].  Current medications are probably adequate as the tramadol seems to help to a degree.  I do think a mild bit of tramadol for her given the other issues is probably not necessarily a big problem from a pain medicine standpoint.    Meds & Orders: No orders of the defined types were placed in this encounter.  No orders of the defined types were placed in this encounter.   Follow-up: Return if symptoms worsen or fail to improve.   Procedures: No procedures performed  No notes on file   Clinical History: MRI LUMBAR SPINE WITHOUT CONTRAST  TECHNIQUE: Multiplanar, multisequence MR imaging of the lumbar spine was performed. No intravenous contrast was administered.  COMPARISON:  Two-view lumbar spine radiographs 10/21/2016  FINDINGS: Segmentation: 5 non rib-bearing lumbar type vertebral bodies are present.  Alignment: Grade 1 anterolisthesis at L5-S1 measures 8 mm. AP alignment is otherwise anatomic. Rightward curvature is centered at L2-3.  Vertebrae:  Marrow signal and vertebral body heights are normal.  Conus medullaris: Extends to the L1 level and appears normal.  Paraspinal and other soft tissues: Bilateral subcentimeter renal cysts are present. No other solid organ lesion is present. There is no significant adenopathy. Paraspinous musculature is within normal limits.  Disc levels:  T12-L1: Facet hypertrophy is worse on the left. There is no significant stenosis.  L1-2: Asymmetric left-sided facet hypertrophy and and disc bulging is present. Mild left subarticular narrowing results. Mild left foraminal narrowing is present.  L2-3: A leftward disc protrusion is present. Asymmetric left-sided facet hypertrophy is noted. Mild left subarticular and foraminal narrowing is present.  L3-4: A leftward disc protrusion is present. Asymmetric facet hypertrophy is present. Mild left  subarticular and moderate left foraminal stenosis is present. Facet hypertrophy contributes to mild right foraminal narrowing.  L4-5: The broad-based disc protrusion is asymmetric to the right. Facet hypertrophy is worse on the right. Mild right subarticular and moderate right foraminal stenosis is present. Mild left foraminal narrowing is noted.  L5-S1: Bilateral pars defects are present. There is uncovering of a broad-based disc protrusion. The central canal is patent. Moderate to severe foraminal stenosis is present bilaterally.  IMPRESSION: 1. Multilevel spondylosis of the cervical spine with moderate dextroconvex scoliosis. 2. Mild left subarticular and foraminal narrowing at L1-2 and L2-3. 3. Mild left subarticular moderate left foraminal stenosis at L3-4. 4. Mild right subarticular and moderate right foraminal stenosis at L4-5. 5. 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 6.   Electronically Signed   By: San Morelle M.D.   On: 03/30/2017 10:10   She reports that she has never smoked. She has never used smokeless tobacco.  Recent Labs    10/21/16 1051 02/08/17 0956  HGBA1C  --  5.7  LABURIC 4.5  --  Objective:  VS:  HT:    WT:   BMI:     BP:(!) 149/86  HR:75bpm  TEMP:98 F (36.7 C)(Oral)  RESP:100 % Physical Exam  Constitutional: She is oriented to person, place, and time. She appears well-developed and well-nourished. No distress.  HENT:  Head: Normocephalic and atraumatic.  Nose: Nose normal.  Mouth/Throat: Oropharynx is clear and moist.  Eyes: Pupils are equal, round, and reactive to light. Conjunctivae are normal.  Neck: Normal range of motion. Neck supple.  Cardiovascular: Regular rhythm and intact distal pulses.  Pulmonary/Chest: Effort normal. No respiratory distress.  Abdominal: She exhibits no distension. There is no guarding.    Musculoskeletal:  The patient is slow to rise from a seated position.  She does have significant concordant back pain with extension rotation of the lumbar spine.  She appears to have somewhat of an increased lordosis.  She has no appreciable scoliosis.  She does have tender points across the lower back and PSIS and greater trochanters.  She has no focal trigger points.  She does have one taut band in the quadratus lumborum area on the  right.  She has no pain with hip rotation.  She has good distal strength.  Neurological: She is alert and oriented to person, place, and time. She exhibits normal muscle tone. Coordination normal.  Skin: Skin is warm. No rash noted. No erythema.  Psychiatric: She has a normal mood and affect. Her behavior is normal.  Nursing note and vitals reviewed.   Ortho Exam Imaging: No results found.  Past Medical/Family/Surgical/Social History: Medications & Allergies reviewed per EMR, new medications updated. Patient Active Problem List   Diagnosis Date Noted  . 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 Anemia 08/29/2017  . Elevated MCV 03/28/2017  . Elevated LFTs 03/28/2017  . Primary osteoarthritis of both hands 01/28/2017  . ANA positive 01/26/2017  . Positive anti-CCP test 11/30/2016  . Chronic bilateral low back pain with bilateral sciatica 11/16/2016  . Osteoarthritis of spine with radiculopathy, lumbar region 11/16/2016  . Polyarthralgia 11/16/2016  . Primary osteoarthritis of both knees 11/16/2016  . Onychomycosis 11/22/2015  . Overweight (BMI 25.0-29.9) 06/25/2015  . 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 Allergic conjunctivitis and rhinitis 01/17/2014  . 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 Fibromyalgia 07/19/2013  . HTN (hypertension) 07/19/2013  . HLD (hyperlipidemia) 07/19/2013  . Ulcerative colitis ([place]) 07/19/2013   Past Medical History:  Diagnosis Date  . Arthritis   . Chronic back pain   . Complication of anesthesia    increase  blood pressure for not taking meds  . Depression   . Fibromyalgia   . GERD (gastroesophageal reflux disease)   . Headache   . HLD (hyperlipidemia)   . HTN (hypertension)   . Hypothyroidism following radioiodine therapy   . Pneumonia    h/o pneumonia yrs. ago  . Ulcerative colitis ([place]) 1976   Family History  Problem Relation Age of Onset  . Cancer Father   . Heart failure Mother   . Hyperlipidemia Mother   . Osteoporosis Mother   . Hyperlipidemia Brother   . Hyperlipidemia Sister    Past Surgical History:  Procedure Laterality Date  . APPENDECTOMY    . BREAST REDUCTION SURGERY    . BREAST REDUCTION SURGERY Bilateral 06/22/2005  . COLONOSCOPY W/ BIOPSIES AND POLYPECTOMY    . LUMBAR DISC SURGERY    . RADIOLOGY WITH ANESTHESIA N/A 03/30/2017   Procedure: RADIOLOGY WITH ANESTHESIA-LUMBAR SPINE WITHOUT CONTRAST;  Surgeon: Radiologist, Medication, MD;  Location: [facility];  Service: Radiology;  Laterality: N/A;  . REDUCTION MAMMAPLASTY    . TONSILLECTOMY    . UTERINE SUSPENSION    . WISDOM TOOTH EXTRACTION     Social History   Occupational History  . Occupation: Unemployed  Tobacco Use  . Smoking status: Never Smoker  . Smokeless tobacco: Never Used  Substance and Sexual Activity  . Alcohol use: No  . Drug use: No  . Sexual activity: Yes    Partners: Male    Comment: Married.

## 2017-09-22 NOTE — Telephone Encounter (Signed)
Did this get approved, Right L5 TF esi

## 2017-10-07 ENCOUNTER — Ambulatory Visit (INDEPENDENT_AMBULATORY_CARE_PROVIDER_SITE_OTHER): Payer: Managed Care, Other (non HMO)

## 2017-10-07 ENCOUNTER — Ambulatory Visit (INDEPENDENT_AMBULATORY_CARE_PROVIDER_SITE_OTHER): Payer: Managed Care, Other (non HMO) | Admitting: Physical Medicine and Rehabilitation

## 2017-10-07 ENCOUNTER — Encounter (INDEPENDENT_AMBULATORY_CARE_PROVIDER_SITE_OTHER): Payer: Self-pay | Admitting: Physical Medicine and Rehabilitation

## 2017-10-07 VITALS — BP 144/77 | HR 86 | Temp 98.1°F

## 2017-10-07 DIAGNOSIS — M5416 Radiculopathy, lumbar region: Secondary | ICD-10-CM | POA: Diagnosis not present

## 2017-10-07 MED ORDER — BETAMETHASONE SOD PHOS & ACET 6 (3-3) MG/ML IJ SUSP
12.0000 mg | Freq: Once | INTRAMUSCULAR | Status: AC
  Administered 2017-10-07: 12 mg

## 2017-10-07 NOTE — Patient Instructions (Signed)

## 2017-10-07 NOTE — Progress Notes (Signed)
.  Numeric Pain Rating Scale and Functional Assessment Average Pain 6   In the last MONTH (on 0-10 scale) has pain interfered with the following?  1. General activity like being  able to carry out your everyday physical activities such as walking, climbing stairs, carrying groceries, or moving a chair?  Rating(5)   +Driver, -BT, -Dye Allergies.  

## 2017-10-08 ENCOUNTER — Other Ambulatory Visit: Payer: Self-pay | Admitting: Family Medicine

## 2017-10-18 NOTE — Progress Notes (Signed)
Erika Osborn - 67 y.o. female MRN [id number]  Date of birth: [birth year]  Office Visit Note: Visit Date: 10/07/2017 PCP: Briscoe Deutscher, DO Referred by: Briscoe Deutscher, DO  Subjective: Chief Complaint  Patient presents with  . Middle Back - Pain  . Lower Back - Pain  . Neck - Pain  . Right Leg - Pain  . Left Leg - Pain   HPI: Erika Osborn is a 67 year old female with severe worsening right much more than the left hip and leg pain and back pain.  Her course is complicated by osteoarthritis as well as multiple medical conditions and fibromyalgia.  She returns today for planned right L5 transforaminal epidural steroid injection.  The injection  will be diagnostic and hopefully therapeutic. The patient has failed conservative care including time, medications and activity modification.   ROS Otherwise per HPI.  Assessment & Plan: Visit Diagnoses:  1. Lumbar radiculopathy     Plan: No additional findings.   Meds & Orders:  Meds ordered this encounter  Medications  . betamethasone acetate-betamethasone sodium phosphate (CELESTONE) injection 12 mg    Orders Placed This Encounter  Procedures  . XR C-ARM NO REPORT  . Epidural Steroid injection    Follow-up: Return in about 2 weeks (around 10/21/2017), or if symptoms worsen or fail to improve.   Procedures: No procedures performed  Lumbosacral Transforaminal Epidural Steroid Injection - Sub-Pedicular Approach with Fluoroscopic Guidance  Patient: Erika Osborn      Date of Birth: 09-Jul-1950 MRN: [id number] PCP: Briscoe Deutscher, DO      Visit Date: 10/07/2017   Universal Protocol:    Date/Time: 10/07/2017  Consent Given By: the patient  Position: PRONE  Additional Comments: Vital signs were monitored before and after the procedure. Patient was prepped and draped in the usual sterile fashion. The correct patient, procedure, and site was verified.   Injection Procedure Details:  Procedure Site One Meds Administered:  Meds  ordered this encounter  Medications  . betamethasone acetate-betamethasone sodium phosphate (CELESTONE) injection 12 mg    Laterality: Right  Location/Site:  L5-S1  Needle size: 22 G  Needle type: Spinal  Needle Placement: Transforaminal  Findings:    -Comments: Excellent flow of contrast along the nerve and into the epidural space.  Procedure Details: After squaring off the end-plates to get a true AP view, the C-arm was positioned so that an oblique view of the foramen as noted above was visualized. The target area is just inferior to the "nose of the scotty dog" or sub pedicular. The soft tissues overlying this structure were infiltrated with 2-3 ml. of 1% Lidocaine without Epinephrine.  The spinal needle was inserted toward the target using a "trajectory" view along the fluoroscope beam.  Under AP and lateral visualization, the needle was advanced so it did not puncture dura and was located close the 6 O'Clock position of the pedical in AP tracterory. Biplanar projections were used to confirm position. Aspiration was confirmed to be negative for CSF and/or blood. A 1-2 ml. volume of Isovue-250 was injected and flow of contrast was noted at each level. Radiographs were obtained for documentation purposes.   After attaining the desired flow of contrast documented above, a 0.5 to 1.0 ml test dose of 0.25% Marcaine was injected into each respective transforaminal space.  The patient was observed for 90 seconds post injection.  After no sensory deficits were reported, and normal lower extremity motor function was noted,   the above injectate was administered  so that equal amounts of the injectate were placed at each foramen (level) into the transforaminal epidural space.   Additional Comments:  The patient tolerated the procedure well Dressing: Band-Aid    Post-procedure details: Patient was observed during the procedure. Post-procedure instructions were reviewed.  Patient left  the clinic in stable condition.    Clinical History: MRI LUMBAR SPINE WITHOUT CONTRAST  TECHNIQUE: Multiplanar, multisequence MR imaging of the lumbar spine was performed. No intravenous contrast was administered.  COMPARISON:  Two-view lumbar spine radiographs 10/21/2016  FINDINGS: Segmentation: 5 non rib-bearing lumbar type vertebral bodies are present.  Alignment: Grade 1 anterolisthesis at L5-S1 measures 8 mm. AP alignment is otherwise anatomic. Rightward curvature is centered at L2-3.  Vertebrae:  Marrow signal and vertebral body heights are normal.  Conus medullaris: Extends to the L1 level and appears normal.  Paraspinal and other soft tissues: Bilateral subcentimeter renal cysts are present. No other solid organ lesion is present. There is no significant adenopathy. Paraspinous musculature is within normal limits.  Disc levels:  T12-L1: Facet hypertrophy is worse on the left. There is no significant stenosis.  L1-2: Asymmetric left-sided facet hypertrophy and and disc bulging is present. Mild left subarticular narrowing results. Mild left foraminal narrowing is present.  L2-3: A leftward disc protrusion is present. Asymmetric left-sided facet hypertrophy is noted. Mild left subarticular and foraminal narrowing is present.  L3-4: A leftward disc protrusion is present. Asymmetric facet hypertrophy is present. Mild left subarticular and moderate left foraminal stenosis is present. Facet hypertrophy contributes to mild right foraminal narrowing.  L4-5: The broad-based disc protrusion is asymmetric to the right. Facet hypertrophy is worse on the right. Mild right subarticular and moderate right foraminal stenosis is present. Mild left foraminal narrowing is noted.  L5-S1: Bilateral pars defects are present. There is uncovering of a broad-based disc protrusion. The central canal is patent. Moderate to severe foraminal stenosis is present  bilaterally.  IMPRESSION: 1. Multilevel spondylosis of the cervical spine with moderate dextroconvex scoliosis. 2. Mild left subarticular and foraminal narrowing at L1-2 and L2-3. 3. Mild left subarticular moderate left foraminal stenosis at L3-4. 4. Mild right subarticular and moderate right foraminal stenosis at L4-5. 5. 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 6.   Electronically Signed   By: San Morelle M.D.   On: 03/30/2017 10:10   She reports that she has never smoked. She has never used smokeless tobacco.  Recent Labs    10/21/16 1051 02/08/17 0956  HGBA1C  --  5.7  LABURIC 4.5  --     Objective:  VS:  HT:    WT:   BMI:     BP:(!) 144/77  HR:86bpm  TEMP:98.1 F (36.7 C)(Oral)  RESP:99 % Physical Exam  Ortho Exam Imaging: No results found.  Past Medical/Family/Surgical/Social History: Medications & Allergies reviewed per EMR, new medications updated. Patient Active Problem List   Diagnosis Date Noted  . 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 Anemia 08/29/2017  . Elevated MCV 03/28/2017  . Elevated LFTs 03/28/2017  . Primary osteoarthritis of both hands 01/28/2017  . ANA positive 01/26/2017  . Positive anti-CCP test 11/30/2016  . Chronic bilateral low back pain with bilateral sciatica 11/16/2016  . Osteoarthritis of spine with radiculopathy, lumbar region 11/16/2016  . Polyarthralgia 11/16/2016  . Primary osteoarthritis of both knees 11/16/2016  . Onychomycosis 11/22/2015  . Overweight (BMI 25.0-29.9) 06/25/2015  . Frozen  shoulder 02/13/2015  . Allergic conjunctivitis and rhinitis 01/17/2014  . 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 Fibromyalgia 07/19/2013  . HTN (hypertension) 07/19/2013  . HLD (hyperlipidemia) 07/19/2013  . Ulcerative colitis ([place]) 07/19/2013   Past Medical History:  Diagnosis Date  . Arthritis   .  Chronic back pain   . Complication of anesthesia    increase blood pressure for not taking meds  . Depression   . Fibromyalgia   . GERD (gastroesophageal reflux disease)   . Headache   . HLD (hyperlipidemia)   . HTN (hypertension)   . Hypothyroidism following radioiodine therapy   . Pneumonia    h/o pneumonia yrs. ago  . Ulcerative colitis ([place]) 1976   Family History  Problem Relation Age of Onset  . Cancer Father   . Heart failure Mother   . Hyperlipidemia Mother   . Osteoporosis Mother   . Hyperlipidemia Brother   . Hyperlipidemia Sister    Past Surgical History:  Procedure Laterality Date  . APPENDECTOMY    . BREAST REDUCTION SURGERY    . BREAST REDUCTION SURGERY Bilateral 06/22/2005  . COLONOSCOPY W/ BIOPSIES AND POLYPECTOMY    . LUMBAR DISC SURGERY    . RADIOLOGY WITH ANESTHESIA N/A 03/30/2017   Procedure: RADIOLOGY WITH ANESTHESIA-LUMBAR SPINE WITHOUT CONTRAST;  Surgeon: Radiologist, Medication, MD;  Location: [facility];  Service: Radiology;  Laterality: N/A;  . REDUCTION MAMMAPLASTY    . TONSILLECTOMY    . UTERINE SUSPENSION    . WISDOM TOOTH EXTRACTION     Social History   Occupational History  . Occupation: Unemployed  Tobacco Use  . Smoking status: Never Smoker  . Smokeless tobacco: Never Used  Substance and Sexual Activity  . Alcohol use: No  . Drug use: No  . Sexual activity: Yes    Partners: Male    Comment: Married.

## 2017-10-18 NOTE — Procedures (Signed)
Lumbosacral Transforaminal Epidural Steroid Injection - Sub-Pedicular Approach with Fluoroscopic Guidance  Patient: Erika Osborn      Date of Birth: 02/06/1951 MRN: [id number] PCP: Briscoe Deutscher, DO      Visit Date: 10/07/2017   Universal Protocol:    Date/Time: 10/07/2017  Consent Given By: the patient  Position: PRONE  Additional Comments: Vital signs were monitored before and after the procedure. Patient was prepped and draped in the usual sterile fashion. The correct patient, procedure, and site was verified.   Injection Procedure Details:  Procedure Site One Meds Administered:  Meds ordered this encounter  Medications  . betamethasone acetate-betamethasone sodium phosphate (CELESTONE) injection 12 mg    Laterality: Right  Location/Site:  L5-S1  Needle size: 22 G  Needle type: Spinal  Needle Placement: Transforaminal  Findings:    -Comments: Excellent flow of contrast along the nerve and into the epidural space.  Procedure Details: After squaring off the end-plates to get a true AP view, the C-arm was positioned so that an oblique view of the foramen as noted above was visualized. The target area is just inferior to the "nose of the scotty dog" or sub pedicular. The soft tissues overlying this structure were infiltrated with 2-3 ml. of 1% Lidocaine without Epinephrine.  The spinal needle was inserted toward the target using a "trajectory" view along the fluoroscope beam.  Under AP and lateral visualization, the needle was advanced so it did not puncture dura and was located close the 6 O'Clock position of the pedical in AP tracterory. Biplanar projections were used to confirm position. Aspiration was confirmed to be negative for CSF and/or blood. A 1-2 ml. volume of Isovue-250 was injected and flow of contrast was noted at each level. Radiographs were obtained for documentation purposes.   After attaining the desired flow of contrast documented above, a 0.5 to 1.0  ml test dose of 0.25% Marcaine was injected into each respective transforaminal space.  The patient was observed for 90 seconds post injection.  After no sensory deficits were reported, and normal lower extremity motor function was noted,   the above injectate was administered so that equal amounts of the injectate were placed at each foramen (level) into the transforaminal epidural space.   Additional Comments:  The patient tolerated the procedure well Dressing: Band-Aid    Post-procedure details: Patient was observed during the procedure. Post-procedure instructions were reviewed.  Patient left the clinic in stable condition.

## 2017-11-26 ENCOUNTER — Other Ambulatory Visit: Payer: Self-pay | Admitting: Family Medicine

## 2017-11-30 ENCOUNTER — Ambulatory Visit: Payer: Managed Care, Other (non HMO) | Admitting: Neurology

## 2017-11-30 ENCOUNTER — Encounter: Payer: Self-pay | Admitting: Neurology

## 2017-11-30 VITALS — BP 128/68 | HR 80 | Ht 60.0 in | Wt 140.5 lb

## 2017-11-30 DIAGNOSIS — I679 Cerebrovascular disease, unspecified: Secondary | ICD-10-CM

## 2017-11-30 DIAGNOSIS — E348 Other specified endocrine disorders: Secondary | ICD-10-CM | POA: Diagnosis not present

## 2017-11-30 NOTE — Progress Notes (Signed)
PATIENT: Erika Osborn DOB: 1950-12-04  Chief Complaint  Patient presents with  . Mass of pineal region    She was referred here for further review of her recent abnormal brain MRI.  Marland Kitchen PCP    Briscoe Deutscher, DO     HISTORICAL  Erika Osborn is a 67 year old female, seen in refer by her primary care doctor Briscoe Deutscher for evaluation of abnormal MRI of brain, initial evaluation was on November 30, 2017.  She has past medical history of hypertension, hyperlipidemia, ulcerative colitis, is taking Imuran 50 mg 2 tablets every night, also has a history of scoliosis, chronic low back pain, is under the care of orthopedic surgeon Dr. Ernestina Patches, recent improvement of her low back pain after epidural injection.  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  She does complains of fatigue, blurry vision, gait difficulty due to her pain,  REVIEW OF SYSTEMS: Full 14 system review of systems performed and notable only for fatigue, hearing loss, spinning sensation, shortness of breath, feeling hot, joint pain, swelling, allergy, runny nose, sleepiness, restless leg, difficulty swallowing, dizziness, not enough sleep, decreased energy  ALLERGIES: Allergies  Allergen Reactions  . Humira [Adalimumab] Shortness Of Breath    Body feels like its on  fire and turns red  . Remicade [Infliximab] Shortness Of Breath    Body feels like its on fire and turns red  . Penicillins Rash    Childhood allergy PATIENT HAS HAD A PCN REACTION WITH IMMEDIATE RASH, FACIAL/TONGUE/THROAT SWELLING, SOB, OR LIGHTHEADEDNESS WITH HYPOTENSION:  #  #  #  YES  #  #  #   Has patient had a PCN reaction causing severe rash involving mucus membranes or skin necrosis: No Has patient had a PCN reaction that required hospitalization: No Has patient had a PCN reaction occurring within the last 10 years: No If all of the above answers are "NO", then may proceed with Cephalosporin use.   . Baclofen Other (See Comments)    confusion  . Statins     Pt. Refuses to take statins.  . Erythromycin Rash  . Latex Rash    Skin redness     HOME MEDICATIONS: Current Outpatient Medications  Medication Sig Dispense Refill  . acetaminophen (TYLENOL) 650 MG CR tablet Take 1,300 mg by mouth daily as needed for pain.    Marland Kitchen azaTHIOprine (IMURAN) 50 MG tablet Take 147m by mouth daily  5  . Bioflavonoid Products (GRAPE SEED PO) Take 200 mg by mouth daily.    . Calcium Carb-Cholecalciferol (CALCIUM 1000 + D PO) Take by mouth.    . cetirizine (ZYRTEC) 10 MG tablet Take 10 mg by mouth daily.    . Cholecalciferol (VITAMIN D3) 5000 units CAPS Take 5,000 Units by mouth daily.    . fluticasone (FLONASE) 50 MCG/ACT nasal spray PLACE 2 SPRAYS  INTO BOTH NOSTRILS DAILY 16 g 1  . folic acid (FOLVITE) 395 MCG tablet Take 400 mcg by mouth daily.    Marland Kitchen KLOR-CON 10 10 MEQ tablet TAKE 1 TABLET(10 MEQ) BY MOUTH DAILY 30 tablet 0  . levothyroxine (SYNTHROID, LEVOTHROID) 100 MCG tablet Take 1 tablet (100 mcg total) by mouth at bedtime. 90 tablet 1  . mesalamine (LIALDA) 1.2 g EC tablet Take 4.8 g by mouth daily with breakfast.    . Misc Natural Products (TART CHERRY ADVANCED) CAPS Take 844m by mouth once daily    . Multiple Vitamins-Minerals (MULTIVITAMIN & MINERAL PO) Take 1 tablet by mouth daily.       . quinapril-hydrochlorothiazide (ACCURETIC) 20-12.5 MG tablet TAKE 1 TABLET DAILY 90 tablet 0  . ranitidine (ZANTAC) 150 MG capsule Take 150 mg by mouth 2 (two) times daily.     . Red Yeast Rice Extract (RED YEAST RICE PO) Take by mouth daily.    . Soft Lens Products (OPTI-FREE PUREMOIST REWETTING) SOLN 1 drop by Does not apply route daily as needed (dry eyes).    . traMADol (ULTRAM) 50 MG tablet 2 po TID 240 tablet 2  . TURMERIC PO Take 2 capsules by mouth daily. With ginger     No current facility-administered medications for this visit.     PAST MEDICAL HISTORY: Past Medical History:  Diagnosis Date  . Arthritis   . Chronic back pain   . Complication of anesthesia    increase blood pressure for not taking meds  . Depression   . Fibromyalgia   . GERD (gastroesophageal reflux disease)   . Headache   . HLD (hyperlipidemia)   . HTN (hypertension)   . Hypothyroidism following radioiodine therapy   . Mass of pineal region   . Pneumonia    h/o pneumonia yrs. ago  . Ulcerative colitis ([facility] 1976    PAST SURGICAL HISTORY: Past Surgical History:  Procedure Laterality Date  . APPENDECTOMY    . BREAST REDUCTION SURGERY    . BREAST REDUCTION SURGERY Bilateral 06/22/2005  . COLONOSCOPY W/ BIOPSIES AND POLYPECTOMY    . LUMBAR DISC SURGERY    . RADIOLOGY WITH ANESTHESIA N/A 03/30/2017   Procedure: RADIOLOGY WITH ANESTHESIA-LUMBAR SPINE WITHOUT CONTRAST;  Surgeon: Radiologist, Medication, MD;  [facility]  Service: Radiology;  Laterality: N/A;  . REDUCTION MAMMAPLASTY    . TONSILLECTOMY    . UTERINE SUSPENSION    . WISDOM TOOTH EXTRACTION      FAMILY HISTORY: Family History  Problem Relation Age of Onset  . Cancer Father   . Heart failure Mother   . Hyperlipidemia Mother   . Osteoporosis Mother   . Hyperlipidemia Brother   . Hyperlipidemia Sister     SOCIAL HISTORY:  Social History   Socioeconomic History  . Marital status: Married    Spouse name: Not on file   . Number of children: 2  . Years of education: 132 . Highest education level: High school graduate  Occupational History  . Occupation: HAgricultural engineer Social Needs  . Financial resource strain: Not on file  . Food insecurity:    Worry: Not on file    Inability: Not on file  . Transportation needs:    Medical: Not on file    Non-medical: Not on file  Tobacco Use  . Smoking status: Never Smoker  . Smokeless tobacco: Never Used  Substance and Sexual Activity  . Alcohol use: No  . Drug use: No  . Sexual  activity: Yes    Partners: Male    Comment: Married.  Lifestyle  . Physical activity:    Days per week: Not on file    Minutes per session: Not on file  . Stress: Not on file  Relationships  . Social connections:    Talks on phone: Not on file    Gets together: Not on file    Attends religious service: Not on file    Active member of club or organization: Not on file    Attends meetings of clubs or organizations: Not on file    Relationship status: Not on file  . Intimate partner violence:    Fear of current or ex partner: Not on file    Emotionally abused: Not on file    Physically abused: Not on file    Forced sexual activity: Not on file  Other Topics Concern  . Not on file  Social History Narrative   Lives at home with husband and her disabled daughter.   Right-handed.   2.5 cups caffeine per day.     PHYSICAL EXAM   Vitals:   11/30/17 1013  BP: 128/68  Pulse: 80  Weight: 140 lb 8 oz (63.7 kg)  Height: 5' (1.524 m)    Not recorded      Body mass index is 27.44 kg/m.  PHYSICAL EXAMNIATION:  Gen: NAD, conversant, well nourised, obese, well groomed                     Cardiovascular: Regular rate rhythm, no peripheral edema, warm, nontender. Eyes: Conjunctivae clear without exudates or hemorrhage Neck: Supple, no carotid bruits. Pulmonary: Clear to auscultation bilaterally   NEUROLOGICAL EXAM:  MENTAL STATUS: Speech:    Speech is normal; fluent  and spontaneous with normal comprehension.  Cognition:     Orientation to time, place and person     Normal recent and remote memory     Normal Attention span and concentration     Normal Language, naming, repeating,spontaneous speech     Fund of knowledge   CRANIAL NERVES: CN II: Visual fields are full to confrontation. Fundoscopic exam is normal with sharp discs and no vascular changes. Pupils are round equal and briskly reactive to light. CN III, IV, VI: extraocular movement are normal. No ptosis. CN V: Facial sensation is intact to pinprick in all 3 divisions bilaterally. Corneal responses are intact.  CN VII: Face is symmetric with normal eye closure and smile. CN VIII: Hearing is normal to rubbing fingers CN IX, X: Palate elevates symmetrically. Phonation is normal. CN XI: Head turning and shoulder shrug are intact CN XII: Tongue is midline with normal movements and no atrophy.  MOTOR: There is no pronator drift of out-stretched arms. Muscle bulk and tone are normal. Muscle strength is normal.  REFLEXES: Reflexes are 2+ and symmetric at the biceps, triceps, knees, and ankles. Plantar responses are flexor.  SENSORY: Intact to light touch, pinprick, positional sensation and vibratory sensation are intact in fingers and toes.  COORDINATION: Rapid alternating movements and fine finger movements are intact. There is no dysmetria on finger-to-nose and heel-knee-shin.    GAIT/STANCE: Need to push up to get up from sitting to position, mildly antalgic,   DIAGNOSTIC DATA (LABS, IMAGING, TESTING) - I reviewed patient records, labs, notes, testing and imaging myself where available.   ASSESSMENT AND PLAN  Erika Osborn is a 67 y.o. 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 Ph.D.  [facility] [address] [place], [place] [address] Ph: [phone] Fax: [phone]  CC: Briscoe Deutscher, DO

## 2017-12-02 ENCOUNTER — Ambulatory Visit: Payer: Managed Care, Other (non HMO) | Admitting: Family Medicine

## 2017-12-02 ENCOUNTER — Encounter: Payer: Self-pay | Admitting: Family Medicine

## 2017-12-02 VITALS — BP 134/74 | HR 87 | Temp 98.6°F | Ht 60.0 in | Wt 140.2 lb

## 2017-12-02 DIAGNOSIS — J329 Chronic sinusitis, unspecified: Secondary | ICD-10-CM | POA: Diagnosis not present

## 2017-12-02 MED ORDER — LEVOFLOXACIN 500 MG PO TABS: 500.0000 mg | ORAL_TABLET | Freq: Every day | ORAL | 0 refills | Status: DC

## 2017-12-02 MED ORDER — IPRATROPIUM BROMIDE 0.06 % NA SOLN: 2.0000 | Freq: Four times a day (QID) | NASAL | 0 refills | Status: DC

## 2017-12-02 NOTE — Progress Notes (Signed)
   Subjective:  Erika Osborn is a 67 y.o. female who presents today for same-day appointment with a chief complaint of sinus congestion.   HPI:  Sinus Congestion, acute problem Started several months ago. Stable over that time. Tried using flonase which has not significantly helped. Occasional runny nose, occasional nasal sores, sore throat. No fevers or chills. Worse at night. No other  ROS: Per HPI  PMH: She reports that she has never smoked. She has never used smokeless tobacco. She reports that she does not drink alcohol or use drugs.  Objective:  Physical Exam: BP 134/74 (BP Location: Left Arm, Patient Position: Sitting, Cuff Size: Normal)   Pulse 87   Temp 98.6 F (37 C) (Oral)   Ht 5' (1.524 m)   Wt 140 lb 3.2 oz (63.6 kg)   SpO2 97%   BMI 27.38 kg/m   Gen: NAD, resting comfortably HEENT: TMs clear bilaterally.  Oropharynx clear.  Nasal mucosa boggy and erythematous with clear nasal discharge.  Maxillary sinuses with decreased transillumination bilaterally. CV: RRR with no murmurs appreciated Pulm: NWOB, CTAB with no crackles, wheezes, or rhonchi  Assessment/Plan:  Sinusitis Given that symptoms have been persistent for the past several months and maxillary sinus on her MRI, we will start antibiotics today.  She has an allergy to penicillins-we will start Levaquin today.  Offered prednisone burst, however patient declined. Will also start atrovent nasal spray. Encouraged good oral hydration.  Discussed reasons to return to care.  Follow-up as needed.  Patient stated she may request a allergy referral if your symptoms do not improve.  Algis Greenhouse. Jerline Pain, MD 12/02/2017 1:15 PM

## 2017-12-02 NOTE — Patient Instructions (Addendum)
It was very nice to see you today!  Please start the atrovent and levaquin.   Stay well-hydrated.  Let me know or let Dr. [name] know if your symptoms worsen or do not improve over the next several days.  Take care, Dr Jerline Pain

## 2017-12-08 ENCOUNTER — Encounter (INDEPENDENT_AMBULATORY_CARE_PROVIDER_SITE_OTHER): Payer: Self-pay

## 2017-12-08 ENCOUNTER — Telehealth: Payer: Self-pay | Admitting: *Deleted

## 2017-12-08 ENCOUNTER — Ambulatory Visit (HOSPITAL_COMMUNITY): Payer: Managed Care, Other (non HMO) | Attending: Cardiology

## 2017-12-08 ENCOUNTER — Other Ambulatory Visit: Payer: Self-pay

## 2017-12-08 DIAGNOSIS — M797 Fibromyalgia: Secondary | ICD-10-CM | POA: Insufficient documentation

## 2017-12-08 DIAGNOSIS — I119 Hypertensive heart disease without heart failure: Secondary | ICD-10-CM | POA: Insufficient documentation

## 2017-12-08 DIAGNOSIS — I739 Peripheral vascular disease, unspecified: Secondary | ICD-10-CM | POA: Diagnosis not present

## 2017-12-08 DIAGNOSIS — E348 Other specified endocrine disorders: Secondary | ICD-10-CM | POA: Insufficient documentation

## 2017-12-08 DIAGNOSIS — E785 Hyperlipidemia, unspecified: Secondary | ICD-10-CM | POA: Insufficient documentation

## 2017-12-08 DIAGNOSIS — I679 Cerebrovascular disease, unspecified: Secondary | ICD-10-CM

## 2017-12-08 DIAGNOSIS — M549 Dorsalgia, unspecified: Secondary | ICD-10-CM | POA: Diagnosis not present

## 2017-12-08 DIAGNOSIS — G459 Transient cerebral ischemic attack, unspecified: Secondary | ICD-10-CM | POA: Insufficient documentation

## 2017-12-08 DIAGNOSIS — G8929 Other chronic pain: Secondary | ICD-10-CM | POA: Insufficient documentation

## 2017-12-08 NOTE — Telephone Encounter (Signed)
Spoke to patient - she is aware of her results.

## 2017-12-08 NOTE — Telephone Encounter (Signed)
-----   Message from Marcial Pacas, MD sent at 12/08/2017  3:36 PM EDT ----- Please call patient for no significant abnormality on echocardiogram

## 2017-12-13 ENCOUNTER — Telehealth: Payer: Self-pay | Admitting: *Deleted

## 2017-12-13 ENCOUNTER — Ambulatory Visit (HOSPITAL_COMMUNITY)
Admission: RE | Admit: 2017-12-13 | Discharge: 2017-12-13 | Disposition: A | Discharge status: Home / Self Care | Payer: Managed Care, Other (non HMO) | Source: Ambulatory Visit | Attending: Cardiology | Admitting: Cardiology

## 2017-12-13 DIAGNOSIS — L03114 Cellulitis of left upper limb: Secondary | ICD-10-CM | POA: Insufficient documentation

## 2017-12-13 DIAGNOSIS — M79602 Pain in left arm: Secondary | ICD-10-CM | POA: Diagnosis not present

## 2017-12-13 DIAGNOSIS — I679 Cerebrovascular disease, unspecified: Secondary | ICD-10-CM

## 2017-12-13 DIAGNOSIS — M545 Low back pain: Secondary | ICD-10-CM | POA: Diagnosis not present

## 2017-12-13 DIAGNOSIS — E348 Other specified endocrine disorders: Secondary | ICD-10-CM

## 2017-12-13 DIAGNOSIS — R410 Disorientation, unspecified: Secondary | ICD-10-CM | POA: Diagnosis not present

## 2017-12-13 DIAGNOSIS — I10 Essential (primary) hypertension: Secondary | ICD-10-CM | POA: Diagnosis not present

## 2017-12-13 DIAGNOSIS — I6523 Occlusion and stenosis of bilateral carotid arteries: Secondary | ICD-10-CM | POA: Diagnosis not present

## 2017-12-13 DIAGNOSIS — E785 Hyperlipidemia, unspecified: Secondary | ICD-10-CM | POA: Diagnosis not present

## 2017-12-13 NOTE — Telephone Encounter (Signed)
-----   Message from Marcial Pacas, MD sent at 12/13/2017  3:09 PM EDT ----- Please call patient for less than 39% stenosis of bilateral internal carotid artery,

## 2017-12-16 LAB — HM COLONOSCOPY

## 2017-12-17 ENCOUNTER — Encounter: Payer: Self-pay | Admitting: *Deleted

## 2017-12-17 NOTE — Telephone Encounter (Signed)
Per vo by Dr. Krista Blue, she would like patient to start aspirin 2m daily.  Spoke to patient, she is aware of results and agreeable to start the recommended aspirin.

## 2017-12-24 ENCOUNTER — Other Ambulatory Visit: Payer: Self-pay | Admitting: Family Medicine

## 2017-12-27 ENCOUNTER — Encounter: Payer: Self-pay | Admitting: Sports Medicine

## 2017-12-27 ENCOUNTER — Ambulatory Visit: Payer: Managed Care, Other (non HMO) | Admitting: Sports Medicine

## 2017-12-27 VITALS — BP 124/82 | HR 74 | Ht 60.0 in | Wt 140.8 lb

## 2017-12-27 DIAGNOSIS — M5442 Lumbago with sciatica, left side: Secondary | ICD-10-CM | POA: Diagnosis not present

## 2017-12-27 DIAGNOSIS — G8929 Other chronic pain: Secondary | ICD-10-CM

## 2017-12-27 DIAGNOSIS — M5441 Lumbago with sciatica, right side: Secondary | ICD-10-CM | POA: Diagnosis not present

## 2017-12-27 DIAGNOSIS — M25511 Pain in right shoulder: Secondary | ICD-10-CM | POA: Diagnosis not present

## 2017-12-27 NOTE — Progress Notes (Signed)
Erika Osborn. Corrin Hingle, [place] at [facility] [phone]  Doranne Schmutz - 67 y.o. female MRN [id number]  Date of birth: [birth year]  Visit Date: 12/27/2017  PCP: Briscoe Deutscher, DO   Referred by: Briscoe Deutscher, DO  Scribe(s) for today's visit: Josepha Pigg, CMA  SUBJECTIVE:  Erika Osborn is here for Follow-up (bilateral hip pain, numbness in L leg)   09/06/2017: Compared to the last office visit, her previously described symptoms are worsening, pain is constant and more intense. She is having more difficulty moving around.  Current symptoms are moderate & are radiating to lateral aspect of thigh and posterior lower leg.  She has been going to PT at [facility]. She reports that was going well but she had a setback in Feb d/t infection in her L arm. 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 She reports anxiety during MRI despite taking 2 Ativan.  She saw Dr. Ernestina Patches 09/03/17 and discussed possible injection vs implant vs another medication.  She has been taking Tylenol and Tramadol with some relief. She has been trying to take it easy and avoid strenuous activity.   12/27/2017: Compared to the last office visit, her previously described symptoms of R hip pain are improving. She notes that the pain starts in the L hip and radiates across her lower back to the R hip and down into the L leg. She denies n/t in R leg. She cannot stand for more than 10-15 without having to sit down. L hip pain has worsened over the past 3 weeks. She reports that pain keeps her up at night.  Current L hip pain symptoms are severe & are radiating to the L leg. R hip pain is mild.  She has been taking Tylenol and Tramadol with some relief. She has been using heat with intermittent relief.  She received lumbosacral TF steroid injection with Dr. Ernestina Patches 10/07/17 and got relief about 4 days  later.   She also c/o R shoulder pain which started about 3 weeks ago. MOI is unknown. She c/o decreased ROM. She denies radiation of pain into the R arm or hand. She does report neck stiffness,"like you need to pop your neck".    REVIEW OF SYSTEMS: Reports night time disturbances. Denies fevers, chills, or night sweats. Denies unexplained weight loss. Denies personal history of cancer. Denies changes in bowel or bladder habits. Denies recent unreported falls. Denies new or worsening dyspnea or wheezing. Denies headaches or dizziness.  Reports numbness, tingling or weakness  In the extremities.  Reports dizziness or presyncopal episodes Denies lower extremity edema    HISTORY:  Prior history reviewed and updated per electronic medical record.  Social History   Occupational History  . Occupation: Homemaker  Tobacco Use  . Smoking status: Never Smoker  . Smokeless tobacco: Never Used  Substance and Sexual Activity  . Alcohol use: No  . Drug use: No  . Sexual activity: Yes    Partners: Male    Comment: Married.   Social History   Social History Narrative   Lives at home with husband and daughters.   Right-handed.   2.5 cups caffeine per day.     DATA OBTAINED & REVIEWED:  No results for input(s): HGBA1C, LABURIC, CREATINE in the last 8760 hours. MRI lumbar spine March 30, 2017 with anesthesia revealed: 1. Multilevel  spondylosis of the cervical spine with moderate dextroconvex scoliosis. 2. Mild left subarticular and foraminal narrowing at L1-2 and L2-3. 3. Mild left subarticular moderate left foraminal stenosis at L3-4. 4. Mild right subarticular and moderate right foraminal stenosis at L4-5. 5. 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  Status post multiple lumbar epidural steroid injections with Dr. Ernestina Patches: 1. 04/19/2017 2. 10/07/2017   OBJECTIVE:  VS:  HT:5'  (152.4 cm)   WT:140 lb 12.8 oz (63.9 kg)  BMI:27.5    BP:124/82  HR:74bpm  TEMP: ( )  RESP:98 %   PHYSICAL EXAM: CONSTITUTIONAL: Well-developed, Well-nourished and In no acute distress PSYCHIATRIC: Alert & appropriately interactive. and Not depressed or anxious appearing. RESPIRATORY: No increased work of breathing and Trachea Midline EYES: Pupils are equal., EOM intact without nystagmus. and No scleral icterus.  VASCULAR EXAM: Warm and well perfused NEURO: unremarkable Normal associated myotomal distribution strength to manual muscle testing Normal sensation to light touch Generalized hyperesthesia of multiple tender points.  MSK Exam: Right shoulder  Well aligned, no significant deformity. No overlying skin changes. No focal bony tenderness   RANGE OF MOTION & STRENGTH  Positive Hawkins and Neer's. Internal rotation and external rotation strength is intact although limited.   SPECIALITY TESTING:       Negative straight leg raise today.  Still walks with a slightly crouched forward and antalgic gait.  ASSESSMENT   1. Chronic bilateral low back pain with bilateral sciatica   2. Right shoulder pain, unspecified chronicity     PLAN:  Pertinent additional documentation may be included in corresponding procedure notes, imaging studies, problem based documentation and patient instructions.  Procedures:  . Landmark Guided injection performed per procedure note  Medications:  No orders of the defined types were placed in this encounter.  Discussion/Instructions: No problem-specific Assessment & Plan notes found for this encounter.  . She has done quite well with the epidural steroid injections with Dr. Ernestina Patches and is progressing well.  Her back pain does continue to bother her on a fairly regular basis but as long she remains active this seems to be under good control.  . Right shoulder has evidence of impingement.  I would like for her to begin on home therapeutic  exercises today been provided to her and she underwent a subacromial injection today.   . Continue previously prescribed home exercise program.  . Discussed red flag symptoms that warrant earlier emergent evaluation and patient voices understanding. . Activity modifications and the importance of avoiding exacerbating activities (limiting pain to no more than a 4 / 10 during or following activity) recommended and discussed.  Follow-up:  . Return in about 8 weeks (around 02/21/2018) for repeat clinical exam.  . If any lack of improvement consider: . further diagnostic evaluation with Further diagnostic evaluation of the shoulder and consideration of referral back to Dr. Ernestina Patches for ongoing back pain.     CMA/ATC served as Education administrator during this visit. History, Physical, and Plan performed by medical provider. Documentation and orders reviewed and attested to.      [name], [place] [facility] Physician

## 2017-12-27 NOTE — Patient Instructions (Signed)

## 2017-12-27 NOTE — Progress Notes (Signed)
PROCEDURE NOTE:  Landmark Guided: Injection: Right shoulder, subacromial  DESCRIPTION OF PROCEDURE:  The patient's clinical condition is marked by substantial pain and/or significant functional disability. Other conservative therapy has not provided relief, is contraindicated, or not appropriate. There is a reasonable likelihood that injection will significantly improve the patient's pain and/or functional impairment.   After discussing the risks, benefits and expected outcomes of the injection and all questions were reviewed and answered, the patient wished to undergo the above named procedure.  Verbal consent was obtained. The skin was then prepped in sterile fashion and the target structure was injected as below:   Single injection performed as below:  PREP: Alcohol and Ethel Chloride APPROACH: posterior, single injection, 22g 1.5 in. INJECTATE: 3 cc 0.5% Marcaine and 1 cc 30m/mL DepoMedrol ASPIRATE: None DRESSING: Band-Aid  Post procedural instructions including recommending icing and warning signs for infection were reviewed.    This procedure was well tolerated and there were no complications.

## 2017-12-29 ENCOUNTER — Other Ambulatory Visit: Payer: Self-pay | Admitting: Family Medicine

## 2017-12-29 DIAGNOSIS — M797 Fibromyalgia: Secondary | ICD-10-CM

## 2017-12-29 DIAGNOSIS — M25562 Pain in left knee: Principal | ICD-10-CM

## 2017-12-29 DIAGNOSIS — G8929 Other chronic pain: Secondary | ICD-10-CM

## 2017-12-29 DIAGNOSIS — M25561 Pain in right knee: Principal | ICD-10-CM

## 2017-12-29 DIAGNOSIS — M5441 Lumbago with sciatica, right side: Secondary | ICD-10-CM

## 2017-12-29 DIAGNOSIS — M5442 Lumbago with sciatica, left side: Secondary | ICD-10-CM

## 2017-12-31 ENCOUNTER — Other Ambulatory Visit: Payer: Self-pay | Admitting: Family Medicine

## 2018-01-19 ENCOUNTER — Telehealth: Payer: Self-pay | Admitting: Neurology

## 2018-01-19 NOTE — Telephone Encounter (Signed)
I went over MRI findings with patient and her husband,  Echocardiogram normal Ultrasound less than 39% stenosis at  bilateral internal carotid  She is able to tolerate aspirin 81 mg daily  Melatonin 72m qhs for insomnia

## 2018-02-22 ENCOUNTER — Ambulatory Visit: Payer: Managed Care, Other (non HMO) | Admitting: Sports Medicine

## 2018-02-22 ENCOUNTER — Encounter: Payer: Self-pay | Admitting: Sports Medicine

## 2018-02-22 VITALS — BP 120/80 | HR 81 | Ht 60.0 in | Wt 141.6 lb

## 2018-02-22 DIAGNOSIS — G8929 Other chronic pain: Secondary | ICD-10-CM

## 2018-02-22 DIAGNOSIS — M25562 Pain in left knee: Secondary | ICD-10-CM

## 2018-02-22 DIAGNOSIS — M5442 Lumbago with sciatica, left side: Secondary | ICD-10-CM

## 2018-02-22 DIAGNOSIS — M797 Fibromyalgia: Secondary | ICD-10-CM | POA: Diagnosis not present

## 2018-02-22 DIAGNOSIS — M25552 Pain in left hip: Secondary | ICD-10-CM

## 2018-02-22 DIAGNOSIS — M5441 Lumbago with sciatica, right side: Secondary | ICD-10-CM

## 2018-02-22 DIAGNOSIS — M25511 Pain in right shoulder: Secondary | ICD-10-CM | POA: Diagnosis not present

## 2018-02-22 DIAGNOSIS — M25561 Pain in right knee: Secondary | ICD-10-CM

## 2018-02-22 DIAGNOSIS — M25551 Pain in right hip: Secondary | ICD-10-CM

## 2018-02-22 NOTE — Progress Notes (Signed)
Erika Osborn. Erika Osborn, [place] at [facility] [phone]  Erika Osborn - 67 y.o. female MRN [id number]  Date of birth: 1950-07-25  Visit Date: 02/22/2018  PCP: Briscoe Deutscher, DO   Referred by: Briscoe Deutscher, DO  Scribe(s) for today's visit: Wendy Poet, LAT, ATC  SUBJECTIVE:  Erika Osborn is here for Follow-up (LBP and r shld pain) .  09/06/2017: Compared to the last office visit, her previously described symptoms are worsening, pain is constant and more intense. She is having more difficulty moving around.  Current symptoms are moderate & are radiating to lateral aspect of thigh and posterior lower leg.  She has been going to PT at [facility]. She reports that was going well but she had a setback in Feb d/t infection in her L arm. 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 She reports anxiety during MRI despite taking 2 Ativan.  She saw Dr. Ernestina Patches 09/03/17 and discussed possible injection vs implant vs another medication.  She has been taking Tylenol and Tramadol with some relief. She has been trying to take it easy and avoid strenuous activity.   12/27/2017: Compared to the last office visit, her previously described symptoms of R hip pain are improving. She notes that the pain starts in the L hip and radiates across her lower back to the R hip and down into the L leg. She denies n/t in R leg. She cannot stand for more than 10-15 without having to sit down. L hip pain has worsened over the past 3 weeks. She reports that pain keeps her up at night.  Current L hip pain symptoms are severe & are radiating to the L leg. R hip pain is mild.  She has been taking Tylenol and Tramadol with some relief. She has been using heat with intermittent relief.  She received lumbosacral TF steroid injection with Dr. Ernestina Patches 10/07/17 and got relief about 4 days later.   She  also c/o R shoulder pain which started about 3 weeks ago. MOI is unknown. She c/o decreased ROM. She denies radiation of pain into the R arm or hand. She does report neck stiffness,"like you need to pop your neck".   02/22/2018: Compared to the last office visit on 12/27/17, her previously described LBP, B hip symptoms are improving w/ decreased pain frequency. Current symptoms are moderate & are radiating to L leg intermittently. She has been taking Tylenol and Tramadol.  She had a lumbosacral TF injection w/ Dr. Ernestina Patches - 10/07/17.  She is currently doing aquatic therapy.  L-spine XR - 03/30/17  REVIEW OF SYSTEMS: Reports night time disturbances. Denies fevers, chills, or night sweats. Denies unexplained weight loss. Denies personal history of cancer. Denies changes in bowel or bladder habits. Denies recent unreported falls. Denies new or worsening dyspnea or wheezing. Reports headaches or dizziness.  Reports numbness, tingling or weakness  In the extremities - in the L LE Reports dizziness or presyncopal episodes Denies lower extremity edema    HISTORY:  Prior history reviewed and updated per electronic medical record.  Social History   Occupational History  . Occupation: Homemaker  Tobacco Use  . Smoking status: Never Smoker  . Smokeless tobacco: Never Used  Substance and Sexual Activity  . Alcohol use: No  . Drug use: No  . Sexual activity: Yes  Partners: Male    Comment: Married.   Social History   Social History Narrative   Lives at home with husband and daughters.   Right-handed.   2.5 cups caffeine per day.     DATA OBTAINED & REVIEWED:  No results for input(s): HGBA1C, LABURIC, CREATINE in the last 8760 hours. MRI lumbar spine March 30, 2017 with anesthesia revealed: 1. Multilevel spondylosis of the cervical spine with moderate dextroconvex scoliosis. 2. Mild left subarticular and foraminal narrowing at L1-2 and L2-3. 3. Mild left subarticular moderate  left foraminal stenosis at L3-4. 4. Mild right subarticular and moderate right foraminal stenosis at L4-5. 5. 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  Status post multiple lumbar epidural steroid injections with Dr. Ernestina Patches: 1. 04/19/2017 2. 10/07/2017 S/p right subacromial injection on 12/27/2017  OBJECTIVE:  VS:  HT:5' (152.4 cm)   WT:141 lb 9.6 oz (64.2 kg)  BMI:27.65    BP:120/80  HR:81bpm  TEMP: ( )  RESP:97 %   PHYSICAL EXAM: CONSTITUTIONAL: Well-developed, Well-nourished and In no acute distress PSYCHIATRIC: Alert & appropriately interactive. and Not depressed or anxious appearing. RESPIRATORY: No increased work of breathing and Trachea Midline EYES: Pupils are equal., EOM intact without nystagmus. and No scleral icterus.  VASCULAR EXAM: Warm and well perfused NEURO: unremarkable Normal associated myotomal distribution strength to manual muscle testing Normal sensation to light touch Generalized hyperesthesia of multiple tender points.  MSK Exam: Right shoulder  Well aligned, no significant deformity. No overlying skin changes. No focal bony tenderness      ASSESSMENT   1. Fibromyalgia   2. Chronic pain of both knees   3. Right shoulder pain, unspecified chronicity   4. Chronic bilateral low back pain with bilateral sciatica   5. Pain of both hip joints     PLAN:  Pertinent additional documentation may be included in corresponding procedure notes, imaging studies, problem based documentation and patient instructions.  Procedures:  . None  Medications:  No orders of the defined types were placed in this encounter.  Discussion/Instructions: No problem-specific Assessment & Plan notes found for this encounter.  . She has done quite well with the epidural steroid injections with Dr. Ernestina Patches and is progressing well.  Her back pain does continue to bother her  on a fairly regular basis but as long she remains active this seems to be under good control.   . Discussed the underlying features of tight hip flexors leading to crouched, fetal like position that results in spinal column compression.  Including lumbar hyperflexion with hypermobility, thoracic flexion with restrictive rotation and cervical lordosis reversal  . Continue previously prescribed home exercise program.  . Discussed red flag symptoms that warrant earlier emergent evaluation and patient voices understanding. . Activity modifications and the importance of avoiding exacerbating activities (limiting pain to no more than a 4 / 10 during or following activity) recommended and discussed.  Follow-up:  . Return if symptoms worsen or fail to improve.  . Consider referral to physical therapy or referral back to Dr. Ernestina Patches if persistent ongoing symptoms.     CMA/ATC served as Education administrator during this visit. History, Physical, and Plan performed by medical provider. Documentation and orders reviewed and attested to.      [name], [place] [facility] Physician

## 2018-02-23 ENCOUNTER — Other Ambulatory Visit: Payer: Self-pay | Admitting: Family Medicine

## 2018-02-23 DIAGNOSIS — M25562 Pain in left knee: Principal | ICD-10-CM

## 2018-02-23 DIAGNOSIS — M5442 Lumbago with sciatica, left side: Secondary | ICD-10-CM

## 2018-02-23 DIAGNOSIS — M797 Fibromyalgia: Secondary | ICD-10-CM

## 2018-02-23 DIAGNOSIS — G8929 Other chronic pain: Secondary | ICD-10-CM

## 2018-02-23 DIAGNOSIS — M25561 Pain in right knee: Principal | ICD-10-CM

## 2018-02-23 DIAGNOSIS — M5441 Lumbago with sciatica, right side: Secondary | ICD-10-CM

## 2018-02-23 NOTE — Telephone Encounter (Signed)
Please refill.

## 2018-03-08 ENCOUNTER — Encounter: Payer: Self-pay | Admitting: Sports Medicine

## 2018-03-15 ENCOUNTER — Other Ambulatory Visit: Payer: Self-pay | Admitting: Physician Assistant

## 2018-03-15 DIAGNOSIS — M5442 Lumbago with sciatica, left side: Secondary | ICD-10-CM

## 2018-03-15 DIAGNOSIS — M25562 Pain in left knee: Principal | ICD-10-CM

## 2018-03-15 DIAGNOSIS — M25561 Pain in right knee: Principal | ICD-10-CM

## 2018-03-15 DIAGNOSIS — M5441 Lumbago with sciatica, right side: Secondary | ICD-10-CM

## 2018-03-15 DIAGNOSIS — M797 Fibromyalgia: Secondary | ICD-10-CM

## 2018-03-15 DIAGNOSIS — G8929 Other chronic pain: Secondary | ICD-10-CM

## 2018-03-15 NOTE — Telephone Encounter (Signed)
OK to refill

## 2018-03-17 NOTE — Progress Notes (Signed)
Erika Osborn is a 67 y.o. female here for an acute visit.  History of Present Illness:   Erika Osborn, CMA acting as scribe for Dr. Briscoe Deutscher.   HPI: Patient in office for evaluation of congestion, cough and sore throat for one week. She feels like she is having swelling in face under eyes and at top of nose that started about 10 days ago. No fever. She did have loose stools this morning.  She has had mild headache off and on but not bad. She has not tried any over the counter treatments. She has been on flonase in the past but ran out about two months ago. Refill has been called in. She was given Atrovent by Dr. Jerline Pain but stopped taking make her sick to her stomach.   She did have a episode of coughing that she felt like her throat was getting tight and it was hard to breath. She has no history of asthma. It lasted about 10 minutes and got better after she was able to make herself calm her breathing.    Patient requested copy of labs done on 08/25/17. It was printed and given in office today.   PMHx, SurgHx, SocialHx, Medications, and Allergies were reviewed in the Visit Navigator and updated as appropriate.  Current Medications:   .  acetaminophen (TYLENOL) 650 MG CR tablet, Take 1,300 mg by mouth daily as needed for pain., Disp: , Rfl:  .  aspirin 81 MG tablet, Take 81 mg by mouth daily., Disp: , Rfl:  .  azaTHIOprine (IMURAN) 50 MG tablet, Take 162m by mouth daily, Disp: , Rfl: 5 .  Bioflavonoid Products (GRAPE SEED PO), Take 200 mg by mouth daily., Disp: , Rfl:  .  Calcium Carb-Cholecalciferol (CALCIUM 1000 + D PO), Take by mouth., Disp: , Rfl:  .  cetirizine (ZYRTEC) 10 MG tablet, Take 10 mg by mouth daily., Disp: , Rfl:  .  Cholecalciferol (VITAMIN D3) 5000 units CAPS, Take 5,000 Units by mouth daily., Disp: , Rfl:  .  fluticasone (FLONASE) 50 MCG/ACT nasal spray, PLACE 2 SPRAYS INTO BOTH NOSTRILS DAILY, Disp: 16 g, Rfl: 1 .  folic acid (FOLVITE) 4294MCG tablet, Take 400  mcg by mouth daily., Disp: , Rfl:  .  ipratropium (ATROVENT) 0.06 % nasal spray, Place 2 sprays into both nostrils 4 (four) times daily., Disp: 15 mL, Rfl: 0 .  levothyroxine (SYNTHROID, LEVOTHROID) 100 MCG tablet, TAKE 1 TABLET AT BEDTIME, Disp: 90 tablet, Rfl: 1 .  mesalamine (LIALDA) 1.2 g EC tablet, Take 4.8 g by mouth daily with breakfast., Disp: , Rfl:  .  Misc Natural Products (TART CHERRY ADVANCED) CAPS, Take 8071mby mouth once daily, Disp: , Rfl:  .  Multiple Vitamins-Minerals (MULTIVITAMIN & MINERAL PO), Take 1 tablet by mouth daily. , Disp: , Rfl:  .  Polyethyl Glycol-Propyl Glycol (SYSTANE ULTRA PF OP), Apply to eye., Disp: , Rfl:  .  potassium chloride (K-DUR) 10 MEQ tablet, TAKE 1 TABLET(10 MEQ) BY MOUTH DAILY, Disp: 90 tablet, Rfl: 1 .  quinapril-hydrochlorothiazide (ACCURETIC) 20-12.5 MG tablet, TAKE 1 TABLET DAILY, Disp: 90 tablet, Rfl: 1 .  ranitidine (ZANTAC) 150 MG capsule, Take 150 mg by mouth 2 (two) times daily. , Disp: , Rfl:  .  Red Yeast Rice Extract (RED YEAST RICE PO), Take by mouth daily., Disp: , Rfl:  .  Soft Lens Products (OPTI-FREE PUREMOIST REWETTING) SOLN, 1 drop by Does not apply route daily as needed (dry eyes)., Disp: ,  Rfl:  .  traMADol (ULTRAM) 50 MG tablet, TAKE 2 TABLETS BY MOUTH THREE TIMES DAILY, Disp: 90 tablet, Rfl: 0 .  TURMERIC PO, Take 2 capsules by mouth daily. With ginger, Disp: , Rfl:    Allergies  Allergen Reactions  . Humira [Adalimumab] Shortness Of Breath    Body feels like its on fire and turns red  . Remicade [Infliximab] Shortness Of Breath    Body feels like its on fire and turns red  . Penicillins Rash    Childhood allergy PATIENT HAS HAD A PCN REACTION WITH IMMEDIATE RASH, FACIAL/TONGUE/THROAT SWELLING, SOB, OR LIGHTHEADEDNESS WITH HYPOTENSION:  #  #  #  YES  #  #  #   Has patient had a PCN reaction causing severe rash involving mucus membranes or skin necrosis: No Has patient had a PCN reaction that required hospitalization:  No Has patient had a PCN reaction occurring within the last 10 years: No If all of the above answers are "NO", then may proceed with Cephalosporin use.   . Baclofen Other (See Comments)    confusion  . Statins     Pt. Refuses to take statins.  . Erythromycin Rash  . Latex Rash    Skin redness    Review of Systems:   Pertinent items are noted in the HPI. Otherwise, ROS is negative.  Vitals:   Vitals:   03/18/18 0937  BP: 134/82  Pulse: 78  Temp: 98.8 F (37.1 C)  TempSrc: Oral  SpO2: 95%  Weight: 141 lb 3.2 oz (64 kg)  Height: 5' (1.524 m)     Body mass index is 27.58 kg/m.  Physical Exam:   Physical Exam  Constitutional: She appears well-nourished.  HENT:  Head: Normocephalic and atraumatic.  Nose: Mucosal edema and rhinorrhea present. Right sinus exhibits maxillary sinus tenderness and frontal sinus tenderness. Left sinus exhibits maxillary sinus tenderness and frontal sinus tenderness.  Eyes: Pupils are equal, round, and reactive to light. EOM are normal.  Neck: Normal range of motion. Neck supple.  Cardiovascular: Normal rate, regular rhythm, normal heart sounds and intact distal pulses.  Pulmonary/Chest: Effort normal.  Abdominal: Soft.  Skin: Skin is warm.  Psychiatric: She has a normal mood and affect. Her behavior is normal.  Nursing note and vitals reviewed.  Assessment and Plan:   Erika Osborn was seen today for sinusitis.  Diagnoses and all orders for this visit:  Chronic maxillary sinusitis -     cefdinir (OMNICEF) 300 MG capsule; Take 1 capsule (300 mg total) by mouth 2 (two) times daily. -     fluconazole (DIFLUCAN) 150 MG tablet; Take 1 tablet (150 mg total) by mouth once for 1 dose. Take one tablet then 3 days later take another.  Congestion of nasal sinus -     fluticasone (FLONASE) 50 MCG/ACT nasal spray; Place 2 sprays into both nostrils daily.  Need for immunization against influenza -     Flu Vaccine QUAD 36+ mos IM  Gastroesophageal  reflux disease without esophagitis -     omeprazole (PRILOSEC) 20 MG capsule; Take 1 capsule (20 mg total) by mouth 2 (two) times daily.    . Reviewed expectations re: course of current medical issues. . Discussed self-management of symptoms. . Outlined signs and symptoms indicating need for more acute intervention. . Patient verbalized understanding and all questions were answered. Marland Kitchen Health Maintenance issues including appropriate healthy diet, exercise, and smoking avoidance were discussed with patient. . See orders for this visit as  documented in the electronic medical record. . Patient received an After Visit Summary.  CMA served as Education administrator during this visit. History, Physical, and Plan performed by medical provider. The above documentation has been reviewed and is accurate and complete. Briscoe Deutscher, D.O.  Briscoe Deutscher, DO [place], [facility] 03/20/2018

## 2018-03-18 ENCOUNTER — Encounter

## 2018-03-18 ENCOUNTER — Encounter: Payer: Self-pay | Admitting: Family Medicine

## 2018-03-18 ENCOUNTER — Ambulatory Visit: Payer: Managed Care, Other (non HMO) | Admitting: Family Medicine

## 2018-03-18 VITALS — BP 134/82 | HR 78 | Temp 98.8°F | Ht 60.0 in | Wt 141.2 lb

## 2018-03-18 DIAGNOSIS — R0981 Nasal congestion: Secondary | ICD-10-CM | POA: Diagnosis not present

## 2018-03-18 DIAGNOSIS — J320 Chronic maxillary sinusitis: Secondary | ICD-10-CM | POA: Diagnosis not present

## 2018-03-18 DIAGNOSIS — K219 Gastro-esophageal reflux disease without esophagitis: Secondary | ICD-10-CM

## 2018-03-18 DIAGNOSIS — Z23 Encounter for immunization: Secondary | ICD-10-CM | POA: Diagnosis not present

## 2018-03-18 MED ORDER — FLUCONAZOLE 150 MG PO TABS: 150.0000 mg | ORAL_TABLET | Freq: Once | ORAL | 0 refills | Status: AC

## 2018-03-18 MED ORDER — CEFDINIR 300 MG PO CAPS: 300.0000 mg | ORAL_CAPSULE | Freq: Two times a day (BID) | ORAL | 0 refills | Status: DC

## 2018-03-18 MED ORDER — OMEPRAZOLE 20 MG PO CPDR: 20.0000 mg | DELAYED_RELEASE_CAPSULE | Freq: Two times a day (BID) | ORAL | 3 refills | Status: DC

## 2018-03-18 MED ORDER — FLUTICASONE PROPIONATE 50 MCG/ACT NA SUSP: 2.0000 | Freq: Every day | NASAL | 1 refills | Status: DC

## 2018-03-20 ENCOUNTER — Encounter: Payer: Self-pay | Admitting: Family Medicine

## 2018-04-04 ENCOUNTER — Other Ambulatory Visit: Payer: Self-pay | Admitting: Family Medicine

## 2018-04-04 DIAGNOSIS — G8929 Other chronic pain: Secondary | ICD-10-CM

## 2018-04-04 DIAGNOSIS — M5441 Lumbago with sciatica, right side: Secondary | ICD-10-CM

## 2018-04-04 DIAGNOSIS — M5442 Lumbago with sciatica, left side: Secondary | ICD-10-CM

## 2018-04-04 DIAGNOSIS — M797 Fibromyalgia: Secondary | ICD-10-CM

## 2018-04-04 DIAGNOSIS — M25561 Pain in right knee: Principal | ICD-10-CM

## 2018-04-04 DIAGNOSIS — M25562 Pain in left knee: Principal | ICD-10-CM

## 2018-04-04 NOTE — Telephone Encounter (Signed)
Copied from [place] [id number]. Topic: Quick Communication - Rx Refill/Question >> Apr 04, 2018  9:03 AM Yvette Rack wrote: Medication: traMADol (ULTRAM) 50 MG tablet 160 tabs pt take three times daily 6 pills a day  Has the patient contacted their pharmacy? Yes.   (Agent: If no, request that the patient contact the pharmacy for the refill.) (Agent: If yes, when and what did the pharmacy advise?) call your provider pt spoke with them today  Preferred Pharmacy (with phone number or street name): [facility] [place], [place] - [address] [phone] (Phone) [phone] (Fax)    Agent: Please be advised that RX refills may take up to 3 business days. We ask that you follow-up with your pharmacy.

## 2018-04-04 NOTE — Telephone Encounter (Signed)
Please advise on refill.

## 2018-04-05 NOTE — Telephone Encounter (Signed)
Requested medication (s) are due for refill today: yes  Requested medication (s) are on the active medication list: yes  Last refill: 03/15/18  Future visit scheduled: yes  Notes to clinic:  undelegated    Requested Prescriptions  Pending Prescriptions Disp Refills   traMADol (ULTRAM) 50 MG tablet 90 tablet 0    Sig: Take 2 tablets (100 mg total) by mouth 3 (three) times daily.     Not Delegated - Analgesics:  Opioid Agonists Failed - 04/04/2018 11:35 AM      Failed - This refill cannot be delegated      Failed - Urine Drug Screen completed in last 360 days.      Passed - Valid encounter within last 6 months    Recent Outpatient Visits          2 weeks ago Chronic maxillary sinusitis   [place], [place]   1 month ago Dozier, Legrand Como D, DO   3 months ago Chronic bilateral low back pain with bilateral sciatica   [place], Legrand Como D, DO   4 months ago Sinusitis, unspecified chronicity, unspecified location   [place], Algis Greenhouse, MD   7 months ago [place] Rigby, [name], DO      Future Appointments            In 2 months Briscoe Deutscher, [place], [place]   In 3 months Bo Merino, MD [facility]

## 2018-05-06 ENCOUNTER — Other Ambulatory Visit: Payer: Self-pay

## 2018-05-06 ENCOUNTER — Ambulatory Visit: Payer: Self-pay

## 2018-05-06 MED ORDER — FAMOTIDINE 40 MG/5ML PO SUSR: 40.0000 mg | Freq: Every day | ORAL | 6 refills | Status: AC

## 2018-05-06 NOTE — Telephone Encounter (Signed)
Do you want her to come in for f/u

## 2018-05-06 NOTE — Telephone Encounter (Signed)
Pepcid 50 mg per 5 mL - to take 5 mL each night. #qs.

## 2018-05-06 NOTE — Telephone Encounter (Signed)
Pt. Reports she was changed from Zantac to Omeprazole 2 weeks ago. Has had some nausea, vomiting and burping and stomach cramps since being on the medication. Would like something different called in. [facility] at [address]. Please advise pt.

## 2018-05-06 NOTE — Telephone Encounter (Signed)
I thought we changed to Pepcid? Please call patient's mother - she is a great historian and very in tune with her daughter's needs.

## 2018-05-06 NOTE — Telephone Encounter (Signed)
Dr. [name] according to chart pt was changed to Omeprazole 20 mg BID. Please advise.

## 2018-05-06 NOTE — Telephone Encounter (Signed)
Did not come in 23 so I called in 13m/5ml patient informed.

## 2018-05-10 ENCOUNTER — Telehealth: Payer: Self-pay | Admitting: Neurology

## 2018-05-10 ENCOUNTER — Other Ambulatory Visit: Payer: Self-pay | Admitting: Family Medicine

## 2018-05-10 DIAGNOSIS — M5441 Lumbago with sciatica, right side: Secondary | ICD-10-CM

## 2018-05-10 DIAGNOSIS — M797 Fibromyalgia: Secondary | ICD-10-CM

## 2018-05-10 DIAGNOSIS — M25562 Pain in left knee: Principal | ICD-10-CM

## 2018-05-10 DIAGNOSIS — M25561 Pain in right knee: Principal | ICD-10-CM

## 2018-05-10 DIAGNOSIS — M5442 Lumbago with sciatica, left side: Secondary | ICD-10-CM

## 2018-05-10 DIAGNOSIS — G8929 Other chronic pain: Secondary | ICD-10-CM

## 2018-05-10 NOTE — Telephone Encounter (Signed)
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

## 2018-05-10 NOTE — Telephone Encounter (Signed)
Returned call to patient.  Reports when she turns the light off at night, she still sees a bright light that gradually fades into an image that she is unable to make out.  Says she is not staring at the light bulb prior to shutting off the light.  This symptom has been occurring every night for approximately two months.  Per vo by Dr. 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  Dr. Krista Blue would like her to follow up with her ophthalmologist.  The patient states it is close to time for her yearly exam and she will call to make the appt.

## 2018-05-13 ENCOUNTER — Ambulatory Visit: Payer: Self-pay

## 2018-05-13 ENCOUNTER — Encounter: Payer: Self-pay | Admitting: Sports Medicine

## 2018-05-13 ENCOUNTER — Ambulatory Visit: Payer: Managed Care, Other (non HMO) | Admitting: Sports Medicine

## 2018-05-13 VITALS — BP 140/84 | HR 77 | Ht 60.0 in | Wt 146.0 lb

## 2018-05-13 DIAGNOSIS — M7551 Bursitis of right shoulder: Secondary | ICD-10-CM

## 2018-05-13 DIAGNOSIS — G8929 Other chronic pain: Secondary | ICD-10-CM | POA: Diagnosis not present

## 2018-05-13 DIAGNOSIS — M25511 Pain in right shoulder: Secondary | ICD-10-CM

## 2018-05-13 NOTE — Progress Notes (Signed)
Erika Osborn. Erika Osborn, [place] at [facility] [phone]  Erika Osborn - 67 y.o. female MRN [id number]  Date of birth: 01/09/1951  Visit Date: 05/13/2018  PCP: Briscoe Deutscher, DO   Referred by: Briscoe Deutscher, DO  Scribe(s) for today's visit: Wendy Poet, LAT, ATC  SUBJECTIVE:  Erika Osborn is here for Follow-up (R shoulder and arm pain) .  09/06/2017: Compared to the last office visit, her previously described symptoms are worsening, pain is constant and more intense. She is having more difficulty moving around.  Current symptoms are moderate & are radiating to lateral aspect of thigh and posterior lower leg.  She has been going to PT at [facility]. She reports that was going well but she had a setback in Feb d/t infection in her L arm. 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 She reports anxiety during MRI despite taking 2 Ativan.  She saw Dr. Ernestina Patches 09/03/17 and discussed possible injection vs implant vs another medication.  She has been taking Tylenol and Tramadol with some relief. She has been trying to take it easy and avoid strenuous activity.   12/27/2017: Compared to the last office visit, her previously described symptoms of R hip pain are improving. She notes that the pain starts in the L hip and radiates across her lower back to the R hip and down into the L leg. She denies n/t in R leg. She cannot stand for more than 10-15 without having to sit down. L hip pain has worsened over the past 3 weeks. She reports that pain keeps her up at night.  Current L hip pain symptoms are severe & are radiating to the L leg. R hip pain is mild.  She has been taking Tylenol and Tramadol with some relief. She has been using heat with intermittent relief.  She received lumbosacral TF steroid injection with Dr. Ernestina Patches 10/07/17 and got relief about 4 days later.    She also c/o R shoulder pain which started about 3 weeks ago. MOI is unknown. She c/o decreased ROM. She denies radiation of pain into the R arm or hand. She does report neck stiffness,"like you need to pop your neck".   02/22/2018: Compared to the last office visit on 12/27/17, her previously described LBP, B hip symptoms are improving w/ decreased pain frequency. Current symptoms are moderate & are radiating to L leg intermittently. She has been taking Tylenol and Tramadol.  She had a lumbosacral TF injection w/ Dr. Ernestina Patches - 10/07/17.  She is currently doing aquatic therapy.  05/13/2018: Compared to the last office visit on 02/22/18, her previously described R shoulder symptoms are worsening w/ increased popping in her R shoulder. Current symptoms are moderate-severe & are radiating to R upper arm.  She notes increased pain w/ rows and R shoulder rotation. She has been Tylenol and Tramadol prn.  She's also been taking 500 mg Mg.  She had a 3/1 R subacromial injection on 12/27/17.  L-spine XR - 03/30/17  REVIEW OF SYSTEMS: Denies night time disturbances. Denies fevers, chills, or night sweats. Denies unexplained weight loss. Denies personal history of cancer. Denies changes in bowel or bladder habits. Denies recent unreported falls. Denies new or worsening dyspnea or wheezing. Reports headaches or dizziness.  Reports numbness, tingling or weakness  In the extremities - in the L LE  Reports dizziness or presyncopal episodes Denies lower extremity edema    HISTORY:  Prior history reviewed and updated per electronic medical record.  Social History   Occupational History  . Occupation: Homemaker  Tobacco Use  . Smoking status: Never Smoker  . Smokeless tobacco: Never Used  Substance and Sexual Activity  . Alcohol use: No  . Drug use: No  . Sexual activity: Yes    Partners: Male    Comment: Married.   Social History   Social History Narrative   Lives at home with husband and  daughters.   Right-handed.   2.5 cups caffeine per day.    DATA OBTAINED & REVIEWED:  No results for input(s): HGBA1C, LABURIC, CREATINE in the last 8760 hours. MRI lumbar spine March 30, 2017 with anesthesia revealed: 1. Multilevel spondylosis of the cervical spine with moderate dextroconvex scoliosis. 2. Mild left subarticular and foraminal narrowing at L1-2 and L2-3. 3. Mild left subarticular moderate left foraminal stenosis at L3-4. 4. Mild right subarticular and moderate right foraminal stenosis at L4-5. 5. 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  Status post multiple lumbar epidural steroid injections with Dr. Ernestina Patches: 1. 04/19/2017 2. 10/07/2017 S/p right subacromial injection on 12/27/2017  OBJECTIVE:  VS:  HT:5' (152.4 cm)   WT:146 lb (66.2 kg)  BMI:28.51    BP:140/84  HR:77bpm  TEMP: ( )  RESP:98 %   PHYSICAL EXAM: CONSTITUTIONAL: Well-developed, Well-nourished and In no acute distress PSYCHIATRIC: Alert & appropriately interactive. and Not depressed or anxious appearing. RESPIRATORY: No increased work of breathing and Trachea Midline EYES: Pupils are equal., EOM intact without nystagmus. and No scleral icterus.  VASCULAR EXAM: Warm and well perfused NEURO: unremarkable Normal associated myotomal distribution strength to manual muscle testing Normal sensation to light touch   MSK Exam: Right shoulder  Well aligned, no significant deformity. No overlying skin changes. No focal bony tenderness  Positive Hawkins and Neer's.  Intrinsic rotator cuff strength is intact.    ASSESSMENT   1. Chronic right shoulder pain   2. Right shoulder pain, unspecified chronicity   3. Subacromial bursitis of right shoulder joint     PLAN:  Pertinent additional documentation may be included in corresponding procedure notes, imaging studies, problem based documentation and  patient instructions.  Procedures:  . US Guided Injection per procedure note  Medications:  No orders of the defined types were placed in this encounter.  Discussion/Instructions: No problem-specific Assessment & Plan notes found for this encounter.  . Fairly large bursa appreciated on ultrasound today.  Could consider nitroglycerin protocol she does have a headache history.  And given the bursal formation should do well with injection . Continue home therapeutic exercises previously prescribed. . Discussed red flag symptoms that warrant earlier emergent evaluation and patient voices understanding. . Activity modifications and the importance of avoiding exacerbating activities (limiting pain to no more than a 4 / 10 during or following activity) recommended and discussed.  Follow-up:  . Return in about 4 weeks (around 06/10/2018) for repeat clinical exam.  . If any lack of improvement: consider further diagnostic evaluation with MRI of the shoulder     CMA/ATC served as scribe during this visit. History, Physical, and Plan performed by medical provider. Documentation and orders reviewed and attested to.      [name], [facility] Physician

## 2018-05-13 NOTE — Patient Instructions (Addendum)

## 2018-05-13 NOTE — Procedures (Signed)
PROCEDURE NOTE:  Ultrasound Guided: Injection: Right shoulder, Subacromial Images were obtained and interpreted by myself, Teresa Coombs, DO  Images have been saved and stored to PACS system. Images obtained on: GE S7 Ultrasound machine    ULTRASOUND FINDINGS:  Very large subacromial bursa traversing into the anterior shoulder subdeltoid at this location.  Rotator cuff appears to be intact although there changes within the supraspinatus.  DESCRIPTION OF PROCEDURE:  The patient's clinical condition is marked by substantial pain and/or significant functional disability. Other conservative therapy has not provided relief, is contraindicated, or not appropriate. There is a reasonable likelihood that injection will significantly improve the patient's pain and/or functional impairment.   After discussing the risks, benefits and expected outcomes of the injection and all questions were reviewed and answered, the patient wished to undergo the above named procedure.  Verbal consent was obtained.  The ultrasound was used to identify the target structure and adjacent neurovascular structures. The skin was then prepped in sterile fashion and the target structure was injected under direct visualization using sterile technique as below:  Single injection performed as below: PREP: Alcohol and Ethel Chloride APPROACH:anterior, single injection, 21g 2 in. INJECTATE: 2 cc 0.5% Marcaine and 2 cc 41m/mL DepoMedrol ASPIRATE: 176m and bloody and Medication tinged as this was performed after the injection  DRESSING: Band-Aid  Post procedural instructions including recommending icing and warning signs for infection were reviewed.    This procedure was well tolerated and there were no complications.   IMPRESSION: Succesful Ultrasound Guided: Injection

## 2018-05-16 ENCOUNTER — Telehealth: Payer: Self-pay

## 2018-05-16 NOTE — Telephone Encounter (Signed)
Okay for both. Pepcid 40 mg tablet, to take BID. Please call in.

## 2018-05-16 NOTE — Telephone Encounter (Signed)
Patient is currently on Take 5 mLs (40 mg total) by mouth at bedtime wanted to know if she can  1. Change to tablet 2. Take bid . She states it helps a lot but has noticed that before she is due to take she starts having some heart burn and nasua   Pt informed will send message you are out of office but will be checking messages and will get back to her as soon as we can.

## 2018-05-17 ENCOUNTER — Other Ambulatory Visit: Payer: Self-pay

## 2018-05-17 MED ORDER — FAMOTIDINE 40 MG PO TABS: 40.0000 mg | ORAL_TABLET | Freq: Two times a day (BID) | ORAL | 3 refills | Status: DC

## 2018-05-21 ENCOUNTER — Encounter: Payer: Self-pay | Admitting: Sports Medicine

## 2018-06-07 ENCOUNTER — Other Ambulatory Visit: Payer: Self-pay | Admitting: Family Medicine

## 2018-06-07 DIAGNOSIS — M797 Fibromyalgia: Secondary | ICD-10-CM

## 2018-06-07 DIAGNOSIS — M25561 Pain in right knee: Principal | ICD-10-CM

## 2018-06-07 DIAGNOSIS — M5441 Lumbago with sciatica, right side: Secondary | ICD-10-CM

## 2018-06-07 DIAGNOSIS — G8929 Other chronic pain: Secondary | ICD-10-CM

## 2018-06-07 DIAGNOSIS — M25562 Pain in left knee: Principal | ICD-10-CM

## 2018-06-07 DIAGNOSIS — M5442 Lumbago with sciatica, left side: Secondary | ICD-10-CM

## 2018-06-07 NOTE — Telephone Encounter (Signed)
Last OV 05/13/18 Last refill 03/15/18 #90/0 Next OV 06/17/18  Forwarding to Dr. [name]

## 2018-06-09 ENCOUNTER — Ambulatory Visit: Payer: Self-pay

## 2018-06-09 ENCOUNTER — Ambulatory Visit: Payer: Managed Care, Other (non HMO) | Admitting: Sports Medicine

## 2018-06-09 ENCOUNTER — Encounter: Payer: Self-pay | Admitting: Sports Medicine

## 2018-06-09 VITALS — BP 150/98 | HR 85 | Ht 60.0 in | Wt 142.4 lb

## 2018-06-09 DIAGNOSIS — M25512 Pain in left shoulder: Secondary | ICD-10-CM

## 2018-06-09 DIAGNOSIS — M170 Bilateral primary osteoarthritis of knee: Secondary | ICD-10-CM

## 2018-06-09 NOTE — Procedures (Signed)
PROCEDURE NOTE:  Ultrasound Guided: Injection: Left shoulder Images were obtained and interpreted by myself, Teresa Coombs, DO  Images have been saved and stored to PACS system. Images obtained on: GE S7 Ultrasound machine    ULTRASOUND FINDINGS:  Moderate degenerative changes throughout the entire shoulder.  Small joint effusion.  DESCRIPTION OF PROCEDURE:  The patient's clinical condition is marked by substantial pain and/or significant functional disability. Other conservative therapy has not provided relief, is contraindicated, or not appropriate. There is a reasonable likelihood that injection will significantly improve the patient's pain and/or functional impairment.   After discussing the risks, benefits and expected outcomes of the injection and all questions were reviewed and answered, the patient wished to undergo the above named procedure.  Verbal consent was obtained.  The ultrasound was used to identify the target structure and adjacent neurovascular structures. The skin was then prepped in sterile fashion and the target structure was injected under direct visualization using sterile technique as below:  Single injection performed as below: PREP: Alcohol and Ethel Chloride APPROACH:posterior, single injection, 21g 2 in. INJECTATE: 2 cc 0.5% Marcaine and 2 cc 22m/mL DepoMedrol ASPIRATE: None DRESSING: Band-Aid  Post procedural instructions including recommending icing and warning signs for infection were reviewed.    This procedure was well tolerated and there were no complications.   IMPRESSION: Succesful Ultrasound Guided: Injection

## 2018-06-09 NOTE — Patient Instructions (Addendum)

## 2018-06-09 NOTE — Progress Notes (Signed)
  Erika Osborn. Rig[place], [place] at [facility] [phone]  Erika Osborn - 67 y.o. female MRN [id number]  Date of birth: [birth year]  Visit Date:   PCP: Briscoe Deutscher, DO   Referred by: Briscoe Deutscher, DO   SUBJECTIVE:  Chief Complaint  Patient presents with  . Follow-up    R shoulder pain    HPI: Patient is here for follow-up of left greater than right shoulder pain.  She has had 95% provement with the right shoulder.  She is noticed that left shoulder is producing moderate pain for the last 3 weeks.  She is additionally having bilateral knee and leg pain that is moderate over the past 4 days.  Her left shoulder is worsened with overhead range of motion and functional internal rotation.  Her bilateral legs have pain with weightbearing activity.  She did respond quite favorably to the right shoulder injection last month.  She is taking tramadol as well as magnesium.  She is using Biofreeze on her bilateral knees with moderate improvement.  REVIEW OF SYSTEMS: She has had nighttime disturbances due to the left shoulder pain.  There is occasional numbness and tingling into the bilateral lower extremities.  HISTORY:  Prior history reviewed and updated per electronic medical record.  Social History   Occupational History  . Occupation: Homemaker  Tobacco Use  . Smoking status: Never Smoker  . Smokeless tobacco: Never Used  Substance and Sexual Activity  . Alcohol use: No  . Drug use: No  . Sexual activity: Yes    Partners: Male    Comment: Married.   Social History   Social History Narrative   Lives at home with husband and daughters.   Right-handed.   2.5 cups caffeine per day.      OBJECTIVE:  VS:  HT:5' (152.4 cm)   WT:142 lb 6.4 oz (64.6 kg)  BMI:27.81    BP:(!) 150/98  HR:85bpm  TEMP: ( )  RESP:98 %   PHYSICAL EXAM: Adult female. No acute distress.  Alert and appropriate.  Her bilateral shoulders are well aligned.   She does have an anterior carriage.  She has positive cross body test as well as Hawkins however pain is worse with axial load and circumduction there is crepitation.  Bilateral legs are normal-appearing she has negative straight leg raise bilaterally.  Her knees have mild tenderness nonfocal.   ASSESSMENT   1. Primary osteoarthritis of both knees   2. Acute pain of left shoulder     PLAN:  Pertinent additional documentation may be included in corresponding procedure notes, imaging studies, problem based documentation and patient instructions.  Procedures:  US Guided Injection per procedure note      Medications:  No orders of the defined types were placed in this encounter.   Discussion/Instructions: No problem-specific Assessment & Plan notes found for this encounter.   Ultimately her left shoulder is more symptomatic for her and this was injected today.  Her legs are nonspecific and will possibly get some improvement from the systemic effect of the corticosteroid.  If any lack of improvement we will plan to further evaluate this with repeat evaluation of her lumbar spine or consider further injections with her knees.   Return for early January (2nd-3rd week) for knee injections.          [name], [facility] Physician

## 2018-06-16 NOTE — Progress Notes (Signed)
Erika Osborn is a 67 y.o. female is here for follow up.  History of Present Illness:   [name], CMA acting as scribe for Dr. Briscoe Deutscher.   HPI: Patient in office for follow up. She has increased sinus drainage and decreased hearing. She has noticed it for the last year.   1. Mixed hyperlipidemia. Trying to exercise on a regular basis? Yes. Compliant with diet? Yes.  Lab Results  Component Value Date   CHOL 238 (H) 06/17/2018   HDL 48.30 06/17/2018   LDLCALC 184 (H) 08/25/2017   LDLDIRECT 145.0 06/17/2018   TRIG 277.0 (H) 06/17/2018   CHOLHDL 5 06/17/2018   Lab Results  Component Value Date   ALT 19 06/17/2018   AST 18 06/17/2018   ALKPHOS 84 06/17/2018   BILITOT 0.3 06/17/2018      2. Chronic pain of both knees. Followed by Dr. Paulla Fore.   3. Chronic bilateral low back pain with bilateral sciatica. Chronic history of back and hip and leg pain particularly on the right.  The right hip and leg pain really is worse than her back pain at this point.  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  She has other levels of spondylosis as well but without central canal stenosis.    4. Fibromyalgia.  See orders for pain management.   5. Chronic maxillary sinusitis. Acute on chronic. Sinus pain, pressure, congestion. No fever. Minimal cough. OTC medications not helping.     6. Essential hypertension.  Review: taking medications as instructed, no medication side effects noted, no TIAs, no chest pain on exertion, no dyspnea on exertion, no swelling of ankles. Smoker: No.   BP Readings from Last 3 Encounters:  06/17/18 (!) 144/88  06/09/18 (!) 150/98  05/13/18 140/84   Lab Results  Component Value Date   CREATININE 0.65 06/17/2018   CREATININE 0.60 08/25/2017   CREATININE 0.61 08/20/2017       7. Hypothyroidism, postradioiodine therapy.  Current symptoms: none. Patient denies change in energy level, diarrhea,  heat / cold intolerance, nervousness, palpitations and weight changes. Symptoms have been well-controlled.  Lab Results  Component Value Date   TSH 0.77 06/17/2018   TSH 0.22 (L) 08/17/2017   TSH 2.23 02/08/2017   Lab Results  Component Value Date   FREET4 1.22 06/17/2018   FREET4 1.04 08/17/2017   FREET4 1.13 02/08/2017        Health Maintenance Due  Topic Date Due  . COLONOSCOPY  12/15/1999   Depression screen [facility] 2/9 06/17/2018 08/17/2017 08/08/2016  Decreased Interest 0 3 0  Down, Depressed, Hopeless 0 1 0  PHQ - 2 Score 0 4 0  Altered sleeping 3 3 -  Tired, decreased energy 3 3 -  Change in appetite 3 2 -  Feeling bad or failure about yourself  0 0 -  Trouble concentrating 0 0 -  Moving slowly or fidgety/restless 0 0 -  Suicidal thoughts 0 0 -  PHQ-9 Score 9 12 -  Difficult doing work/chores Not difficult at all - -   PMHx, SurgHx, SocialHx, FamHx, Medications, and Allergies were reviewed in the Visit Navigator and updated as appropriate.   Patient Active Problem List   Diagnosis Date Noted  . 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 Small vessel disease, cerebrovascular 11/30/2017  . 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 Anemia 08/29/2017  . Elevated MCV 03/28/2017  . Elevated LFTs 03/28/2017  .  Primary osteoarthritis of both hands 01/28/2017  . ANA positive 01/26/2017  . Positive anti-CCP test 11/30/2016  . Chronic bilateral low back pain with bilateral sciatica 11/16/2016  . Osteoarthritis of spine with radiculopathy, lumbar region 11/16/2016  . Polyarthralgia 11/16/2016  . Primary osteoarthritis of both knees 11/16/2016  . Onychomycosis 11/22/2015  . Overweight (BMI 25.0-29.9) 06/25/2015  . 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 Allergic conjunctivitis and rhinitis 01/17/2014  . 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 Fibromyalgia 07/19/2013  . HTN (hypertension) 07/19/2013  . HLD (hyperlipidemia) 07/19/2013  . Ulcerative colitis ([place]) 07/19/2013   Social  History   Tobacco Use  . Smoking status: Never Smoker  . Smokeless tobacco: Never Used  Substance Use Topics  . Alcohol use: No  . Drug use: No   Current Medications and Allergies:   .  acetaminophen (TYLENOL) 650 MG CR tablet, Take 1,300 mg by mouth daily as needed for pain., Disp: , Rfl:  .  aspirin 81 MG tablet, Take 81 mg by mouth daily., Disp: , Rfl:  .  azaTHIOprine (IMURAN) 50 MG tablet, Take 153m by mouth daily, Disp: , Rfl: 5 .  Bioflavonoid Products (GRAPE SEED PO), Take 200 mg by mouth daily., Disp: , Rfl:  .  Calcium Carb-Cholecalciferol (CALCIUM 1000 + D PO), Take by mouth., Disp: , Rfl:  .  cetirizine (ZYRTEC) 10 MG tablet, Take 10 mg by mouth daily., Disp: , Rfl:  .  Cholecalciferol (VITAMIN D3) 5000 units CAPS, Take 5,000 Units by mouth daily., Disp: , Rfl:  .  famotidine (PEPCID) 40 MG tablet, Take 1 tablet (40 mg total) by mouth 2 (two) times daily., Disp: 60 tablet, Rfl: 3 .  fluticasone (FLONASE) 50 MCG/ACT nasal spray, Place 2 sprays into both nostrils daily., Disp: 16 g, Rfl: 1 .  folic acid (FOLVITE) 4287MCG tablet, Take 400 mcg by mouth daily., Disp: , Rfl:  .  levothyroxine (SYNTHROID, LEVOTHROID) 100 MCG tablet, TAKE 1 TABLET AT BEDTIME, Disp: 90 tablet, Rfl: 1 .  mesalamine (LIALDA) 1.2 g EC tablet, Take 4.8 g by mouth daily with breakfast., Disp: , Rfl:  .  Misc Natural Products (TART CHERRY ADVANCED) CAPS, Take 8032mby mouth once daily, Disp: , Rfl:  .  Multiple Vitamins-Minerals (MULTIVITAMIN & MINERAL PO), Take 1 tablet by mouth daily. , Disp: , Rfl:  .  potassium chloride (K-DUR) 10 MEQ tablet, TAKE 1 TABLET(10 MEQ) BY MOUTH DAILY, Disp: 90 tablet, Rfl: 1 .  quinapril-hydrochlorothiazide (ACCURETIC) 20-12.5 MG tablet, TAKE 1 TABLET DAILY, Disp: 90 tablet, Rfl: 1 .  Red Yeast Rice Extract (RED YEAST RICE PO), Take by mouth daily., Disp: , Rfl:  .  Soft Lens Products (OPTI-FREE PUREMOIST REWETTING) SOLN, 1 drop by Does not apply route daily as needed (dry  eyes)., Disp: , Rfl:  .  traMADol (ULTRAM) 50 MG tablet, TAKE 2 TABLETS BY MOUTH THREE TIMES DAILY, Disp: 540 tablet, Rfl: 0 .  TURMERIC PO, Take 2 capsules by mouth daily. With ginger, Disp: , Rfl:    Allergies  Allergen Reactions  . Humira [Adalimumab] Shortness Of Breath    Body feels like its on fire and turns red  . Remicade [Infliximab] Shortness Of Breath    Body feels like its on fire and turns red  . Penicillins Rash    Childhood allergy PATIENT HAS HAD A PCN REACTION WITH IMMEDIATE RASH, FACIAL/TONGUE/THROAT SWELLING, SOB, OR LIGHTHEADEDNESS WITH HYPOTENSION:  #  #  #  YES  #  #  #  Has patient had a PCN reaction causing severe rash involving mucus membranes or skin necrosis: No Has patient had a PCN reaction that required hospitalization: No Has patient had a PCN reaction occurring within the last 10 years: No If all of the above answers are "NO", then may proceed with Cephalosporin use.   . Baclofen Other (See Comments)    confusion  . Statins     Pt. Refuses to take statins.  . Erythromycin Rash  . Latex Rash    Skin redness    Review of Systems   Pertinent items are noted in the HPI. Otherwise, a complete ROS is negative.  Vitals:   Vitals:   06/17/18 0938  BP: (!) 144/88  Pulse: 86  Temp: 98.6 F (37 C)  TempSrc: Oral  SpO2: 98%  Weight: 142 lb 12.8 oz (64.8 kg)  Height: 5' (1.524 m)     Body mass index is 27.89 kg/m.  Physical Exam:   Physical Exam Vitals signs and nursing note reviewed.  HENT:     Head: Normocephalic and atraumatic.     Nose: Mucosal edema and rhinorrhea present.     Right Sinus: Maxillary sinus tenderness and frontal sinus tenderness present.     Left Sinus: Maxillary sinus tenderness and frontal sinus tenderness present.  Eyes:     Pupils: Pupils are equal, round, and reactive to light.  Neck:     Musculoskeletal: Normal range of motion and neck supple.  Cardiovascular:     Rate and Rhythm: Normal rate and regular  rhythm.     Heart sounds: Normal heart sounds.  Pulmonary:     Effort: Pulmonary effort is normal.  Abdominal:     Palpations: Abdomen is soft.  Skin:    General: Skin is warm.  Psychiatric:        Behavior: Behavior normal.    Assessment and Plan:   Edwinna Rochette was seen today for follow-up.  Diagnoses and all orders for this visit:  Mixed hyperlipidemia -     Comprehensive metabolic panel -     Lipid panel -     LDL cholesterol, direct  Chronic pain of both knees Comments: She is now also followed by Dr. Paulla Fore.  Chronic bilateral low back pain with bilateral sciatica Comments: Evaluated by SM and PMR.   Fibromyalgia Comments: Okay to continue Tramadol. Other modalities reviewed - exercise, stress reduction, Cymbalta, topical treatments. Angel Fish at [employer].  Chronic maxillary sinusitis -     montelukast (SINGULAIR) 10 MG tablet; Take 1 tablet (10 mg total) by mouth at bedtime. -     Azelastine-Fluticasone (DYMISTA) 137-50 MCG/ACT SUSP; Place 1 spray into the nose 2 (two) times daily. -     cefdinir (OMNICEF) 300 MG capsule; Take 1 capsule (300 mg total) by mouth 2 (two) times daily. -     fluconazole (DIFLUCAN) 150 MG tablet; Take 1 tablet (150 mg total) by mouth once for 1 dose.  Essential hypertension -     quinapril-hydrochlorothiazide (ACCURETIC) 20-12.5 MG tablet; Take 1 tablet by mouth daily. -     potassium chloride (K-DUR) 10 MEQ tablet; Take 1 tablet (10 mEq total) by mouth daily. -     Comprehensive metabolic panel  Hypothyroidism, postradioiodine therapy -     levothyroxine (SYNTHROID, LEVOTHROID) 100 MCG tablet; Take 1 tablet (100 mcg total) by mouth at bedtime. -     T4, free -     TSH    . Orders and follow up as  documented in [place], reviewed diet, exercise and weight control, cardiovascular risk and specific lipid/LDL goals reviewed, reviewed medications and side effects in detail.  . Reviewed expectations re: course of current medical  issues. . Outlined signs and symptoms indicating need for more acute intervention. . Patient verbalized understanding and all questions were answered. . Patient received an After Visit Summary.  CMA served as Education administrator during this visit. History, Physical, and Plan performed by medical provider. The above documentation has been reviewed and is accurate and complete. Briscoe Deutscher, D.O.  Briscoe Deutscher, DO [place], [facility] 06/18/2018

## 2018-06-17 ENCOUNTER — Ambulatory Visit: Payer: Managed Care, Other (non HMO) | Admitting: Family Medicine

## 2018-06-17 ENCOUNTER — Encounter: Payer: Self-pay | Admitting: Family Medicine

## 2018-06-17 VITALS — BP 144/88 | HR 86 | Temp 98.6°F | Ht 60.0 in | Wt 142.8 lb

## 2018-06-17 DIAGNOSIS — E782 Mixed hyperlipidemia: Secondary | ICD-10-CM

## 2018-06-17 DIAGNOSIS — G8929 Other chronic pain: Secondary | ICD-10-CM

## 2018-06-17 DIAGNOSIS — I10 Essential (primary) hypertension: Secondary | ICD-10-CM | POA: Diagnosis not present

## 2018-06-17 DIAGNOSIS — M5442 Lumbago with sciatica, left side: Secondary | ICD-10-CM

## 2018-06-17 DIAGNOSIS — M797 Fibromyalgia: Secondary | ICD-10-CM

## 2018-06-17 DIAGNOSIS — E890 Postprocedural hypothyroidism: Secondary | ICD-10-CM | POA: Diagnosis not present

## 2018-06-17 DIAGNOSIS — J320 Chronic maxillary sinusitis: Secondary | ICD-10-CM

## 2018-06-17 DIAGNOSIS — M5441 Lumbago with sciatica, right side: Secondary | ICD-10-CM

## 2018-06-17 DIAGNOSIS — M25562 Pain in left knee: Secondary | ICD-10-CM

## 2018-06-17 DIAGNOSIS — M25561 Pain in right knee: Secondary | ICD-10-CM

## 2018-06-17 LAB — COMPREHENSIVE METABOLIC PANEL
ALT: 19 U/L (ref 0–35)
AST: 18 U/L (ref 0–37)
Albumin: 4.3 g/dL (ref 3.5–5.2)
Alkaline Phosphatase: 84 U/L (ref 39–117)
BUN: 18 mg/dL (ref 6–23)
CO2: 32 mEq/L (ref 19–32)
Calcium: 9.8 mg/dL (ref 8.4–10.5)
Chloride: 99 mEq/L (ref 96–112)
Creatinine, Ser: 0.65 mg/dL (ref 0.40–1.20)
GFR: 96.2 mL/min (ref >60.00)
Glucose, Bld: 81 mg/dL (ref 70–99)
Potassium: 3.6 mEq/L (ref 3.5–5.1)
Sodium: 140 mEq/L (ref 135–145)
Total Bilirubin: 0.3 mg/dL (ref 0.2–1.2)
Total Protein: 6.9 g/dL (ref 6.0–8.3)

## 2018-06-17 LAB — LIPID PANEL
Cholesterol: 238 mg/dL — ABNORMAL HIGH (ref 0–200)
HDL: 48.3 mg/dL (ref >39.00)
NonHDL: 189.96
Total CHOL/HDL Ratio: 5
Triglycerides: 277 mg/dL — ABNORMAL HIGH (ref 0.0–149.0)
VLDL: 55.4 mg/dL — ABNORMAL HIGH (ref 0.0–40.0)

## 2018-06-17 LAB — T4, FREE: Free T4: 1.22 ng/dL (ref 0.60–1.60)

## 2018-06-17 LAB — TSH: TSH: 0.77 u[IU]/mL (ref 0.35–4.50)

## 2018-06-17 LAB — LDL CHOLESTEROL, DIRECT: Direct LDL: 145 mg/dL

## 2018-06-17 MED ORDER — POTASSIUM CHLORIDE ER 10 MEQ PO TBCR: 10.0000 meq | EXTENDED_RELEASE_TABLET | Freq: Every day | ORAL | 1 refills | Status: DC

## 2018-06-17 MED ORDER — AZELASTINE-FLUTICASONE 137-50 MCG/ACT NA SUSP: 1.0000 | Freq: Two times a day (BID) | NASAL | 3 refills | Status: AC

## 2018-06-17 MED ORDER — QUINAPRIL-HYDROCHLOROTHIAZIDE 20-12.5 MG PO TABS: 1.0000 | ORAL_TABLET | Freq: Every day | ORAL | 1 refills | Status: DC

## 2018-06-17 MED ORDER — FLUCONAZOLE 150 MG PO TABS: 150.0000 mg | ORAL_TABLET | Freq: Once | ORAL | 0 refills | Status: AC

## 2018-06-17 MED ORDER — CEFDINIR 300 MG PO CAPS: 300.0000 mg | ORAL_CAPSULE | Freq: Two times a day (BID) | ORAL | 0 refills | Status: DC

## 2018-06-17 MED ORDER — LEVOTHYROXINE SODIUM 100 MCG PO TABS: 100.0000 ug | ORAL_TABLET | Freq: Every day | ORAL | 1 refills | Status: DC

## 2018-06-17 MED ORDER — MONTELUKAST SODIUM 10 MG PO TABS: 10.0000 mg | ORAL_TABLET | Freq: Every day | ORAL | 3 refills | Status: DC

## 2018-06-18 ENCOUNTER — Encounter: Payer: Self-pay | Admitting: Family Medicine

## 2018-06-23 ENCOUNTER — Other Ambulatory Visit: Payer: Self-pay | Admitting: Family Medicine

## 2018-06-23 DIAGNOSIS — I10 Essential (primary) hypertension: Secondary | ICD-10-CM

## 2018-06-27 NOTE — Progress Notes (Signed)
Office Visit Note  Patient: Erika Osborn             Date of Birth: 1950/09/27           MRN: [id number]             PCP: Briscoe Deutscher, DO Referring: Briscoe Deutscher, DO Visit Date: 07/11/2018 Occupation: _0 @  Subjective:  Pain in joints.    History of Present Illness: Jackee Glasner is a 68 y.o. female with history of ulcerative colitis, positive anti-CCP and osteoarthritis.  She states she continues to have some pain and discomfort in her joints especially her knees.  She states Dr. Paulla Fore did cortisone injections which were not effective.  She is scheduled to have Visco supplement injections by Dr. Paulla Fore.  Some stiffness in her hands.  She has off-and-on problems with the right trochanteric bursitis.  The adhesive capsulitis of the left shoulder has resolved.  She continues to have lower back pain.  She has generalized pain from fibromyalgia.  Activities of Daily Living:  Patient reports morning stiffness for 15 minutes.   Patient Reports nocturnal pain.  Difficulty dressing/grooming: Reports Difficulty climbing stairs: Reports Difficulty getting out of chair: Reports Difficulty using hands for taps, buttons, cutlery, and/or writing: Denies  Review of Systems  Constitutional: Positive for fatigue. Negative for night sweats, weight gain and weight loss.  HENT: Positive for mouth dryness. Negative for mouth sores, trouble swallowing, trouble swallowing and nose dryness.   Eyes: Negative for pain, redness, visual disturbance and dryness.  Respiratory: Negative for cough, shortness of breath and difficulty breathing.   Cardiovascular: Negative for chest pain, palpitations, hypertension, irregular heartbeat and swelling in legs/feet.  Gastrointestinal: Negative for blood in stool, constipation and diarrhea.  Endocrine: Negative for increased urination.  Genitourinary: Negative for vaginal dryness.  Musculoskeletal: Positive for arthralgias, joint pain, myalgias, morning  stiffness and myalgias. Negative for joint swelling, muscle weakness and muscle tenderness.  Skin: Negative for color change, rash, hair loss, skin tightness, ulcers and sensitivity to sunlight.  Allergic/Immunologic: Negative for susceptible to infections.  Neurological: Negative for dizziness, memory loss, night sweats and weakness.  Hematological: Negative for swollen glands.  Psychiatric/Behavioral: Positive for sleep disturbance. Negative for depressed mood. The patient is not nervous/anxious.     PMFS History:  Patient Active Problem List   Diagnosis Date Noted  . 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 Small vessel disease, cerebrovascular 11/30/2017  . 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 Anemia 08/29/2017  . Elevated MCV 03/28/2017  . Elevated LFTs 03/28/2017  . Primary osteoarthritis of both hands 01/28/2017  . ANA positive 01/26/2017  . Positive anti-CCP test 11/30/2016  . Chronic bilateral low back pain with bilateral sciatica 11/16/2016  . Osteoarthritis of spine with radiculopathy, lumbar region 11/16/2016  . Polyarthralgia 11/16/2016  . Primary osteoarthritis of both knees 11/16/2016  . Onychomycosis 11/22/2015  . Overweight (BMI 25.0-29.9) 06/25/2015  . 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 Allergic conjunctivitis and rhinitis 01/17/2014  . 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 Fibromyalgia 07/19/2013  . HTN (hypertension) 07/19/2013  . HLD (hyperlipidemia) 07/19/2013  . Ulcerative colitis ([place]) 07/19/2013    Past Medical History:  Diagnosis Date  . Arthritis   . Chronic back pain   . Complication of anesthesia    increase blood pressure for not taking meds  . Depression   . Fibromyalgia   . GERD (gastroesophageal reflux disease)   . Headache   . HLD (hyperlipidemia)   . HTN (hypertension)   .  Hypothyroidism following radioiodine therapy   . Mass of pineal region   . Pneumonia    h/o pneumonia yrs. ago  . Ulcerative colitis ([place]) 1976    Family  History  Problem Relation Age of Onset  . Cancer Father   . Heart failure Mother   . Hyperlipidemia Mother   . Osteoporosis Mother   . Hyperlipidemia Brother   . Hyperlipidemia Sister    Past Surgical History:  Procedure Laterality Date  . APPENDECTOMY    . BREAST REDUCTION SURGERY    . BREAST REDUCTION SURGERY Bilateral 06/22/2005  . COLONOSCOPY W/ BIOPSIES AND POLYPECTOMY    . LUMBAR DISC SURGERY    . RADIOLOGY WITH ANESTHESIA N/A 03/30/2017   Procedure: RADIOLOGY WITH ANESTHESIA-LUMBAR SPINE WITHOUT CONTRAST;  Surgeon: Radiologist, Medication, MD;  Location: [facility];  Service: Radiology;  Laterality: N/A;  . REDUCTION MAMMAPLASTY    . TONSILLECTOMY    . UTERINE SUSPENSION    . WISDOM TOOTH EXTRACTION     Social History   Social History Narrative   Lives at home with husband and daughters.   Right-handed.   2.5 cups caffeine per day.    Objective: Vital Signs: BP (!) 145/82 (BP Location: Left Arm, Patient Position: Sitting, Cuff Size: Normal)   Pulse 77   Resp 13   Ht 5' (1.524 m)   Wt 144 lb 12.8 oz (65.7 kg)   BMI 28.28 kg/m    Physical Exam Vitals signs and nursing note reviewed.  Constitutional:      Appearance: She is well-developed.  HENT:     Head: Normocephalic and atraumatic.  Eyes:     Conjunctiva/sclera: Conjunctivae normal.  Neck:     Musculoskeletal: Normal range of motion.  Cardiovascular:     Rate and Rhythm: Normal rate and regular rhythm.     Heart sounds: Normal heart sounds.  Pulmonary:     Effort: Pulmonary effort is normal.     Breath sounds: Normal breath sounds.  Abdominal:     General: Bowel sounds are normal.     Palpations: Abdomen is soft.  Lymphadenopathy:     Cervical: No cervical adenopathy.  Skin:    General: Skin is warm and dry.     Capillary Refill: Capillary refill takes less than 2 seconds.  Neurological:     Mental Status: She is alert and oriented to person, place, and time.  Psychiatric:        Behavior: Behavior  normal.      Musculoskeletal Exam: C-spine good range of motion.  She has limited range of motion of her lumbar spine.  Shoulder joints elbow joints wrist joints with good range of motion.  She has DIP and PIP thickening with no synovitis.  She is crepitus in her bilateral knee joints without any warmth swelling or effusion.  The trochanteric bursitis on the right hip with discharge is mildly tender.  She is in generalized pain from fibromyalgia.  CDAI Exam: CDAI Score: Not documented Patient Global Assessment: Not documented; Provider Global Assessment: Not documented Swollen: Not documented; Tender: Not documented Joint Exam   Not documented   There is currently no information documented on the homunculus. Go to the Rheumatology activity and complete the homunculus joint exam.  Investigation: No additional findings.  Imaging: No results found.  Recent Labs: Lab Results  Component Value Date   WBC 4.9 08/25/2017   HGB 13.1 08/25/2017   PLT 339.0 08/25/2017   NA 140 06/17/2018   K 3.6 06/17/2018  CL 99 06/17/2018   CO2 32 06/17/2018   GLUCOSE 81 06/17/2018   BUN 18 06/17/2018   CREATININE 0.65 06/17/2018   BILITOT 0.3 06/17/2018   ALKPHOS 84 06/17/2018   AST 18 06/17/2018   ALT 19 06/17/2018   PROT 6.9 06/17/2018   ALBUMIN 4.3 06/17/2018   CALCIUM 9.8 06/17/2018   GFRAA >60 08/20/2017    Speciality Comments: No specialty comments available.  Procedures:  No procedures performed Allergies: Humira [adalimumab]; Remicade [infliximab]; Penicillins; Baclofen; Statins; Erythromycin; and Latex   Assessment / Plan:     Visit Diagnoses: Other ulcerative colitis without complication (HCC)-patient states that her UC symptoms are well controlled on Imuran.  Imuran is prescribed by Dr. Watt Climes.  High risk medication use - On Imuran 100 mg by mouth daily by Dr. Daisey Must.(Remicade-inadequate response, Humira-shortness of breath).  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 Anti-CCP antibody can be seen with ulcerative colitis.  Patient has no synovitis on examination today.  Primary osteoarthritis of both hands-she has DIP and PIP thickening with no synovitis. Joint protection muscle strengthening was discussed.  Primary osteoarthritis of both knees-she had an adequate response to cortisone injections.  She will be getting Visco supplement injections per Dr. Paulla Fore.  Trochanteric bursitis of right hip-IT band exercises were discussed and demonstrated.  Adhesive capsulitis of left shoulder-she had good range of motion today on examination.  DDD (degenerative disc disease), lumbar - Status post fusion.  She has limited range of motion of her lumbar spine.  History of fibromyalgia-she continues to have some generalized pain and discomfort.  Need for regular exercise was discussed.  History of hyperlipidemia  History of hypothyroidism - postradioiodine therapy   History of hypertension -her blood pressure is better controlled but is still mildly elevated.  Have advised her to monitor it and follow-up with PCP.  Orders: No orders of the defined types were placed in this encounter.  No orders of the defined types were placed in this encounter.    Follow-Up Instructions: Return in about 1 year (around 07/12/2019) for [place].   Bo Merino, MD  Note - This record has been created using Editor, commissioning.  Chart creation errors have been sought, but may not always  have been located. Such creation errors do not reflect on  the standard of medical care.

## 2018-06-28 ENCOUNTER — Telehealth: Payer: Self-pay

## 2018-06-28 ENCOUNTER — Telehealth: Payer: Self-pay | Admitting: Family Medicine

## 2018-06-28 NOTE — Telephone Encounter (Signed)
Benefits verification has been initiated.    Carole Binning B routed conversation to [place] 16 minutes ago (1:43 PM)    Carole Binning B 16 minutes ago (1:43 PM)      See note       Documentation     Carole Binning B 17 minutes ago (1:42 PM)      Copied from [place] [id number]. Topic: General - Other >> Jun 28, 2018  1:13 PM Valla Leaver wrote: Reason for CRM: Patient wants Dr. Paulla Fore to know she still has [place] insurance and it's ok to initiate knee injections.

## 2018-06-28 NOTE — Telephone Encounter (Signed)
See note

## 2018-06-28 NOTE — Telephone Encounter (Signed)
Copied from [place] [id number]. Topic: General - Other >> Jun 28, 2018  1:13 PM Valla Leaver wrote: Reason for CRM: Patient wants Dr. Paulla Fore to know she still has [place] insurance and it's ok to initiate knee injections.

## 2018-06-28 NOTE — Telephone Encounter (Signed)
Copied from [place] [id number]. Topic: Quick Communication - Rx Refill/Question >> Jun 28, 2018  1:15 PM Waylan Rocher, Lumin L wrote: Medication: Azelastine-Fluticasone (DYMISTA) 137-50 MCG/ACT SUSP (costs [phone] which is too expensive and would like a call back to discuss alternates or PA because she [facility])  Has the patient contacted their pharmacy? Yes.   (Agent: If no, request that the patient contact the pharmacy for the refill.) (Agent: If yes, when and what did the pharmacy advise?)  Preferred Pharmacy (with phone number or street name): [facility] [place] [address] [place] [address] Phone: [phone] Fax: [phone]

## 2018-06-28 NOTE — Telephone Encounter (Addendum)
CRM has already been created regarding benefits verification for knee injection. This is two separate issues with 2 different providers and should not have been put into the same encounter.    Forwarding to [employer].

## 2018-06-28 NOTE — Telephone Encounter (Signed)
Called patient let her know we did not receive any prior auth request. That is probably her co pay for 3 months supply. She will call them and let us know.

## 2018-07-11 ENCOUNTER — Encounter: Payer: Self-pay | Admitting: Rheumatology

## 2018-07-11 ENCOUNTER — Ambulatory Visit: Payer: Managed Care, Other (non HMO) | Admitting: Rheumatology

## 2018-07-11 VITALS — BP 145/82 | HR 77 | Resp 13 | Ht 60.0 in | Wt 144.8 lb

## 2018-07-11 DIAGNOSIS — M170 Bilateral primary osteoarthritis of knee: Secondary | ICD-10-CM

## 2018-07-11 DIAGNOSIS — Z8639 Personal history of other endocrine, nutritional and metabolic disease: Secondary | ICD-10-CM

## 2018-07-11 DIAGNOSIS — M19042 Primary osteoarthritis, left hand: Secondary | ICD-10-CM

## 2018-07-11 DIAGNOSIS — Z8739 Personal history of other diseases of the musculoskeletal system and connective tissue: Secondary | ICD-10-CM

## 2018-07-11 DIAGNOSIS — M19041 Primary osteoarthritis, right hand: Secondary | ICD-10-CM | POA: Diagnosis not present

## 2018-07-11 DIAGNOSIS — Z8679 Personal history of other diseases of the circulatory system: Secondary | ICD-10-CM

## 2018-07-11 DIAGNOSIS — Z79899 Other long term (current) drug therapy: Secondary | ICD-10-CM | POA: Diagnosis not present

## 2018-07-11 DIAGNOSIS — R7681 Abnormal rheumatoid factor and anti-citrullinated protein antibody without rheumatoid arthritis: Secondary | ICD-10-CM

## 2018-07-11 DIAGNOSIS — M51369 Other intervertebral disc degeneration, lumbar region without mention of lumbar back pain or lower extremity pain: Secondary | ICD-10-CM

## 2018-07-11 DIAGNOSIS — K5180 Other ulcerative colitis without complications: Secondary | ICD-10-CM

## 2018-07-11 DIAGNOSIS — M7502 Adhesive capsulitis of left shoulder: Secondary | ICD-10-CM

## 2018-07-11 DIAGNOSIS — R768 Other specified abnormal immunological findings in serum: Secondary | ICD-10-CM

## 2018-07-11 DIAGNOSIS — R7689 Other specified abnormal immunological findings in serum: Secondary | ICD-10-CM

## 2018-07-11 DIAGNOSIS — M7061 Trochanteric bursitis, right hip: Secondary | ICD-10-CM

## 2018-07-11 DIAGNOSIS — M5136 Other intervertebral disc degeneration, lumbar region: Secondary | ICD-10-CM

## 2018-07-15 ENCOUNTER — Other Ambulatory Visit: Payer: Self-pay | Admitting: Family Medicine

## 2018-07-15 DIAGNOSIS — Z1231 Encounter for screening mammogram for malignant neoplasm of breast: Secondary | ICD-10-CM

## 2018-07-20 ENCOUNTER — Encounter: Payer: Self-pay | Admitting: Family Medicine

## 2018-07-27 ENCOUNTER — Telehealth: Payer: Self-pay | Admitting: Sports Medicine

## 2018-07-27 ENCOUNTER — Other Ambulatory Visit: Payer: Self-pay | Admitting: Physical Therapy

## 2018-07-27 MED ORDER — GABAPENTIN 300 MG PO CAPS: ORAL_CAPSULE | ORAL | 1 refills | Status: AC

## 2018-07-27 NOTE — Telephone Encounter (Signed)
Copied from [place] [phone] [id number]. Topic: Quick Communication - See Telephone Encounter >> Jul 27, 2018 11:23 AM Sheran Luz wrote: CRM for notification. See Telephone encounter for: 07/27/18.  Patient called with complaints of her sciatic nerve "acting up". Patient states that she is experiencing right side hip pain that goes down her leg to ankle. Patient specifically would like to know what Dr. Paulla Fore would recommend to ease symptom until appointment scheduled 2/13.

## 2018-07-27 NOTE — Telephone Encounter (Signed)
See note

## 2018-07-27 NOTE — Telephone Encounter (Signed)
Returned pt's call and informed her that Dr. Paulla Fore had 2 suggestions.  He said he could either send her to Dr. Ernestina Patches for an epidural steroid injection or he can prescribe her gabapentin to use until her f/u appt next week on 08/04/18.  Pt prefers to try gabapentin.  She states that she is already using heat, ice and IBU.  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

## 2018-07-28 ENCOUNTER — Encounter: Payer: Self-pay | Admitting: Sports Medicine

## 2018-08-04 ENCOUNTER — Encounter: Payer: Self-pay | Admitting: Sports Medicine

## 2018-08-04 ENCOUNTER — Ambulatory Visit: Payer: Managed Care, Other (non HMO) | Admitting: Sports Medicine

## 2018-08-04 ENCOUNTER — Ambulatory Visit: Payer: Self-pay

## 2018-08-04 VITALS — BP 150/92 | HR 86 | Ht 60.0 in | Wt 144.0 lb

## 2018-08-04 DIAGNOSIS — M5441 Lumbago with sciatica, right side: Secondary | ICD-10-CM

## 2018-08-04 DIAGNOSIS — M2550 Pain in unspecified joint: Secondary | ICD-10-CM | POA: Diagnosis not present

## 2018-08-04 DIAGNOSIS — M797 Fibromyalgia: Secondary | ICD-10-CM | POA: Diagnosis not present

## 2018-08-04 DIAGNOSIS — M5442 Lumbago with sciatica, left side: Secondary | ICD-10-CM

## 2018-08-04 DIAGNOSIS — M170 Bilateral primary osteoarthritis of knee: Secondary | ICD-10-CM

## 2018-08-04 DIAGNOSIS — G8929 Other chronic pain: Secondary | ICD-10-CM

## 2018-08-04 NOTE — Procedures (Signed)
PROCEDURE NOTE:  Ultrasound Guided: Injection: Bilateral knee Images were obtained and interpreted by myself, Teresa Coombs, DO  Images have been saved and stored to PACS system. Images obtained on: GE S7 Ultrasound machine    ULTRASOUND FINDINGS:  Moderate synovitis with small effusions bilaterally.  Tricompartmental degenerative change  DESCRIPTION OF PROCEDURE:  The patient's clinical condition is marked by substantial pain and/or significant functional disability. Other conservative therapy has not provided relief, is contraindicated, or not appropriate. There is a reasonable likelihood that injection will significantly improve the patient's pain and/or functional impairment.   After discussing the risks, benefits and expected outcomes of the injection and all questions were reviewed and answered, the patient wished to undergo the above named procedure.  Verbal consent was obtained.  The ultrasound was used to identify the target structure and adjacent neurovascular structures. The skin was then prepped in sterile fashion and the target structure was injected under direct visualization using sterile technique as below:  Bilateral injections performed under identical technique as below: PREP: Alcohol and Ethel Chloride APPROACH:superiolateral, single injection, 21g 2 in. INJECTATE: 2cc OrthoVisc ASPIRATE: None DRESSING: Band-Aid  Post procedural instructions including recommending icing and warning signs for infection were reviewed.    This procedure was well tolerated and there were no complications.   IMPRESSION: Succesful Ultrasound Guided: Injection

## 2018-08-04 NOTE — Progress Notes (Signed)
Erika Osborn. Erika Osborn, [place] at [facility] [phone]  Erika Osborn - 68 y.o. female MRN [id number]  Date of birth: 1951-04-07  Visit Date: August 05, 2018  PCP: Briscoe Deutscher, DO   Referred by: Briscoe Deutscher, DO  SUBJECTIVE:  Chief Complaint  Patient presents with  . Left Knee - Follow-up  . Right Knee - Follow-up    HPI: Patient is here for follow-up of her bilateral knee pain and right-sided back pain.  She has had worsening back and leg pain over the past several weeks.  She is interested in returning to physical therapy due to this issue.  Her bilateral knees have also continued to be problematic for her and are achy and stiff.  She is requesting Orthovisc today.  She denies any locking or giving way.  REVIEW OF SYSTEMS: No significant nighttime awakenings due to this issue. Denies fevers, chills, recent weight gain or weight loss.  No night sweats.  Pt denies any change in bowel or bladder habits, muscle weakness, numbness or falls associated with this pain.  HISTORY:  Prior history reviewed and updated per electronic medical record.  Patient Active Problem List   Diagnosis Date Noted  . 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 Small vessel disease, cerebrovascular 11/30/2017  . Mass of pineal region 08/29/2017    MRI from ER visit reviewed.  Current recommendation is for MRI with contrast.  This is already been ordered but is being held up by insurance.   . Anemia 08/29/2017    Recheck today.   . Elevated MCV 03/28/2017  . Elevated LFTs 03/28/2017  . Primary osteoarthritis of both hands 01/28/2017    Bilateral severe   . ANA positive 01/26/2017  . Positive anti-CCP test 11/30/2016  . Chronic bilateral low back pain with bilateral sciatica 11/16/2016  . Osteoarthritis of spine with radiculopathy, lumbar region 11/16/2016    Status post fusion   . Polyarthralgia 11/16/2016  . Primary osteoarthritis of both knees 11/16/2016      Bilateral severe with severe chondromalacia patella  12/30/2016 XR R knee Impression: These findings are consistent with severe osteoarthritis and  severe chondromalacia patella   12/30/2016 XR L knee Impression: These findings are consistent with severe osteoarthritis and  severe chondromalacia patella   Corticosteroid injections B knees 10/21/2016.   Orthovisc injections B knees  #1 08/04/2018.    Marland Kitchen Onychomycosis 11/22/2015  . Overweight (BMI 25.0-29.9) 06/25/2015  . 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 Allergic conjunctivitis and rhinitis 01/17/2014  . 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 Fibromyalgia 07/19/2013  . HTN (hypertension) 07/19/2013  . HLD (hyperlipidemia) 07/19/2013    Lab Results  Component Value Date   CHOL 271 (H) 08/25/2017   HDL 52.60 08/25/2017   LDLCALC 184 (H) 08/25/2017   LDLDIRECT 186.0 02/08/2017   TRIG 176.0 (H) 08/25/2017   CHOLHDL 5 08/25/2017      . Ulcerative colitis ([place]) 07/19/2013   Social History   Occupational History  . Occupation: Homemaker  Tobacco Use  . Smoking status: Never Smoker  . Smokeless tobacco: Never Used  Substance and Sexual Activity  . Alcohol use: No  . Drug use: No  . Sexual activity: Yes    Partners: Male    Comment: Married.   Social History   Social History Narrative   Lives at home with husband and daughters.   Right-handed.   2.5 cups caffeine per day.    OBJECTIVE:  VS:  HT:5' (  152.4 cm)   WT:144 lb (65.3 kg)  BMI:28.12    BP:(!) 150/92  HR:86bpm  TEMP: ( )  RESP:98 %   PHYSICAL EXAM: Adult female. No acute distress.  Alert and appropriate. Bilateral knees have a slight varus deformity generalized bossing she has full tension of her knees with pain varus and valgus stress stable.  Slight pain on joint lines and lateral joint  She has a negative straight leg raise bilaterally worse tightness however on the right.   ASSESSMENT:  1. Chronic bilateral low back pain with  bilateral sciatica   2. Primary osteoarthritis of both knees   3. Fibromyalgia   4. Polyarthralgia     PROCEDURES:    Bilateral US guided knee injections  PLAN:  Pertinent additional documentation may be included in corresponding procedure notes, imaging studies, problem based documentation and patient instructions.  No problem-specific Assessment & Plan notes found for this encounter.   Her symptoms have slightly worsened with her back and we will plan to refer her back to [facility] at [facility].  They also have the option for aqua therapy which she has benefited from in the past.  Her knee arthritis is continuing to cause problems as well and Orthovisc injections were started today.  We will have her plan to follow-up for series of 3 shots.  Referral to physical therapy placed today  Activity modifications and the importance of avoiding exacerbating activities (limiting pain to no more than a 4 / 10 during or following activity) recommended and discussed.   Discussed red flag symptoms that warrant earlier emergent evaluation and patient voices understanding.    No orders of the defined types were placed in this encounter.  Lab Orders  No laboratory test(s) ordered today    Imaging Orders     [place] MSK POCT ULTRASOUND  Referral Orders     Ambulatory referral to Physical Therapy  At follow up will plan to consider : Visco-supplementation irthovisc #2/3  Return in about 1 week (around 08/11/2018).          [name], [place] [facility] Physician

## 2018-08-04 NOTE — Patient Instructions (Signed)

## 2018-08-08 ENCOUNTER — Telehealth: Payer: Self-pay | Admitting: *Deleted

## 2018-08-08 ENCOUNTER — Other Ambulatory Visit: Payer: Self-pay | Admitting: *Deleted

## 2018-08-08 DIAGNOSIS — R930 Abnormal findings on diagnostic imaging of skull and head, not elsewhere classified: Secondary | ICD-10-CM

## 2018-08-08 NOTE — Telephone Encounter (Signed)
Last MRI brain w/o in March 2019.  Time for yearly scan.

## 2018-08-08 NOTE — Addendum Note (Signed)
Addended by: Marcial Pacas on: 08/08/2018 05:34 PM   Modules accepted: Orders

## 2018-08-08 NOTE — Telephone Encounter (Signed)
I ordered MRI brain w/wo to follow up previous abnormal MRI of the brain, 19 mm pineal cyst on MRI of the brain with and without contrast on September 18, 2017.

## 2018-08-09 ENCOUNTER — Telehealth: Payer: Self-pay | Admitting: Neurology

## 2018-08-09 NOTE — Telephone Encounter (Signed)
[employer] order sent to GI. They obtain the auth and reach out to the pt to schedule.

## 2018-08-10 ENCOUNTER — Telehealth: Payer: Self-pay | Admitting: Family Medicine

## 2018-08-10 NOTE — Telephone Encounter (Signed)
See note

## 2018-08-10 NOTE — Telephone Encounter (Signed)
Copied from [place] [id number]. Topic: Quick Communication - See Telephone Encounter >> Aug 10, 2018 11:31 AM Bea Graff, NT wrote: CRM for notification. See Telephone encounter for: 08/10/18. Pt is wanting to check and see if she is safe to take black seed oil with her other medications she is on? She states they are 1300 MG each and to take 2 daily.

## 2018-08-10 NOTE — Telephone Encounter (Signed)
Please advise 

## 2018-08-11 ENCOUNTER — Ambulatory Visit (INDEPENDENT_AMBULATORY_CARE_PROVIDER_SITE_OTHER): Payer: Managed Care, Other (non HMO) | Admitting: Sports Medicine

## 2018-08-11 ENCOUNTER — Ambulatory Visit: Payer: Self-pay

## 2018-08-11 ENCOUNTER — Encounter: Payer: Self-pay | Admitting: Sports Medicine

## 2018-08-11 VITALS — BP 136/74 | HR 81 | Ht 60.0 in | Wt 144.2 lb

## 2018-08-11 DIAGNOSIS — M25562 Pain in left knee: Secondary | ICD-10-CM | POA: Diagnosis not present

## 2018-08-11 DIAGNOSIS — G8929 Other chronic pain: Secondary | ICD-10-CM | POA: Diagnosis not present

## 2018-08-11 DIAGNOSIS — M25561 Pain in right knee: Secondary | ICD-10-CM | POA: Insufficient documentation

## 2018-08-11 DIAGNOSIS — M170 Bilateral primary osteoarthritis of knee: Secondary | ICD-10-CM | POA: Diagnosis not present

## 2018-08-11 NOTE — Telephone Encounter (Signed)
Called patient reviewed all information. Will call if any further questions.

## 2018-08-11 NOTE — Procedures (Signed)
  Erika Osborn. , [place] at [facility] [phone]  Erika Osborn - 68 y.o. female MRN [id number]  Date of birth: Aug 21, 1950  Visit Date:08/11/2018    PCP: Briscoe Deutscher, DO   Referred by: Briscoe Deutscher, DO  VISCO SUPPLEMENTATION VISIT  SUBJECTIVE:  Erika Osborn is here for a procedure only visit for repeat VISCO supplementation  Chief Complaint  Patient presents with  . Follow-up    B knee pain.  Orthovisc #2 today.  Doing PT too    Pt reports no significant changes in their general health or in their day to day symptoms for the condition(s) that are being treated today.   They deny any fevers, chills, or night sweats.   OBJECTIVE:  PHYSICAL EXAM: Please see previous exam notes for full joint and extremities No overlying skin changes appreciated.  MSK Exam: Bilateral knee osteo-arthritic bossing with Vitas that is mild on the right and moderate on the left.  Ultrasound shows mild to moderate synovitis on the right and mild to moderate synovitis on the left with a small to moderate effusion on the left with minimal effusion on the right  ASSESSMENT  1. Primary osteoarthritis of both knees   2. Chronic pain of both knees     PLAN:   Procedures:  PROCEDURE NOTE:  Ultrasound Guided: VISCO Injection: Bilateral knee, Orthovisc   DESCRIPTION OF PROCEDURE:  The patient's clinical condition is marked by substantial pain and/or significant functional disability. Other conservative therapy has not provided relief, is contraindicated, or not appropriate. There is a reasonable likelihood that injection will significantly improve the patient's pain and/or functional impairment.   After discussing the risks, benefits and expected outcomes of the injection and all questions were reviewed and answered, the patient wished to undergo the above named procedure.  Verbal consent was obtained.  The ultrasound was used to identify the target  structure and adjacent neurovascular structures. The skin was then prepped in sterile fashion and the target structure was injected under direct visualization using sterile technique as below:  Bilateral injections performed under identical technique as below: PREP: Alcohol and Ethel Chloride APPROACH:superiolateral, single injection, 21g 2 in. INJECTATE: 2cc OrthoVisc ASPIRATE: None DRESSING: Band-Aid  Post procedural instructions including recommending icing and warning signs for infection were reviewed.    This procedure was well tolerated and there were no complications.   IMPRESSION: Succesful Ultrasound Guided: Injection  Return in about 1 week (around 08/18/2018).        [name], [place] [facility] Physician

## 2018-08-11 NOTE — Patient Instructions (Signed)

## 2018-08-11 NOTE — Telephone Encounter (Signed)
The only information that I found was that it can interfere with beta blockers and coumadin - she is on neither of those. Can thin the blood. Needs to be off 2 weeks prior to any procedure or surgery. Should not take if kidney or liver issues.

## 2018-08-16 ENCOUNTER — Ambulatory Visit
Admission: RE | Admit: 2018-08-16 | Discharge: 2018-08-16 | Disposition: A | Discharge status: Home / Self Care | Payer: Managed Care, Other (non HMO) | Source: Ambulatory Visit | Attending: Family Medicine | Admitting: Family Medicine

## 2018-08-16 DIAGNOSIS — Z1231 Encounter for screening mammogram for malignant neoplasm of breast: Secondary | ICD-10-CM

## 2018-08-18 ENCOUNTER — Ambulatory Visit: Payer: Self-pay

## 2018-08-18 ENCOUNTER — Ambulatory Visit (INDEPENDENT_AMBULATORY_CARE_PROVIDER_SITE_OTHER): Payer: Managed Care, Other (non HMO) | Admitting: Sports Medicine

## 2018-08-18 ENCOUNTER — Encounter: Payer: Self-pay | Admitting: Sports Medicine

## 2018-08-18 DIAGNOSIS — M170 Bilateral primary osteoarthritis of knee: Secondary | ICD-10-CM

## 2018-08-18 NOTE — Patient Instructions (Signed)

## 2018-08-18 NOTE — Procedures (Signed)
  Erika Osborn. Erika Osborn, [place] at [facility] [phone]  Erika Osborn - 68 y.o. female MRN [id number]  Date of birth: [birth year]  Visit Date: 08/18/2018  PCP: Briscoe Deutscher, DO   Referred by: Briscoe Deutscher, DO  VISCO SUPPLEMENTATION VISIT  SUBJECTIVE:  Erika Osborn is here for a procedure only visit for repeat VISCO supplementation  Chief Complaint  Patient presents with  . Follow-up    Orthovisc #3/3   Pt reports no significant changes in their general health or in their day to day symptoms for the condition(s) that are being treated today.   They deny any fevers, chills, or night sweats.   OBJECTIVE:  PHYSICAL EXAM: Please see previous exam notes for full joint and extremities No overlying skin changes appreciated.  MSK Exam: Generalized osteoarthritic bossing.  No significant effusion.    ASSESSMENT  1. Primary osteoarthritis of both knees     PLAN:   Procedures:  PROCEDURE NOTE:  Ultrasound Guided: VISCO Injection: Bilateral knee   DESCRIPTION OF PROCEDURE:  The patient's clinical condition is marked by substantial pain and/or significant functional disability. Other conservative therapy has not provided relief, is contraindicated, or not appropriate. There is a reasonable likelihood that injection will significantly improve the patient's pain and/or functional impairment.   After discussing the risks, benefits and expected outcomes of the injection and all questions were reviewed and answered, the patient wished to undergo the above named procedure.  Verbal consent was obtained.  The ultrasound was used to identify the target structure and adjacent neurovascular structures. The skin was then prepped in sterile fashion and the target structure was injected under direct visualization using sterile technique as below:  Bilateral injections performed under identical technique as below: PREP: Alcohol and Ethel  Chloride APPROACH:superiolateral, single injection, 21g 2 in. INJECTATE: 2cc OrthoVisc ASPIRATE: None DRESSING: Band-Aid  Post procedural instructions including recommending icing and warning signs for infection were reviewed.    This procedure was well tolerated and there were no complications.   IMPRESSION: Succesful Ultrasound Guided: Injection  Return if symptoms worsen or fail to improve.        [name], [place] [facility] Physician

## 2018-08-21 NOTE — Progress Notes (Signed)
Patient is here for Orthovisc No. 3 of 3.  She additionally reports wanting to try working with integrative therapies for physical therapy.  She does have generalized deconditioning of her bilateral lower extremities as well as hip weakness and will benefit from formal therapy for this and her fibromyalgia.  Referral placed today.

## 2018-09-01 ENCOUNTER — Other Ambulatory Visit: Payer: Self-pay | Admitting: Family Medicine

## 2018-09-01 DIAGNOSIS — G8929 Other chronic pain: Secondary | ICD-10-CM

## 2018-09-01 DIAGNOSIS — M5442 Lumbago with sciatica, left side: Secondary | ICD-10-CM

## 2018-09-01 DIAGNOSIS — M25562 Pain in left knee: Principal | ICD-10-CM

## 2018-09-01 DIAGNOSIS — M5441 Lumbago with sciatica, right side: Secondary | ICD-10-CM

## 2018-09-01 DIAGNOSIS — M797 Fibromyalgia: Secondary | ICD-10-CM

## 2018-09-01 DIAGNOSIS — M25561 Pain in right knee: Principal | ICD-10-CM

## 2018-09-05 ENCOUNTER — Telehealth: Payer: Self-pay | Admitting: Neurology

## 2018-09-05 NOTE — Telephone Encounter (Signed)
Pt called wanting to know if should wait to get the MRI done that she has scheduled this Sat due to the [place] Virus situation. Please advise.

## 2018-09-05 NOTE — Telephone Encounter (Signed)
I have spoken with the patient.  She is concerned about having recent GI symptoms (diarrhea).  Additionally, she has an immunocompromised daughter at home.  She has decided to delay her yearly MRI brain scan by a few weeks.  She is going to call [place] today to reschedule.

## 2018-09-10 ENCOUNTER — Other Ambulatory Visit: Payer: Managed Care, Other (non HMO)

## 2018-09-12 ENCOUNTER — Ambulatory Visit: Payer: Managed Care, Other (non HMO) | Admitting: Family Medicine

## 2018-09-19 ENCOUNTER — Ambulatory Visit (INDEPENDENT_AMBULATORY_CARE_PROVIDER_SITE_OTHER): Payer: Managed Care, Other (non HMO) | Admitting: Physician Assistant

## 2018-09-19 ENCOUNTER — Encounter: Payer: Self-pay | Admitting: Physician Assistant

## 2018-09-19 ENCOUNTER — Other Ambulatory Visit: Payer: Self-pay

## 2018-09-19 VITALS — BP 163/94 | HR 85 | Temp 97.5°F | Ht 60.0 in | Wt 145.0 lb

## 2018-09-19 DIAGNOSIS — J329 Chronic sinusitis, unspecified: Secondary | ICD-10-CM | POA: Diagnosis not present

## 2018-09-19 MED ORDER — CEFDINIR 300 MG PO CAPS: 300.0000 mg | ORAL_CAPSULE | Freq: Two times a day (BID) | ORAL | 0 refills | Status: AC

## 2018-09-19 MED ORDER — ALBUTEROL SULFATE 108 (90 BASE) MCG/ACT IN AEPB: 1.0000 | INHALATION_SPRAY | Freq: Four times a day (QID) | RESPIRATORY_TRACT | 0 refills | Status: AC | PRN

## 2018-09-19 NOTE — Progress Notes (Signed)
Virtual Visit via Video   I connected with Erika Osborn on 09/19/18 at 11:20 AM EDT by a video enabled telemedicine application and verified that I am speaking with the correct person using two identifiers. Location patient: Home Location provider: [employer] [facility], Office Persons participating in the virtual visit: [place], [place], [place]   I discussed the limitations of evaluation and management by telemedicine and the availability of in person appointments. The patient expressed understanding and agreed to proceed.  Subjective:   HPI: Cough Pt said started about 2 weeks ago, coughing and expectorating thick green sputum. Nasal drainage is clear. No increase in joint pain or muscle aches. Pt denies fever or chills or headaches. Pt had SOB and very congested yesterday morning and started to use Dymista that she had from Dr. [name] that helped. Pt has not tried any OTC medications. Using humidifier at home. Pt denies any GI disturbances and is drinking plenty of fluids.  Denies: fever, SOB, chest pain, weakness, fatigue  She has not left her house in about 1 month and is practicing good social distancing.  ROS: See pertinent positives and negatives per HPI.  Patient Active Problem List   Diagnosis Date Noted  . Bilateral knee pain 08/11/2018  . 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 Small vessel disease, cerebrovascular 11/30/2017  . 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 Anemia 08/29/2017  . Elevated MCV 03/28/2017  . Elevated LFTs 03/28/2017  . Primary osteoarthritis of both hands 01/28/2017  . ANA positive 01/26/2017  . Positive anti-CCP test 11/30/2016  . Chronic bilateral low back pain with bilateral sciatica 11/16/2016  . Osteoarthritis of spine with radiculopathy, lumbar region 11/16/2016  . Polyarthralgia 11/16/2016  . Primary osteoarthritis of both knees 11/16/2016  . Onychomycosis 11/22/2015  . Overweight (BMI 25.0-29.9) 06/25/2015  . 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  Allergic conjunctivitis and rhinitis 01/17/2014  . 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 Fibromyalgia 07/19/2013  . HTN (hypertension) 07/19/2013  . HLD (hyperlipidemia) 07/19/2013  . Ulcerative colitis ([place]) 07/19/2013    Social History   Tobacco Use  . Smoking status: Never Smoker  . Smokeless tobacco: Never Used  Substance Use Topics  . Alcohol use: No    Current Outpatient Medications:  .  acetaminophen (TYLENOL) 650 MG CR tablet, Take 1,300 mg by mouth daily as needed for pain., Disp: , Rfl:  .  aspirin 81 MG tablet, Take 81 mg by mouth daily., Disp: , Rfl:  .  azaTHIOprine (IMURAN) 50 MG tablet, Take 139m by mouth daily, Disp: , Rfl: 5 .  Azelastine-Fluticasone (DYMISTA) 137-50 MCG/ACT SUSP, Place 1 spray into the nose 2 (two) times daily. (Patient taking differently: Place 1 spray into the nose as needed. ), Disp: 1 Bottle, Rfl: 3 .  Bioflavonoid Products (GRAPE SEED PO), Take 200 mg by mouth daily., Disp: , Rfl:  .  Calcium Carb-Cholecalciferol (CALCIUM 1000 + D PO), Take by mouth., Disp: , Rfl:  .  cetirizine (ZYRTEC) 10 MG tablet, Take 10 mg by mouth daily., Disp: , Rfl:  .  Cholecalciferol (VITAMIN D3) 5000 units CAPS, Take 5,000 Units by mouth daily., Disp: , Rfl:  .  Cyanocobalamin (VITAMIN B12 PO), Take by mouth., Disp: , Rfl:  .  famotidine (PEPCID) 40 MG/5ML suspension, Take 5 mLs (40 mg total) by mouth at bedtime., Disp: 50 mL, Rfl: 6 .  fluticasone (FLONASE) 50 MCG/ACT nasal spray, Place 2 sprays into both nostrils daily. (Patient taking differently: Place 2 sprays  into both nostrils as needed. ), Disp: 16 g, Rfl: 1 .  folic acid (FOLVITE) 309 MCG tablet, Take 400 mcg by mouth daily., Disp: , Rfl:  .  gabapentin (NEURONTIN) 300 MG capsule, Start with 1 tab po qhs X 1 week, then increase to 1 tab po bid X 1 week then 1 tab po tid prn, Disp: 90 capsule, Rfl: 1 .  levothyroxine (SYNTHROID, LEVOTHROID) 100 MCG tablet, Take 1 tablet (100 mcg total) by mouth  at bedtime., Disp: 90 tablet, Rfl: 1 .  MAGNESIUM PO, Take 1 capsule by mouth at bedtime., Disp: , Rfl:  .  mesalamine (LIALDA) 1.2 g EC tablet, Take 4.8 g by mouth daily with breakfast., Disp: , Rfl:  .  Misc Natural Products (TART CHERRY ADVANCED) CAPS, Take 844m by mouth once daily, Disp: , Rfl:  .  montelukast (SINGULAIR) 10 MG tablet, Take 1 tablet (10 mg total) by mouth at bedtime., Disp: 30 tablet, Rfl: 3 .  Multiple Vitamins-Minerals (MULTIVITAMIN & MINERAL PO), Take 1 tablet by mouth daily. , Disp: , Rfl:  .  potassium chloride (K-DUR) 10 MEQ tablet, TAKE 1 TABLET(10 MEQ) BY MOUTH DAILY, Disp: 90 tablet, Rfl: 1 .  quinapril-hydrochlorothiazide (ACCURETIC) 20-12.5 MG tablet, Take 1 tablet by mouth daily., Disp: 90 tablet, Rfl: 1 .  Red Yeast Rice Extract (RED YEAST RICE PO), Take by mouth daily., Disp: , Rfl:  .  Soft Lens Products (OPTI-FREE PUREMOIST REWETTING) SOLN, 1 drop by Does not apply route daily as needed (dry eyes)., Disp: , Rfl:  .  traMADol (ULTRAM) 50 MG tablet, TAKE 2 TABLETS BY MOUTH THREE TIMES DAILY, Disp: 540 tablet, Rfl: 0 .  TURMERIC PO, Take 2 capsules by mouth daily. With ginger, Disp: , Rfl:  .  Albuterol Sulfate 108 (90 Base) MCG/ACT AEPB, Inhale 1-2 puffs into the lungs every 6 (six) hours as needed., Disp: 1 each, Rfl: 0 .  cefdinir (OMNICEF) 300 MG capsule, Take 1 capsule (300 mg total) by mouth 2 (two) times daily., Disp: 20 capsule, Rfl: 0  Allergies  Allergen Reactions  . Humira [Adalimumab] Shortness Of Breath    Body feels like its on fire and turns red  . Remicade [Infliximab] Shortness Of Breath    Body feels like its on fire and turns red  . Penicillins Rash    Childhood allergy PATIENT HAS HAD A PCN REACTION WITH IMMEDIATE RASH, FACIAL/TONGUE/THROAT SWELLING, SOB, OR LIGHTHEADEDNESS WITH HYPOTENSION:  #  #  #  YES  #  #  #   Has patient had a PCN reaction causing severe rash involving mucus membranes or skin necrosis: No Has patient had a PCN  reaction that required hospitalization: No Has patient had a PCN reaction occurring within the last 10 years: No If all of the above answers are "NO", then may proceed with Cephalosporin use.   . Baclofen Other (See Comments)    confusion  . Statins     Pt. Refuses to take statins.  . Erythromycin Rash  . Latex Rash    Skin redness     Objective:   VITALS: Per patient if applicable, see vitals. GENERAL: Alert, appears well and in no acute distress. HEENT: Atraumatic, conjunctiva clear, no obvious abnormalities on inspection of external nose and ears. +Maxillary sinus tenderness per patient NECK: Normal movements of the head and neck. CARDIOPULMONARY: No increased WOB. Speaking in clear sentences. I:E ratio WNL.  MS: Moves all visible extremities without noticeable abnormality. PSYCH: Pleasant and cooperative,  well-groomed. Speech normal rate and rhythm. Affect is appropriate. Insight and judgement are appropriate. Attention is focused, linear, and appropriate.  NEURO: CN grossly intact. Oriented as arrived to appointment on time with no prompting. Moves both UE equally.  SKIN: No obvious lesions, wounds, erythema, or cyanosis noted on face or hands.  Assessment and Plan:    Erika Osborn was seen today for cough.  Diagnoses and all orders for this visit:  Sinusitis, unspecified chronicity, unspecified location  Other orders -     Albuterol Sulfate 108 (90 Base) MCG/ACT AEPB; Inhale 1-2 puffs into the lungs every 6 (six) hours as needed. -     cefdinir (OMNICEF) 300 MG capsule; Take 1 capsule (300 mg total) by mouth 2 (two) times daily.   No red flags on exam. Suspect sinusitis.  Will initiate omnicef per orders. Discussed taking medications as prescribed. Reviewed return precautions including worsening fever, SOB, worsening cough or other concerns. Push fluids and rest. I recommend that patient follow-up if symptoms worsen or persist despite treatment x 7-10 days, sooner if needed.    . Reviewed expectations re: course of current medical issues. . Discussed self-management of symptoms. . Outlined signs and symptoms indicating need for more acute intervention. . Patient verbalized understanding and all questions were answered. Marland Kitchen Health Maintenance issues including appropriate healthy diet, exercise, and smoking avoidance were discussed with patient. . See orders for this visit as documented in the electronic medical record.   I discussed the assessment and treatment plan with the patient. The patient was provided an opportunity to ask questions and all were answered. The patient agreed with the plan and demonstrated an understanding of the instructions.   The patient was advised to call back or seek an in-person evaluation if the symptoms worsen or if the condition fails to improve as anticipated.  CMA or LPN served as scribe during this visit. History, Physical, and Plan performed by medical provider. The above documentation has been reviewed and is accurate and complete.    [place], [place] 09/19/2018

## 2018-09-20 ENCOUNTER — Telehealth: Payer: Self-pay | Admitting: Family Medicine

## 2018-09-20 NOTE — Telephone Encounter (Signed)
Copied from [place] [id number]. Topic: Quick Communication - Rx Refill/Question >> Sep 20, 2018  3:07 PM Sheppard Coil, [employer] wrote: Medication:  Flonase  Pt called and left message on [place] states she thought that Flonase was going to be called in for her, but there is nothing at the pharmacy for her.  Pt can be reached at [phone].

## 2018-09-20 NOTE — Telephone Encounter (Signed)
Copied from [place] [id number]. Topic: Quick Communication - See Telephone Encounter >> Sep 20, 2018  3:09 PM Berneta Levins wrote: CRM for notification. See Telephone encounter for: 09/20/18.  Pt called and left message on [facility] mailbox 09/19/2018.  States she was supposed to call and report blood pressure reading.  States that it was 141/78 pulse 76. Pt can be reached at [phone].

## 2018-09-21 ENCOUNTER — Other Ambulatory Visit: Payer: Self-pay

## 2018-09-21 MED ORDER — FLUTICASONE PROPIONATE 50 MCG/ACT NA SUSP: 2.0000 | Freq: Every day | NASAL | 6 refills | Status: AC

## 2018-09-21 NOTE — Telephone Encounter (Signed)
Yes! Thank you. No further action is needed.

## 2018-09-21 NOTE — Telephone Encounter (Signed)
Should this have gone to you?

## 2018-09-21 NOTE — Telephone Encounter (Signed)
meds sent my chart sent to let patient know.

## 2018-10-05 ENCOUNTER — Other Ambulatory Visit: Payer: Self-pay | Admitting: Family Medicine

## 2018-10-05 DIAGNOSIS — J320 Chronic maxillary sinusitis: Secondary | ICD-10-CM

## 2018-10-06 NOTE — Telephone Encounter (Signed)
Last OV 09/19/2018 Last refill 06/17/2018 #30/3 Next OV not scheduled

## 2018-10-07 ENCOUNTER — Other Ambulatory Visit: Payer: Self-pay

## 2018-10-07 ENCOUNTER — Other Ambulatory Visit: Payer: Self-pay | Admitting: Family Medicine

## 2018-10-07 DIAGNOSIS — R0981 Nasal congestion: Secondary | ICD-10-CM

## 2018-10-07 DIAGNOSIS — M5442 Lumbago with sciatica, left side: Secondary | ICD-10-CM

## 2018-10-07 DIAGNOSIS — M25561 Pain in right knee: Principal | ICD-10-CM

## 2018-10-07 DIAGNOSIS — M797 Fibromyalgia: Secondary | ICD-10-CM

## 2018-10-07 DIAGNOSIS — G8929 Other chronic pain: Secondary | ICD-10-CM

## 2018-10-07 DIAGNOSIS — M5441 Lumbago with sciatica, right side: Secondary | ICD-10-CM

## 2018-10-07 DIAGNOSIS — M25562 Pain in left knee: Principal | ICD-10-CM

## 2018-10-07 MED ORDER — TRAMADOL HCL 50 MG PO TABS: 100.0000 mg | ORAL_TABLET | Freq: Three times a day (TID) | ORAL | 0 refills | Status: DC

## 2018-10-07 MED ORDER — FAMOTIDINE 40 MG PO TABS: 40.0000 mg | ORAL_TABLET | Freq: Every day | ORAL | 0 refills | Status: DC

## 2018-10-07 MED ORDER — FLUTICASONE PROPIONATE 50 MCG/ACT NA SUSP: 2.0000 | Freq: Every day | NASAL | 1 refills | Status: AC

## 2018-10-07 NOTE — Telephone Encounter (Signed)
Please send refill in

## 2018-10-24 ENCOUNTER — Other Ambulatory Visit: Payer: Self-pay

## 2018-10-24 DIAGNOSIS — M25562 Pain in left knee: Principal | ICD-10-CM

## 2018-10-24 DIAGNOSIS — M5441 Lumbago with sciatica, right side: Secondary | ICD-10-CM

## 2018-10-24 DIAGNOSIS — M25561 Pain in right knee: Principal | ICD-10-CM

## 2018-10-24 DIAGNOSIS — M797 Fibromyalgia: Secondary | ICD-10-CM

## 2018-10-24 DIAGNOSIS — M5442 Lumbago with sciatica, left side: Secondary | ICD-10-CM

## 2018-10-24 DIAGNOSIS — G8929 Other chronic pain: Secondary | ICD-10-CM

## 2018-10-24 NOTE — Telephone Encounter (Signed)
Last med check 06/17/2018 Last refill 10/07/2018 #540/0 to [facility], pt requesting rx be sent to [facility] Next OV not scheduled  Forwarding to Dr. [name]

## 2018-10-24 NOTE — Telephone Encounter (Signed)
Pt is requesting rx for Tramadol to be sent to [facility] for 90 day supply.

## 2018-10-25 MED ORDER — TRAMADOL HCL 50 MG PO TABS: 100.0000 mg | ORAL_TABLET | Freq: Three times a day (TID) | ORAL | 0 refills | Status: DC

## 2018-11-02 ENCOUNTER — Telehealth: Payer: Self-pay | Admitting: *Deleted

## 2018-11-02 NOTE — Telephone Encounter (Signed)
Spoke w/ pt. Daughter coming on 11/16/18 and she would like appt on same day. I rescheduled her appt to 11/16/18 at 930am. cx appt 11/2018. She is unable to get MRI right now d/t covid-19. She will come for yearly f/u. Husband moving to [facility] 11/21/18 and she wanted to come before this occurred and his insurance changed.

## 2018-11-16 ENCOUNTER — Ambulatory Visit (INDEPENDENT_AMBULATORY_CARE_PROVIDER_SITE_OTHER): Payer: Managed Care, Other (non HMO) | Admitting: Neurology

## 2018-11-16 ENCOUNTER — Encounter: Payer: Self-pay | Admitting: Neurology

## 2018-11-16 ENCOUNTER — Other Ambulatory Visit: Payer: Self-pay

## 2018-11-16 VITALS — BP 163/91 | HR 84 | Temp 97.3°F | Ht 60.0 in | Wt 144.0 lb

## 2018-11-16 DIAGNOSIS — I679 Cerebrovascular disease, unspecified: Secondary | ICD-10-CM | POA: Diagnosis not present

## 2018-11-16 DIAGNOSIS — G4700 Insomnia, unspecified: Secondary | ICD-10-CM | POA: Diagnosis not present

## 2018-11-16 DIAGNOSIS — E348 Other specified endocrine disorders: Secondary | ICD-10-CM

## 2018-11-16 MED ORDER — CLONAZEPAM 1 MG PO TABS: 1.0000 mg | ORAL_TABLET | Freq: Every evening | ORAL | 0 refills | Status: AC | PRN

## 2018-11-16 NOTE — Patient Instructions (Signed)
[facility]  [email or web address]

## 2018-11-16 NOTE — Progress Notes (Signed)
PATIENT: Erika Osborn DOB: 1950/07/19  Chief Complaint  Patient presents with  . Small Vessel Disease    She is here with her husband, Eulas Post, for her yearly follow up.       HISTORICAL  Erika Osborn is a 68 year old female, seen in refer by her primary care doctor Briscoe Deutscher for evaluation of abnormal MRI of brain, initial evaluation was on November 30, 2017.  She has past medical history of hypertension, hyperlipidemia, ulcerative colitis, is taking Imuran 50 mg 2 tablets every night, also has a history of scoliosis, chronic low back pain, is under the care of orthopedic surgeon Dr. Ernestina Patches, recent improvement of her low back pain after epidural injection.  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  She does complains of fatigue, blurry vision, gait difficulty due to her pain,  UPDATE Nov 16 2018: She did not have MRI due to Covid concern, continue complains of low back pain, she is taking aspirin 81 mg daily, taking gabapentin 300 mg every night for sleep, but complains of excessive sleepiness, family is moving to [facility] soon.  Ultrasound of carotid arteries showed less than 30% stenosis bilaterally, echocardiogram showed no significant abnormality.  REVIEW OF SYSTEMS: Full 14 system review of systems  performed and notable only for as above  ALLERGIES: Allergies  Allergen Reactions  . Humira [Adalimumab] Shortness Of Breath    Body feels like its on fire and turns red  . Remicade [Infliximab] Shortness Of Breath    Body feels like its on fire and turns red  . Penicillins Rash    Childhood allergy PATIENT HAS HAD A PCN REACTION WITH IMMEDIATE RASH, FACIAL/TONGUE/THROAT SWELLING, SOB, OR LIGHTHEADEDNESS WITH HYPOTENSION:  #  #  #  YES  #  #  #   Has patient had a PCN reaction causing severe rash involving mucus membranes or skin necrosis: No Has patient had a PCN reaction that required hospitalization: No Has patient had a PCN reaction occurring within the last 10 years: No If all of the above answers are "NO", then may proceed with Cephalosporin use.   . Baclofen Other (See Comments)    confusion  . Statins     Pt. Refuses to take statins.  . Erythromycin Rash  . Latex Rash    Skin redness     HOME MEDICATIONS: Current Outpatient Medications  Medication Sig Dispense Refill  . acetaminophen (TYLENOL) 650 MG CR tablet Take 1,300 mg by mouth daily as needed for pain.    . Albuterol Sulfate 108 (90 Base) MCG/ACT AEPB Inhale 1-2 puffs into the lungs every 6 (six) hours as needed. 1 each 0  . aspirin 81 MG tablet Take 81 mg by mouth daily.    Marland Kitchen azaTHIOprine (IMURAN) 50 MG tablet Take 163m by mouth daily  5  . Azelastine-Fluticasone (  DYMISTA) 137-50 MCG/ACT SUSP Place 1 spray into the nose 2 (two) times daily. (Patient taking differently: Place 1 spray into the nose as needed. ) 1 Bottle 3  . Bioflavonoid Products (GRAPE SEED PO) Take 200 mg by mouth daily.    . Calcium Carb-Cholecalciferol (CALCIUM 1000 + D PO) Take by mouth.    . cefdinir (OMNICEF) 300 MG capsule Take 1 capsule (300 mg total) by mouth 2 (two) times daily. 20 capsule 0  . cetirizine (ZYRTEC) 10 MG tablet Take 10 mg by mouth daily.    . Cholecalciferol (VITAMIN D3) 5000 units CAPS Take 5,000 Units by mouth daily.     . Cyanocobalamin (VITAMIN B12 PO) Take by mouth.    . famotidine (PEPCID) 40 MG tablet Take 1 tablet (40 mg total) by mouth daily. 90 tablet 0  . famotidine (PEPCID) 40 MG/5ML suspension Take 5 mLs (40 mg total) by mouth at bedtime. 50 mL 6  . fluticasone (FLONASE) 50 MCG/ACT nasal spray Place 2 sprays into both nostrils daily. 16 g 6  . fluticasone (FLONASE) 50 MCG/ACT nasal spray Place 2 sprays into both nostrils daily. 16 g 1  . folic acid (FOLVITE) 902 MCG tablet Take 400 mcg by mouth daily.    Marland Kitchen gabapentin (NEURONTIN) 300 MG capsule Start with 1 tab po qhs X 1 week, then increase to 1 tab po bid X 1 week then 1 tab po tid prn 90 capsule 1  . levothyroxine (SYNTHROID, LEVOTHROID) 100 MCG tablet Take 1 tablet (100 mcg total) by mouth at bedtime. 90 tablet 1  . MAGNESIUM PO Take 1 capsule by mouth at bedtime.    . mesalamine (LIALDA) 1.2 g EC tablet Take 4.8 g by mouth daily with breakfast.    . Misc Natural Products (TART CHERRY ADVANCED) CAPS Take 830m by mouth once daily    . montelukast (SINGULAIR) 10 MG tablet TAKE 1 TABLET AT BEDTIME 90 tablet 0  . Multiple Vitamins-Minerals (MULTIVITAMIN & MINERAL PO) Take 1 tablet by mouth daily.     . potassium chloride (K-DUR) 10 MEQ tablet TAKE 1 TABLET(10 MEQ) BY MOUTH DAILY 90 tablet 1  . quinapril-hydrochlorothiazide (ACCURETIC) 20-12.5 MG tablet Take 1 tablet by mouth daily. 90 tablet 1  . Red Yeast Rice Extract (RED YEAST RICE PO) Take by mouth daily.    . Soft Lens Products (OPTI-FREE PUREMOIST REWETTING) SOLN 1 drop by Does not apply route daily as needed (dry eyes).    . traMADol (ULTRAM) 50 MG tablet Take 2 tablets (100 mg total) by mouth 3 (three) times daily. 540 tablet 0  . TURMERIC PO Take 2 capsules by mouth daily. With ginger     No current facility-administered medications for this visit.     PAST MEDICAL HISTORY: Past Medical History:  Diagnosis Date  . Arthritis   . Chronic back pain   . Complication of anesthesia     increase blood pressure for not taking meds  . Depression   . Fibromyalgia   . GERD (gastroesophageal reflux disease)   . Headache   . HLD (hyperlipidemia)   . HTN (hypertension)   . Hypothyroidism following radioiodine therapy   . Mass of pineal region   . Pneumonia    h/o pneumonia yrs. ago  . Ulcerative colitis ([place] 1976    PAST SURGICAL HISTORY: Past Surgical History:  Procedure Laterality Date  . APPENDECTOMY    . BREAST REDUCTION SURGERY    . BREAST REDUCTION SURGERY Bilateral 06/22/2005  .  COLONOSCOPY W/ BIOPSIES AND POLYPECTOMY    . LUMBAR DISC SURGERY    . RADIOLOGY WITH ANESTHESIA N/A 03/30/2017   Procedure: RADIOLOGY WITH ANESTHESIA-LUMBAR SPINE WITHOUT CONTRAST;  Surgeon: Radiologist, Medication, MD;  Location: [facility];  Service: Radiology;  Laterality: N/A;  . REDUCTION MAMMAPLASTY    . TONSILLECTOMY    . UTERINE SUSPENSION    . WISDOM TOOTH EXTRACTION      FAMILY HISTORY: Family History  Problem Relation Age of Onset  . Cancer Father   . Heart failure Mother   . Hyperlipidemia Mother   . Osteoporosis Mother   . Hyperlipidemia Brother   . Hyperlipidemia Sister     SOCIAL HISTORY:  Social History   Socioeconomic History  . Marital status: Married    Spouse name: Not on file  . Number of children: 2  . Years of education: 23  . Highest education level: High school graduate  Occupational History  . Occupation: Agricultural engineer  Social Needs  . Financial resource strain: Not on file  . Food insecurity:    Worry: Not on file    Inability: Not on file  . Transportation needs:    Medical: Not on file    Non-medical: Not on file  Tobacco Use  . Smoking status: Never Smoker  . Smokeless tobacco: Never Used  Substance and Sexual Activity  . Alcohol use: No  . Drug use: No  . Sexual activity: Yes    Partners: Male    Comment: Married.  Lifestyle  . Physical activity:    Days per week: Not on file    Minutes per session: Not on file  . Stress: Not on  file  Relationships  . Social connections:    Talks on phone: Not on file    Gets together: Not on file    Attends religious service: Not on file    Active member of club or organization: Not on file    Attends meetings of clubs or organizations: Not on file    Relationship status: Not on file  . Intimate partner violence:    Fear of current or ex partner: Not on file    Emotionally abused: Not on file    Physically abused: Not on file    Forced sexual activity: Not on file  Other Topics Concern  . Not on file  Social History Narrative   Lives at home with husband and daughters.   Right-handed.   2.5 cups caffeine per day.     PHYSICAL EXAM   Vitals:   11/16/18 0929  BP: (!) 163/91  Pulse: 84  Temp: (!) 97.3 F (36.3 C)  Weight: 144 lb (65.3 kg)  Height: 5' (1.524 m)    Not recorded      Body mass index is 28.12 kg/m.  PHYSICAL EXAMNIATION:  Gen: NAD, conversant, well nourised, obese, well groomed                     Cardiovascular: Regular rate rhythm, no peripheral edema, warm, nontender. Eyes: Conjunctivae clear without exudates or hemorrhage Neck: Supple, no carotid bruits. Pulmonary: Clear to auscultation bilaterally   NEUROLOGICAL EXAM:  MENTAL STATUS: Speech:    Speech is normal; fluent and spontaneous with normal comprehension.  Cognition:     Orientation to time, place and person     Normal recent and remote memory     Normal Attention span and concentration     Normal Language, naming, repeating,spontaneous speech  Fund of knowledge   CRANIAL NERVES: CN II: Visual fields are full to confrontation.  Pupils are round equal and briskly reactive to light. CN III, IV, VI: extraocular movement are normal. No ptosis. CN V: Facial sensation is intact to pinprick in all 3 divisions bilaterally. Corneal responses are intact.  CN VII: Face is symmetric with normal eye closure and smile. CN VIII: Hearing is normal to rubbing fingers CN IX, X: Palate  elevates symmetrically. Phonation is normal. CN XI: Head turning and shoulder shrug are intact CN XII: Tongue is midline with normal movements and no atrophy.  MOTOR: There is no pronator drift of out-stretched arms. Muscle bulk and tone are normal. Muscle strength is normal.    COORDINATION:  There is no dysmetria on finger-to-nose and heel-knee-shin.    GAIT/STANCE: Need to push up to get up from sitting to position, mildly antalgic,   DIAGNOSTIC DATA (LABS, IMAGING, TESTING) - I reviewed patient records, labs, notes, testing and imaging myself where available.   ASSESSMENT AND PLAN  Erika Osborn is a 68 y.o. 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 Ph.D.  [facility] [address], [place], [place] [address] Ph: [phone] Fax: [phone]  CC: Briscoe Deutscher, DO

## 2018-12-01 ENCOUNTER — Telehealth: Payer: Self-pay | Admitting: Neurology

## 2018-12-01 NOTE — Telephone Encounter (Signed)
Pt states the medication that was prescribed to her last is not helping. It's making her drag very bad. Please advise.

## 2018-12-01 NOTE — Telephone Encounter (Addendum)
After taking clonazepam 82m at bedtime, she feels groggy the following day until noon.  However, it does help her sleep.  Recommended she cut the tablet in half to see if it still helps her sleep but does not cause too much drowsiness the following morning.   She will call back if the lower dose still causes her problems.

## 2018-12-06 ENCOUNTER — Ambulatory Visit: Payer: Managed Care, Other (non HMO) | Admitting: Neurology

## 2018-12-13 ENCOUNTER — Telehealth: Payer: Self-pay | Admitting: Family Medicine

## 2018-12-13 NOTE — Telephone Encounter (Signed)
Pt called.  States that they are moving to [place] next weekend.  She states that her spouse got a job there this month and this is a surprise for them.  Pt wants to make sure that Dr. [name] will be able to fill prescriptions for her until she can research and establish with a provider in [place].   Pt is mostly concerned with the [facility]. Pt is also concerned with Pepcid script:  States that she has just realized that [facility] gave her a 21m pills so she was just taking one a day instead of 2 251mpills twice daily and she has noticed nighttime heartburn is back. Pt doesn't need any at this point just wants to have clear understanding going forward.

## 2018-12-13 NOTE — Telephone Encounter (Signed)
See note

## 2018-12-14 ENCOUNTER — Other Ambulatory Visit: Payer: Self-pay | Admitting: Family Medicine

## 2018-12-14 DIAGNOSIS — I10 Essential (primary) hypertension: Secondary | ICD-10-CM

## 2018-12-14 DIAGNOSIS — E890 Postprocedural hypothyroidism: Secondary | ICD-10-CM

## 2018-12-14 NOTE — Telephone Encounter (Signed)
Rx request Synthroid  And Quinapril/Hydrochlorothiazide Both Last filled 06/17/18  #90/1 Last OV 09/19/18  w/[place]

## 2018-12-14 NOTE — Telephone Encounter (Signed)
Ok to refill? Rx request Synthroid  And Quinapril/Hydrochlorothiazide Both Last filled 06/17/18  #90/1 Last OV 09/19/18  w/[place]

## 2018-12-16 ENCOUNTER — Other Ambulatory Visit: Payer: Self-pay | Admitting: Family Medicine

## 2018-12-16 DIAGNOSIS — I10 Essential (primary) hypertension: Secondary | ICD-10-CM

## 2018-12-21 ENCOUNTER — Other Ambulatory Visit: Payer: Self-pay | Admitting: Family Medicine

## 2019-01-10 ENCOUNTER — Ambulatory Visit: Payer: Managed Care, Other (non HMO) | Admitting: Rheumatology

## 2019-01-30 ENCOUNTER — Telehealth: Payer: Self-pay | Admitting: Family Medicine

## 2019-01-30 ENCOUNTER — Other Ambulatory Visit: Payer: Self-pay

## 2019-01-30 DIAGNOSIS — J320 Chronic maxillary sinusitis: Secondary | ICD-10-CM

## 2019-01-30 DIAGNOSIS — G8929 Other chronic pain: Secondary | ICD-10-CM

## 2019-01-30 DIAGNOSIS — E890 Postprocedural hypothyroidism: Secondary | ICD-10-CM

## 2019-01-30 DIAGNOSIS — I10 Essential (primary) hypertension: Secondary | ICD-10-CM

## 2019-01-30 DIAGNOSIS — M797 Fibromyalgia: Secondary | ICD-10-CM

## 2019-01-30 DIAGNOSIS — M5441 Lumbago with sciatica, right side: Secondary | ICD-10-CM

## 2019-01-30 MED ORDER — TRAMADOL HCL 50 MG PO TABS: 100.0000 mg | ORAL_TABLET | Freq: Three times a day (TID) | ORAL | 0 refills | Status: AC

## 2019-01-30 MED ORDER — MONTELUKAST SODIUM 10 MG PO TABS: 10.0000 mg | ORAL_TABLET | Freq: Every day | ORAL | 0 refills | Status: AC

## 2019-01-30 MED ORDER — LEVOTHYROXINE SODIUM 100 MCG PO TABS: 100.0000 ug | ORAL_TABLET | Freq: Every day | ORAL | 3 refills | Status: AC

## 2019-01-30 MED ORDER — QUINAPRIL-HYDROCHLOROTHIAZIDE 20-12.5 MG PO TABS: 1.0000 | ORAL_TABLET | Freq: Every day | ORAL | 3 refills | Status: AC

## 2019-01-30 NOTE — Telephone Encounter (Signed)
Levothyroxine and  Accuretic  Last fill 12/14/18  #90/3 Singular  10/06/18  #90/0 Tramadol 10/25/18  #540/0 Last ov 09/19/18 W/[place]

## 2019-01-30 NOTE — Telephone Encounter (Signed)
Deplicate.  I sent this in a refill medication request encounter.

## 2019-01-30 NOTE — Telephone Encounter (Signed)
Medication Refill - Medication:  quinapril-hydrochlorothiazide (ACCURETIC) 20-12.5 MG tablet montelukast (SINGULAIR) 10 MG tablet levothyroxine (SYNTHROID) 100 MCG tablet  traMADol (ULTRAM) 50 MG tablet  Has the patient contacted their pharmacy? Yes advised to call PCP.  Preferred Pharmacy (with phone number or street name):  [facility] [place] [address]  Agent: Please be advised that RX refills may take up to 3 business days. We ask that you follow-up with your pharmacy.

## 2019-03-09 ENCOUNTER — Telehealth: Payer: Self-pay | Admitting: Physical Therapy

## 2019-03-09 NOTE — Telephone Encounter (Signed)
Called patient ROI mailed to [place] [id number] Patient will fill out and send back so that we can give reports. She does not have way to get fax or print from my chart.

## 2019-03-09 NOTE — Telephone Encounter (Signed)
Copied from [facility] [id number]. Topic: General - Other >> Mar 09, 2019  9:22 AM Leward Quan A wrote: Reason for CRM: Raquel Sarna with [facility] in [place] called back to say that the Physician would like the most recent MRI of the brain results faxed over to them please. Ph# [phone] Fax# [phone]

## 2019-04-26 ENCOUNTER — Other Ambulatory Visit: Payer: Self-pay | Admitting: Family Medicine

## 2019-04-26 DIAGNOSIS — J320 Chronic maxillary sinusitis: Secondary | ICD-10-CM

## 2019-07-10 ENCOUNTER — Ambulatory Visit: Payer: Self-pay | Admitting: Rheumatology

## 2019-11-25 IMAGING — MR MR HEAD WO/W CM
13 series · 48 of 48 positions shown · IV contrast (12 ML MULTIHANCE)
Comparison: 08/20/2017

CLINICAL DATA: Speech difficulty, confusion, and difficulty walking
since 08/19/2017. Complex pineal cyst on recent noncontrast brain
MRI.

EXAM:
MRI HEAD WITHOUT AND WITH CONTRAST
TECHNIQUE: Multiplanar, multiecho pulse sequences of the brain and surrounding
structures were obtained without and with intravenous contrast.
CONTRAST:  13mL MULTIHANCE GADOBENATE DIMEGLUMINE 529 MG/ML IV SOLN

[Series 6: T1 · sagittal · 4.0mm · 0.75mm/px · 2 of 29 slices shown (1 of 3)]
[im 1/29]
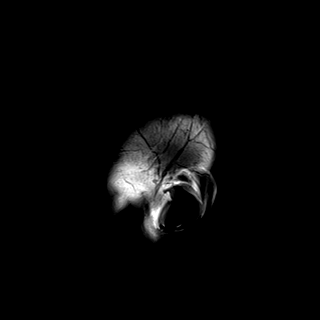
[im 29/29]
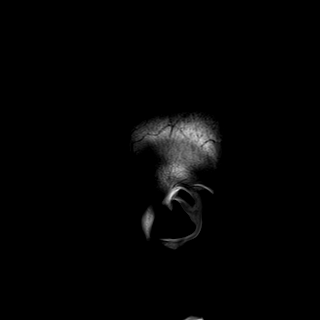

[Series 7: DWI · axial · 3.0mm · 1.44mm/px · z∈[-82,+66]mm · 5 of 92 slices shown (1 of 4)]
[im 1/92]
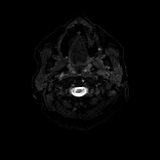
[im 23/92]
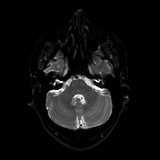
[im 46/92]
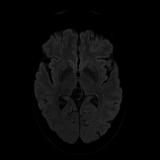
[im 69/92]
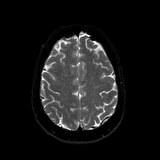
[im 92/92]
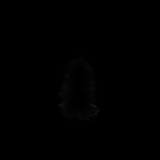

[Series 8: DWI · axial · 3.0mm · 1.44mm/px · z∈[-82,+66]mm · 3 of 45 slices shown (2 of 4)]
[im 1/45]
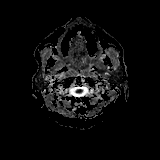
[im 23/45]
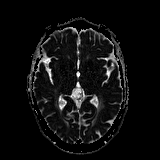
[im 45/45]
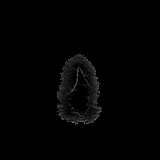

[Series 9: DWI · coronal · 5.0mm · 1.44mm/px · 4 of 66 slices shown (3 of 4)]
[im 1/66]
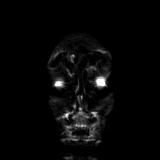
[im 22/66]
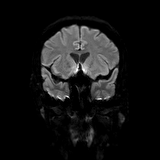
[im 44/66]
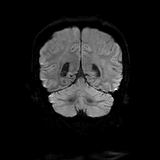
[im 66/66]
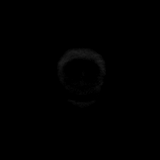

[Series 10: DWI · coronal · 5.0mm · 1.44mm/px · 2 of 33 slices shown (4 of 4)]
[im 1/33]
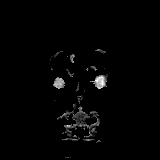
[im 33/33]
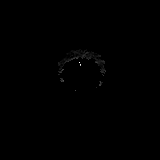

[Series 11: T2 · axial · 4.0mm · 0.36mm/px · z∈[-86,+64]mm · 2 of 30 slices shown]
[im 1/30]
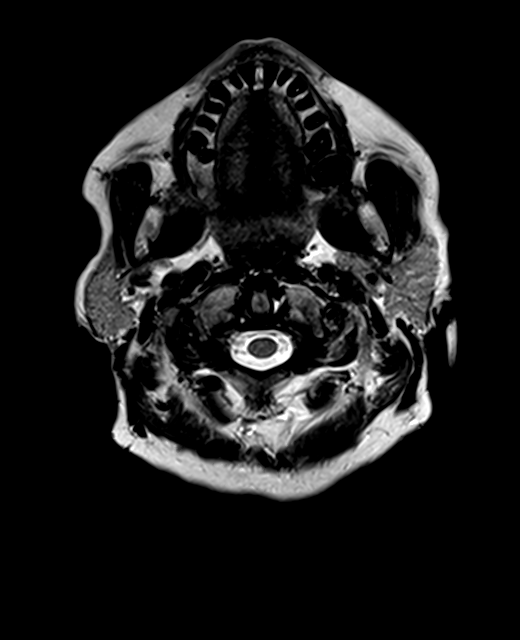
[im 30/30]
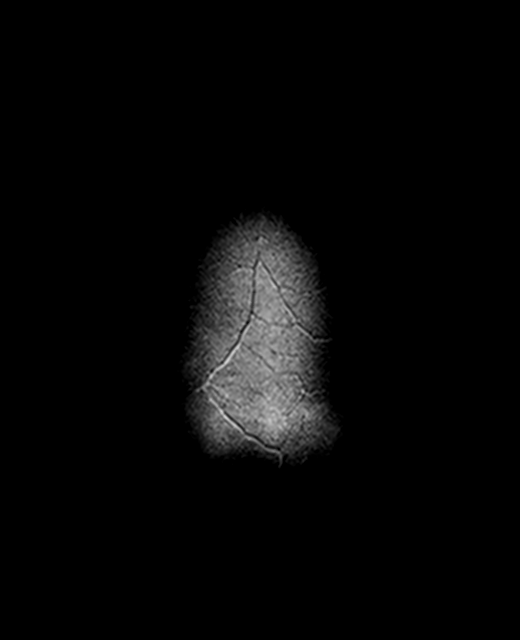

[Series 12: FLAIR · axial · 3.0mm · 0.72mm/px · 1 of 26 slices shown]
[im 1/26]
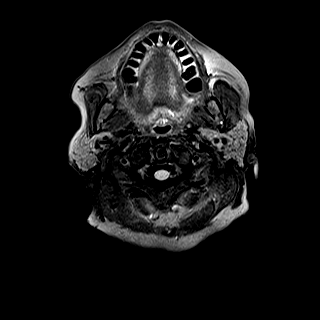

[Series 14: swi_images · axial · 1.5mm · 0.90mm/px · z∈[-82,+60]mm · 5 of 96 slices shown]
[im 1/96]
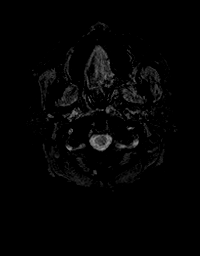
[im 24/96]
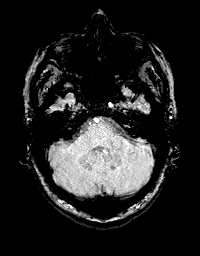
[im 48/96]
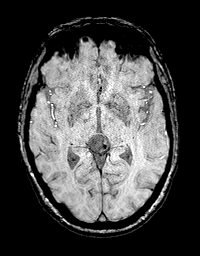
[im 72/96]
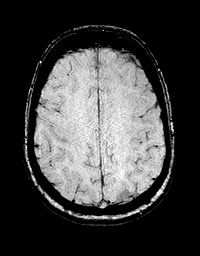
[im 96/96]
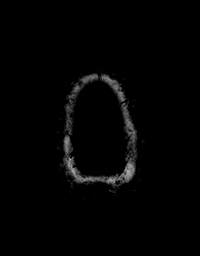

[Series 15: T1 · axial · 1.0mm · 0.90mm/px · z∈[-91,+68]mm · 9 of 160 slices shown (2 of 3)]
[im 1/160]
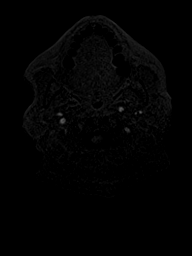
[im 20/160]
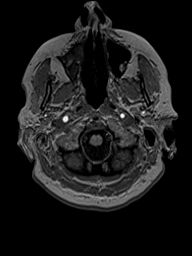
[im 40/160]
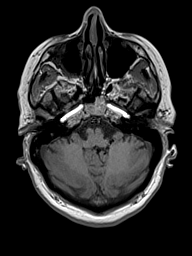
[im 60/160]
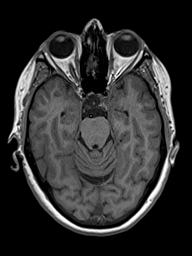
[im 80/160]
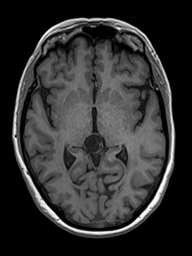
[im 100/160]
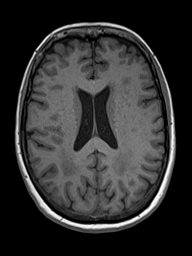
[im 120/160]
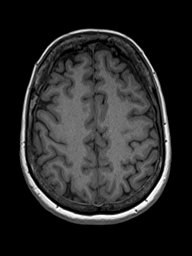
[im 140/160]
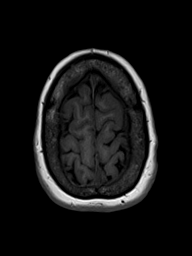
[im 160/160]
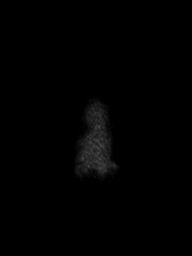

[Series 16: T2 post-contrast · coronal · 4.0mm · 0.36mm/px · 2 of 38 slices shown]
[im 1/38]
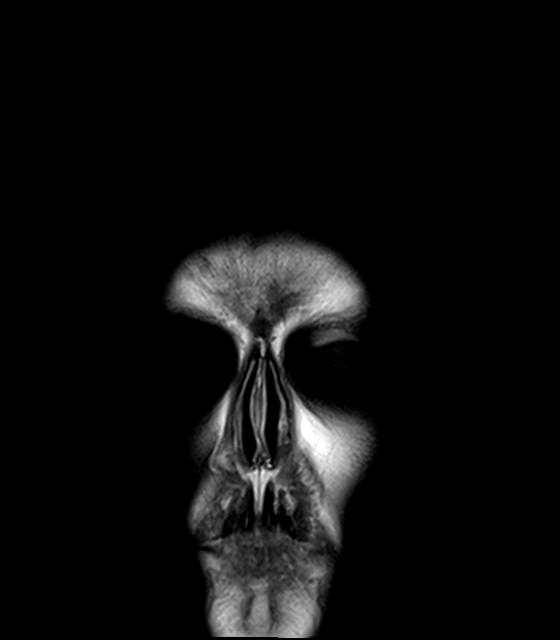
[im 38/38]
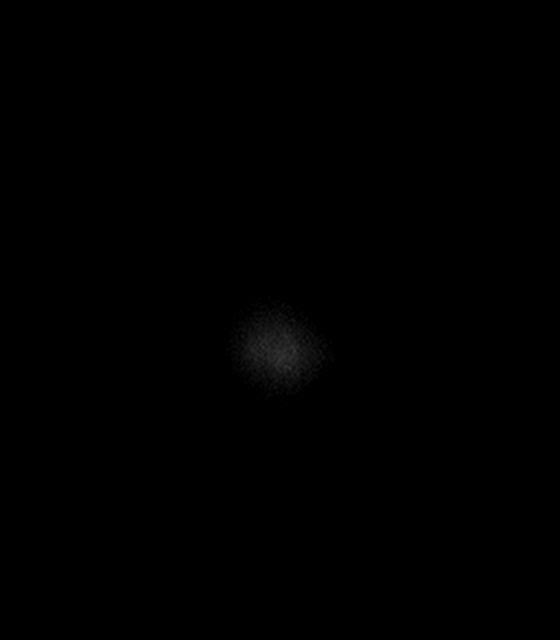

[Series 17: T1 post-contrast · coronal · 4.0mm · 0.72mm/px · 2 of 38 slices shown (1 of 2)]
[im 1/38]
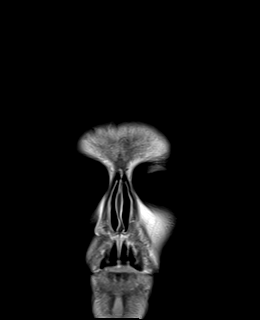
[im 38/38]
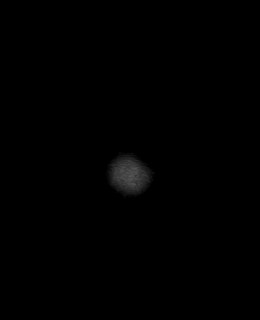

[Series 18: T1 post-contrast · sagittal · 4.0mm · 0.75mm/px · 2 of 31 slices shown (2 of 2)]
[im 1/31]
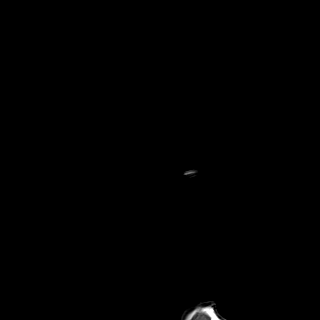
[im 31/31]
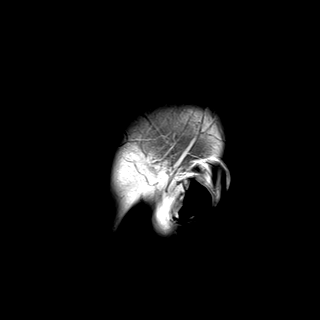

[Series 19: T1 · axial · 1.0mm · 0.90mm/px · z∈[-91,+68]mm · 9 of 160 slices shown (3 of 3)]
[im 1/160]
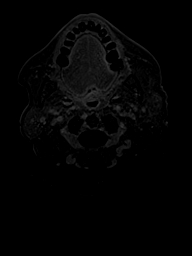
[im 20/160]
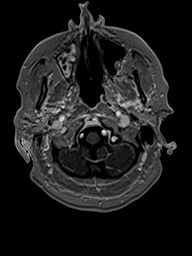
[im 40/160]
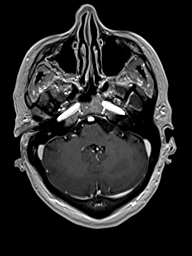
[im 60/160]
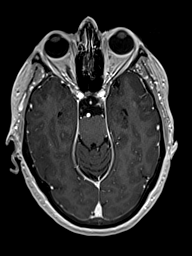
[im 80/160]
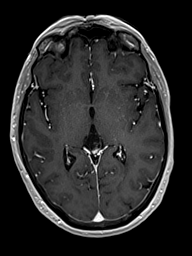
[im 100/160]
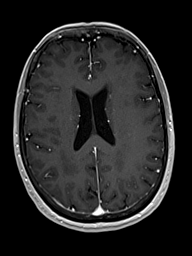
[im 120/160]
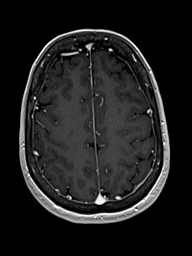
[im 140/160]
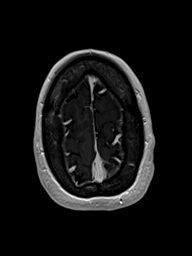
[im 160/160]
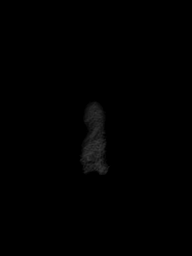

[48 of 48 positions shown; findings below may reference images not displayed]

FINDINGS: Brain: There is no evidence of acute infarct, intracranial
hemorrhage, midline shift, or extra-axial fluid collection. The
ventricles and sulci are normal. Patchy T2 hyperintensities in the
cerebral white matter are unchanged from the recent prior
examination and nonspecific but compatible with mild-to-moderate
chronic small vessel ischemic disease. A pineal cyst is again seen
measuring 14 x 12 x 19 mm. There is a small amount of curvilinear
peripheral enhancement along its posterosuperior margin which
measures 1-1.5 mm in thickness. No truly nodular enhancement is
present. As described on the prior MRI, there is mild mass effect on
the tectum without obstructive hydrocephalus. No abnormal
intracranial enhancement is identified elsewhere.

Vascular: Major intracranial vascular flow voids are preserved.

Skull and upper cervical spine: Unremarkable bone marrow signal.

Sinuses/Orbits: Unremarkable orbits. Mild mucosal thickening and
small volume fluid in the right maxillary sinus, decreased from
prior. Small right mastoid effusion, unchanged.

Other: None.
IMPRESSION: 1. 19 mm pineal cyst with incomplete non-masslike peripheral
enhancement. Mild mass effect on the tectum without hydrocephalus.
Given its size, recommend follow-up brain MRI in 1 year.
2. Mild-to-moderate chronic small vessel ischemic disease.
3. No acute intracranial abnormality.

## 2020-01-17 ENCOUNTER — Other Ambulatory Visit: Payer: Self-pay | Admitting: Family Medicine

## 2020-01-17 DIAGNOSIS — I10 Essential (primary) hypertension: Secondary | ICD-10-CM

## 2020-01-17 DIAGNOSIS — E890 Postprocedural hypothyroidism: Secondary | ICD-10-CM

## 2020-01-17 NOTE — Telephone Encounter (Signed)
Please review
